# Patient Record
Sex: Male | Born: 1935
Health system: Southern US, Community
[De-identification: ages and names within clinical notes are randomized; demographics above are authoritative.]

## PROBLEM LIST (undated history)

## (undated) DIAGNOSIS — Z87898 Personal history of other specified conditions: Secondary | ICD-10-CM

## (undated) DIAGNOSIS — M5030 Other cervical disc degeneration, unspecified cervical region: Secondary | ICD-10-CM

## (undated) DIAGNOSIS — R001 Bradycardia, unspecified: Secondary | ICD-10-CM

## (undated) DIAGNOSIS — D494 Neoplasm of unspecified behavior of bladder: Secondary | ICD-10-CM

## (undated) DIAGNOSIS — I10 Essential (primary) hypertension: Secondary | ICD-10-CM

## (undated) DIAGNOSIS — K449 Diaphragmatic hernia without obstruction or gangrene: Secondary | ICD-10-CM

## (undated) DIAGNOSIS — E785 Hyperlipidemia, unspecified: Secondary | ICD-10-CM

## (undated) DIAGNOSIS — K4090 Unilateral inguinal hernia, without obstruction or gangrene, not specified as recurrent: Secondary | ICD-10-CM

## (undated) DIAGNOSIS — I480 Paroxysmal atrial fibrillation: Secondary | ICD-10-CM

## (undated) DIAGNOSIS — M1990 Unspecified osteoarthritis, unspecified site: Secondary | ICD-10-CM

## (undated) DIAGNOSIS — I4891 Unspecified atrial fibrillation: Secondary | ICD-10-CM

## (undated) DIAGNOSIS — D649 Anemia, unspecified: Secondary | ICD-10-CM

## (undated) DIAGNOSIS — K635 Polyp of colon: Secondary | ICD-10-CM

## (undated) DIAGNOSIS — K5790 Diverticulosis of intestine, part unspecified, without perforation or abscess without bleeding: Secondary | ICD-10-CM

## (undated) DIAGNOSIS — H3530 Unspecified macular degeneration: Secondary | ICD-10-CM

## (undated) DIAGNOSIS — J449 Chronic obstructive pulmonary disease, unspecified: Secondary | ICD-10-CM

## (undated) DIAGNOSIS — K279 Peptic ulcer, site unspecified, unspecified as acute or chronic, without hemorrhage or perforation: Secondary | ICD-10-CM

## (undated) DIAGNOSIS — K7689 Other specified diseases of liver: Secondary | ICD-10-CM

## (undated) DIAGNOSIS — K219 Gastro-esophageal reflux disease without esophagitis: Secondary | ICD-10-CM

## (undated) DIAGNOSIS — Z7902 Long term (current) use of antithrombotics/antiplatelets: Secondary | ICD-10-CM

## (undated) DIAGNOSIS — E782 Mixed hyperlipidemia: Secondary | ICD-10-CM

## (undated) DIAGNOSIS — Z7901 Long term (current) use of anticoagulants: Secondary | ICD-10-CM

## (undated) DIAGNOSIS — I700 Atherosclerosis of aorta: Secondary | ICD-10-CM

## (undated) DIAGNOSIS — N400 Enlarged prostate without lower urinary tract symptoms: Secondary | ICD-10-CM

## (undated) HISTORY — PX: TONSILLECTOMY: SUR1361

## (undated) HISTORY — PX: OTHER SURGICAL HISTORY: SHX169

## (undated) HISTORY — PX: CHOLECYSTECTOMY: SHX55

## (undated) HISTORY — PX: COLONOSCOPY: SHX174

---

## 2005-09-20 ENCOUNTER — Emergency Department: Payer: Self-pay | Admitting: Emergency Medicine

## 2006-11-17 ENCOUNTER — Ambulatory Visit: Payer: Self-pay | Admitting: Gastroenterology

## 2014-04-07 DIAGNOSIS — E538 Deficiency of other specified B group vitamins: Secondary | ICD-10-CM | POA: Insufficient documentation

## 2014-04-07 DIAGNOSIS — D649 Anemia, unspecified: Secondary | ICD-10-CM | POA: Insufficient documentation

## 2014-04-07 DIAGNOSIS — J449 Chronic obstructive pulmonary disease, unspecified: Secondary | ICD-10-CM | POA: Insufficient documentation

## 2014-04-07 DIAGNOSIS — E785 Hyperlipidemia, unspecified: Secondary | ICD-10-CM | POA: Insufficient documentation

## 2014-04-07 DIAGNOSIS — I10 Essential (primary) hypertension: Secondary | ICD-10-CM | POA: Insufficient documentation

## 2014-05-08 ENCOUNTER — Ambulatory Visit: Payer: Self-pay | Admitting: Internal Medicine

## 2014-05-17 ENCOUNTER — Ambulatory Visit: Payer: Self-pay | Admitting: Surgery

## 2014-05-25 ENCOUNTER — Ambulatory Visit: Payer: Self-pay | Admitting: Surgery

## 2014-05-26 LAB — PATHOLOGY REPORT

## 2014-12-19 ENCOUNTER — Inpatient Hospital Stay: Payer: Self-pay | Admitting: Internal Medicine

## 2014-12-19 LAB — COMPREHENSIVE METABOLIC PANEL
ALBUMIN: 3.5 g/dL (ref 3.4–5.0)
ALK PHOS: 110 U/L
Anion Gap: 7 (ref 7–16)
BUN: 17 mg/dL (ref 7–18)
Bilirubin,Total: 0.3 mg/dL (ref 0.2–1.0)
CALCIUM: 8.9 mg/dL (ref 8.5–10.1)
Chloride: 105 mmol/L (ref 98–107)
Co2: 30 mmol/L (ref 21–32)
Creatinine: 1.05 mg/dL (ref 0.60–1.30)
EGFR (African American): >60
EGFR (Non-African Amer.): >60
Glucose: 97 mg/dL (ref 65–99)
OSMOLALITY: 285 (ref 275–301)
Potassium: 3.7 mmol/L (ref 3.5–5.1)
SGOT(AST): 28 U/L (ref 15–37)
SGPT (ALT): 25 U/L
SODIUM: 142 mmol/L (ref 136–145)
TOTAL PROTEIN: 7.5 g/dL (ref 6.4–8.2)

## 2014-12-19 LAB — CBC
HCT: 48.1 % (ref 40.0–52.0)
HGB: 15.2 g/dL (ref 13.0–18.0)
MCH: 26.8 pg (ref 26.0–34.0)
MCHC: 31.7 g/dL — AB (ref 32.0–36.0)
MCV: 85 fL (ref 80–100)
Platelet: 219 10*3/uL (ref 150–440)
RBC: 5.69 10*6/uL (ref 4.40–5.90)
RDW: 15.5 % — ABNORMAL HIGH (ref 11.5–14.5)
WBC: 8.2 10*3/uL (ref 3.8–10.6)

## 2014-12-19 LAB — CK TOTAL AND CKMB (NOT AT ARMC)
CK, Total: 168 U/L (ref 39–308)
CK-MB: 4.6 ng/mL — ABNORMAL HIGH (ref 0.5–3.6)

## 2014-12-19 LAB — PROTIME-INR
INR: 0.9
Prothrombin Time: 11.9 secs (ref 11.5–14.7)

## 2014-12-19 LAB — TSH: THYROID STIMULATING HORM: 4.85 u[IU]/mL — AB

## 2014-12-19 LAB — T4, FREE: FREE THYROXINE: 1.1 ng/dL (ref 0.76–1.46)

## 2014-12-19 LAB — TROPONIN I
TROPONIN-I: 0.02 ng/mL
Troponin-I: 0.02 ng/mL
Troponin-I: 0.02 ng/mL

## 2014-12-19 LAB — APTT: ACTIVATED PTT: 28.4 s (ref 23.6–35.9)

## 2014-12-19 LAB — HEPARIN LEVEL (UNFRACTIONATED): ANTI-XA(UNFRACTIONATED): 0.39 [IU]/mL (ref 0.30–0.70)

## 2014-12-19 IMAGING — CR DG CHEST 1V PORT
1 series · 1 of 1 positions shown · non-contrast
Comparison: None.

CLINICAL DATA: Tachycardia since this morning. Weakness. Former
smoker.

EXAM:
PORTABLE CHEST - 1 VIEW

[ap]
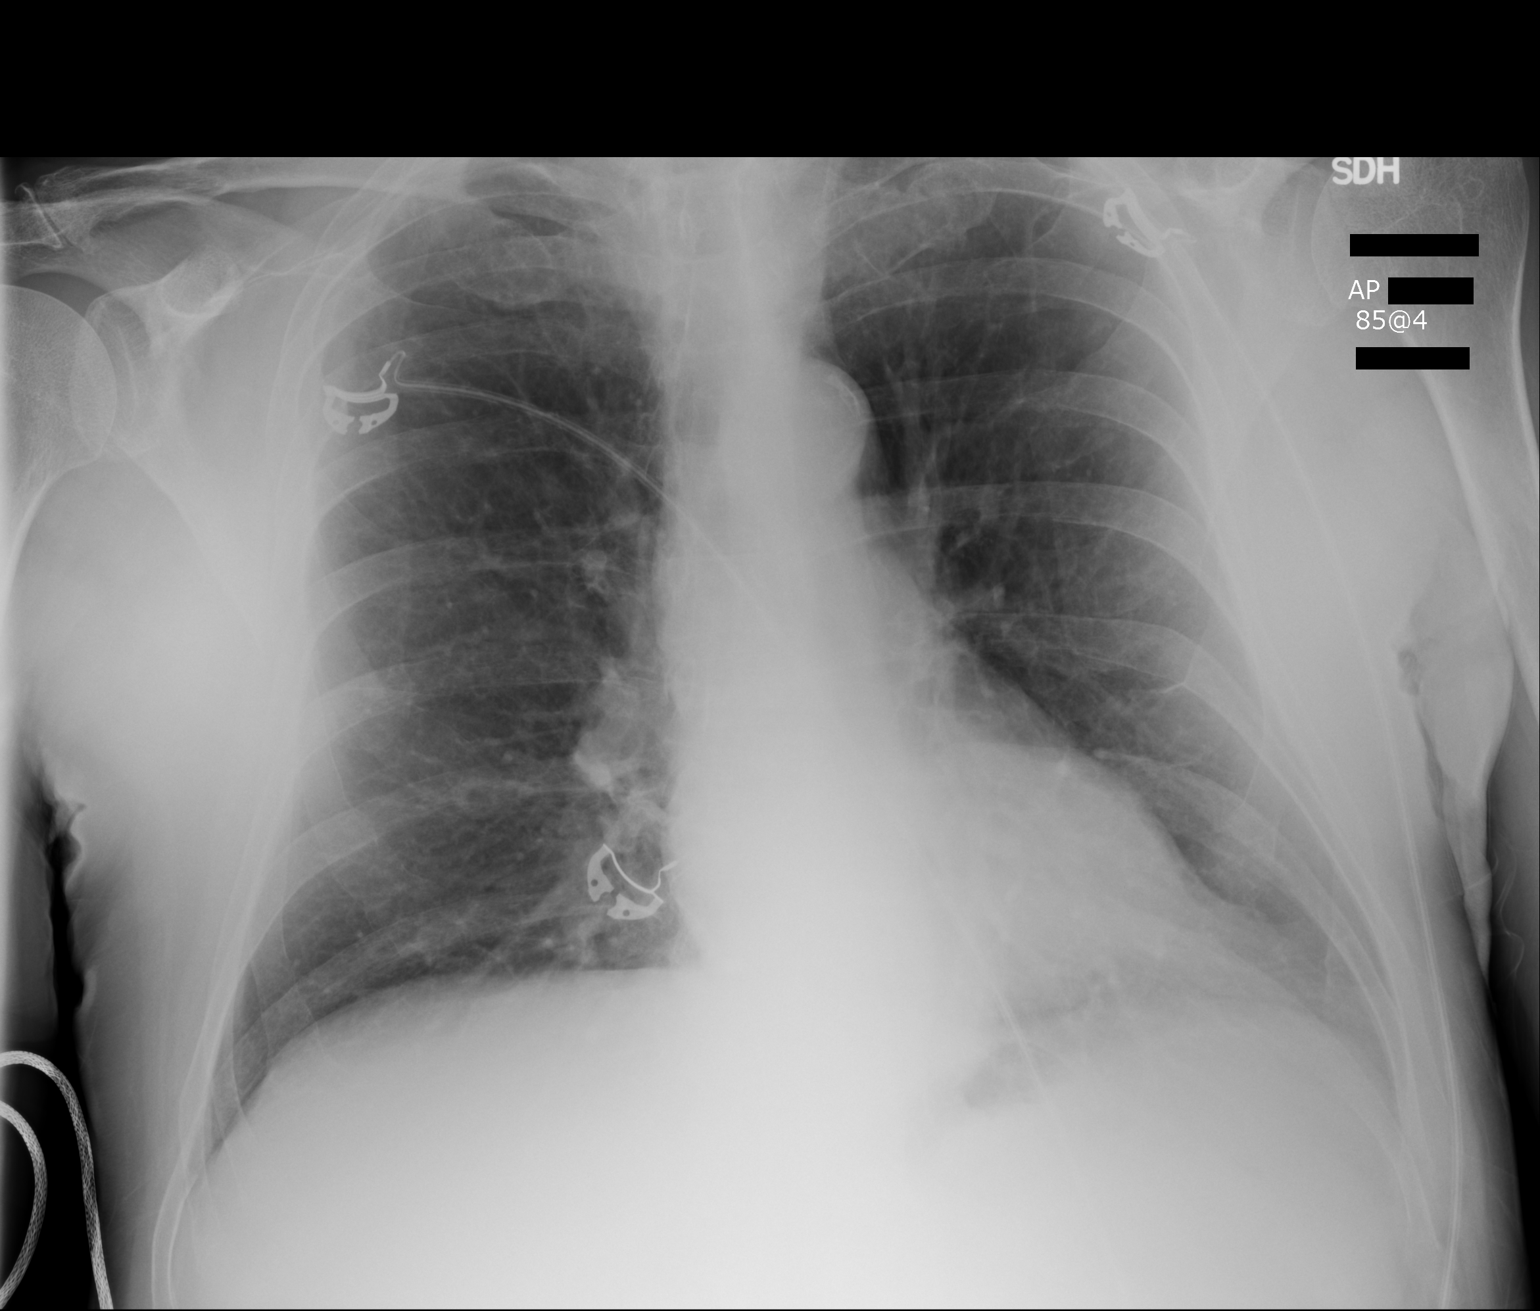

[1 of 1 positions shown; findings below may reference images not displayed]

FINDINGS: The heart size and mediastinal contours are within normal limits.
Both lungs are clear. The visualized skeletal structures are
unremarkable.
IMPRESSION: No active disease.

## 2014-12-20 LAB — CBC WITH DIFFERENTIAL/PLATELET
BASOS ABS: 0.1 10*3/uL (ref 0.0–0.1)
BASOS PCT: 0.6 %
EOS ABS: 0.4 10*3/uL (ref 0.0–0.7)
Eosinophil %: 4.7 %
HCT: 40.7 % (ref 40.0–52.0)
HGB: 13.2 g/dL (ref 13.0–18.0)
Lymphocyte #: 2.5 10*3/uL (ref 1.0–3.6)
Lymphocyte %: 31.9 %
MCH: 26.8 pg (ref 26.0–34.0)
MCHC: 32.4 g/dL (ref 32.0–36.0)
MCV: 83 fL (ref 80–100)
Monocyte #: 0.7 x10 3/mm (ref 0.2–1.0)
Monocyte %: 9.5 %
NEUTROS ABS: 4.1 10*3/uL (ref 1.4–6.5)
Neutrophil %: 53.3 %
Platelet: 173 10*3/uL (ref 150–440)
RBC: 4.92 10*6/uL (ref 4.40–5.90)
RDW: 15.3 % — ABNORMAL HIGH (ref 11.5–14.5)
WBC: 7.8 10*3/uL (ref 3.8–10.6)

## 2014-12-20 LAB — BASIC METABOLIC PANEL
Anion Gap: 6 — ABNORMAL LOW (ref 7–16)
BUN: 16 mg/dL (ref 7–18)
CHLORIDE: 109 mmol/L — AB (ref 98–107)
CO2: 27 mmol/L (ref 21–32)
CREATININE: 0.96 mg/dL (ref 0.60–1.30)
Calcium, Total: 8.2 mg/dL — ABNORMAL LOW (ref 8.5–10.1)
EGFR (African American): >60
EGFR (Non-African Amer.): >60
Glucose: 104 mg/dL — ABNORMAL HIGH (ref 65–99)
Osmolality: 285 (ref 275–301)
POTASSIUM: 3.6 mmol/L (ref 3.5–5.1)
Sodium: 142 mmol/L (ref 136–145)

## 2014-12-20 LAB — HEPARIN LEVEL (UNFRACTIONATED): Anti-Xa(Unfractionated): 0.33 IU/mL (ref 0.30–0.70)

## 2014-12-20 LAB — MAGNESIUM: Magnesium: 1.8 mg/dL

## 2014-12-21 LAB — BASIC METABOLIC PANEL
ANION GAP: 7 (ref 7–16)
BUN: 17 mg/dL (ref 7–18)
CALCIUM: 8 mg/dL — AB (ref 8.5–10.1)
Chloride: 110 mmol/L — ABNORMAL HIGH (ref 98–107)
Co2: 25 mmol/L (ref 21–32)
Creatinine: 1 mg/dL (ref 0.60–1.30)
GLUCOSE: 96 mg/dL (ref 65–99)
OSMOLALITY: 285 (ref 275–301)
POTASSIUM: 3.4 mmol/L — AB (ref 3.5–5.1)
Sodium: 142 mmol/L (ref 136–145)

## 2014-12-28 DIAGNOSIS — I480 Paroxysmal atrial fibrillation: Secondary | ICD-10-CM | POA: Insufficient documentation

## 2015-03-24 NOTE — Op Note (Signed)
PATIENT NAME:  Darren Wright, Darren Wright MR#:  [id number] DATE OF BIRTH:  June 28, 1929  DATE OF PROCEDURE:  05/25/2014  PREOPERATIVE DIAGNOSIS: Chronic cholecystitis/cholelithiasis.   POSTOPERATIVE DIAGNOSIS:  Chronic cholecystitis/cholelithiasis.  PROCEDURES: Laparoscopic cholecystectomy and cholangiogram.   SURGEON: Rochel Brome, M.D.   ANESTHESIA: General.   INDICATIONS: This 81 year old male has had 5 episodes of epigastric pain and ultrasound findings of gallstones. Surgery was recommended for definitive treatment.   DESCRIPTION OF PROCEDURE: The patient was placed on the operating table in the supine position under general endotracheal anesthesia. The abdomen was prepared with ChloraPrep and draped in a sterile manner.   A short incision was made in the inferior aspect of the umbilicus and carried down to the deep fascia, which was grasped with laryngeal hook and elevated. A Veress needle was inserted, aspirated, and irrigated with a saline solution.  Next, the peritoneal cavity was inflated with carbon dioxide. The Veress needle was removed. The 10 mm cannula was inserted. The 10 mm, 0 degrees laparoscope was inserted to view the peritoneal cavity. The liver appeared normal. Brief survey revealed no other abnormality. Another incision was made in the epigastrium, slightly to the right of the midline to introduce an 11 mm cannula. Two 5 mm cannulas were inserted through small incisions in the lateral aspect of the right upper quadrant.   With the patient in the reverse Trendelenburg position and turned several degrees to the left, the gallbladder was retracted towards the right shoulder. A number of adhesions between the gallbladder and the liver where were dissected with hook and cautery. The infundibulum was retracted inferiorly and laterally. The location of the porta hepatis was demonstrated. Dissection was carried out to mobilize the neck of the gallbladder with incision on the visceral peritoneum.  The cystic duct was dissected free from surrounding structures. The cystic artery was dissected free from surrounding structures. A critical view of safety was demonstrated. Endoclips were placed on the cystic artery and divided. This allowed better traction on the cystic duct. An endoclip was placed across the cystic duct. An incision was made in the cystic duct to introduce a Reddick catheter. Half-strength Conray-60 dye was injected as the cholangiogram was done with fluoroscopy. There was some diffuse dilatation of the common bile duct and some reflux into the biliary tree. There was prompt flow of dye into the duodenum. No retained stones were seen. The Reddick catheter was removed. The cystic duct was doubly ligated with endoclips and divided. The gallbladder was dissected free from the liver with hook and cautery. The gallbladder was delivered up through the infraumbilical incision, opened and suctioned. A number of stones were removed with the stone forceps. The gallbladder was removed and submitted in formalin with stones for routine pathology. The right upper quadrant was further inspected, irrigated, and aspirated. Hemostasis was intact. The cannulas were removed allowing carbon dioxide to escape from the peritoneal cavity. Skin incisions were closed with interrupted 5-0 chromic subcuticular sutures, benzoin, and Steri-Strips. Dressings were applied with paper tape. The patient tolerated surgery satisfactorily and was then prepared for transfer to the recovery room.   ____________________________ Lenna Sciara. Rochel Brome, MD jws:ts D: 05/25/2014 08:51:00 ET T: 05/25/2014 13:36:18 ET JOB#: [id number]  cc: Loreli Dollar, MD, <Dictator> Loreli Dollar MD ELECTRONICALLY SIGNED 05/25/2014 18:18

## 2015-04-01 NOTE — H&P (Signed)
PATIENT NAME:  Darren Wright, Darren Wright MR#:  [id number] DATE OF BIRTH:  06-28-1929  DATE OF ADMISSION:  12/19/2014  PRIMARY CARE PHYSICIAN:  Felipa Furnace, MD   REQUESTING PHYSICIAN: Lenise Arena, MD.   CHIEF COMPLAINT:  Palpitation.   HISTORY OF PRESENT ILLNESS: The patient is an 81 year old male with a known history of hypertension, is being admitted for new onset atrial fibrillation-flutter.  The patient woke up this morning with a feeling of hot racing, he could feel the pulse in his head, checked his blood pressure and oxygen, and heart rate.  His heart rate normally is between 50 to 60, but this morning it was around 140. His oxygen level was at 98. His blood pressure was stable. His wife drove him to the Emergency Department considering such a fast heart rate as normal he is in the 50s and 70s.  While in the ED, he was still 137 to 138 per minute, and he is being admitted for further evaluation and management.  He received 10 mg of Cardizem IV first around 9:30 in the Emergency Department, still did not change, got another 20 mg IV Cardizem push around 9:45 which did control his rate and he is now being admitted for further evaluation and management. He denies any chest pain or shortness of breath.   PAST MEDICAL HISTORY:  Hypertension.   PAST SURGICAL HISTORY:  Cholecystectomy about 3 months ago.   SOCIAL HISTORY: No smoking, he quit smoking in 1985.  No alcohol. He used to work as a Dance movement psychotherapist.   FAMILY HISTORY: Father with a history of prostate cancer. Mother with a history of pancreatic cancer.   ALLERGIES: IBUPROFEN AND PENICILLIN.   MEDICATIONS AT HOME:  1.  Aspirin 81 mg p.o. daily. 2.  Benazepril-hydrochlorothiazide 20/12.5 mg 1 tablet p.o. b.i.d.  3.  Dry Eye ophthalmic solution 1 drop to each affected eye 4 times a day as needed.  4.  Metoprolol 50 mg p.o. daily.  5.  PreserVision 1 capsule p.o. b.i.d.  6.  Timolol eye drops 0.5% to each affected eye daily.  7.   Vitamin B12 at 1000 mcg p.o. daily.    REVIEW OF SYSTEMS:  CONSTITUTIONAL: No fever, fatigue, weakness.  EYES: No blurred or double vision.  ENT: No tinnitus or ear pain.  RESPIRATORY: No cough, wheezing, hemoptysis.  CARDIOVASCULAR: No chest pain, orthopnea, edema. Positive for palpitations.  GASTROINTESTINAL: No nausea, vomiting, diarrhea.  GENITOURINARY:  No dysuria, hematuria. ENDOCRINE:  No polyuria, nocturia.   HEMATOLOGY:  No easy bruising or bleeding. SKIN: No rash or lesion.  MUSCULOSKELETAL: No arthritis or muscle cramp.  NEUROLOGIC: No tingling, numbness, weakness.  PSYCHIATRY: No history of anxiety or depression.   PHYSICAL EXAMINATION:  VITAL SIGNS: Temperature 97.5, heart rate was up to 137 per minute, now it is about 95 per minute, respirations 20 per minute, blood pressure 129/71. He is saturating 99% on room air.  GENERAL: The patient is an 81 year old male lying in the bed comfortably without any acute distress.  EYES: Pupils equal, round, reactive to light and accommodation. No scleral icterus. Extraocular muscles intact.  HEENT: Head atraumatic, normocephalic. Oropharynx and nasopharynx clear.  NECK: Supple.  No JVD, No thyroid enalargement or tenderness  LUNGS: Clear to auscultation bilaterally. No wheezing, rales, rhonchi, or crepitation.  CARDIOVASCULAR: Irregularly irregular heart sounds, no murmurs, rubs or gallops.   ABDOMEN: Soft, nontender, nondistended. Bowel sounds present. No organomegaly or mass.  EXTREMITIES: No pedal edema, cyanosis, or clubbing.  NEUROLOGIC: Cranial  nerves II through XII intact, muscle strength 5/5 in all extremities.  Sensation intact.  PSYCHIATRIC: The patient is alert and oriented x 3.   SKIN: No obvious rash, lesion, or ulcer.  MUSCULOSKELETAL: No joint effusion or tenderness. Normal muscle tone.    LABORATORY DATA:  Normal BMP. Normal liver function tests. Normal CBC. Normal first set of cardiac enzymes. TSH was elevated with a  value of 4.85, but free T4, is 1.10.  Coagulation panel within normal limits.   Chest x-ray in the Emergency Department showed no acute cardiopulmonary disease.  EKG initially showed worsening sinus tachycardia, rate of 138 per minute.  Repeat EKG showed atrial flutter with a rate of 104 per minute, is still running in aflutter, but with much controlled rate of the mid-90s.   IMPRESSION AND PLAN:  1.  New onset atrial fibrillation/flutter controlled by 2 pushes of IV Cardizem.  We will go ahead and start him on metoprolol at this time. Consult cardiology Dr. Saralyn Pilar and the case was discussed with him, who will see him this evening.  We will get a 2D echocardiogram, check TSH. Continue on heparin drip, which was already started in the Emergency Department. He may need long-term anticoagulation. We will monitor him on telemetry.  2.  Hypertension. We will continue his home medication, add metoprolol.  3.  Elevated TSH, likely euthyroid sick syndrome as free T4 is within normal limits   CODE STATUS: FULL CODE.   TOTAL TIME TAKING CARE OF THIS PATIENT:  55 minutes.      ____________________________ Lucina Mellow. Manuella Ghazi, MD vss:DT D: 12/19/2014 15:19:54 ET T: 12/19/2014 16:09:39 ET JOB#: [id number]  cc: Saanya Zieske S. Manuella Ghazi, MD, <Dictator> Leonie Douglas. Doy Hutching, MD  Remer Macho MD ELECTRONICALLY SIGNED 12/21/2014 10:14

## 2015-04-01 NOTE — Consult Note (Signed)
PATIENT NAME:  Darren Wright, Darren Wright MR#:  [id number] DATE OF BIRTH:  1929/01/02  DATE OF CONSULTATION:  12/19/2014  PRIMARY CARE PHYSICIAN:  Dr. Doy Hutching.  REFERRING PHYSICIAN:  Dr. Manuella Ghazi.   CONSULTING PHYSICIAN:  Isaias Cowman, MD  CHIEF COMPLAINT:  Palpitations.   REASON FOR CONSULTATION: Consultation requested for evaluation of atrial fibrillation.   HISTORY OF PRESENT ILLNESS: The patient is an 81 year old gentleman with history of hypertension and hyperlipidemia, who presents to [facility] Emergency Room with tachycardia and atrial fibrillation. The patient reports that he was in his usual state of health until early this a.m. when he got up out of bed and noted lightheadedness and palpitations. He presented to [facility] Emergency Room where he was noted to be in atrial fibrillation with a rapid ventricular rate. The patient was started on heparin drip. The patient denies chest pain or shortness of breath. Admission labs were notable for negative troponin of 0.02.   PAST MEDICAL HISTORY:  1.  Hypertension.  2.  Hyperlipidemia.  3.  Gastroesophageal reflux disease.  4.  Benign prostatic hypertrophy.  5.  Degenerative disk disease.  6.  Osteoarthritis.  7.  Chronic obstructive pulmonary disease.   MEDICATIONS: Aspirin 81 mg daily, benazepril 12.5 mg b.i.d., metoprolol succinate 1 tablet daily, cyanocobalamin 1000 mcg daily.   SOCIAL HISTORY: The patient is married and resides with his wife.  He has a remote tobacco abuse history.   FAMILY HISTORY: No immediate family history of coronary artery disease or myocardial infarction.   REVIEW OF SYSTEMS:     CONSTITUTIONAL: No fever or chills.  EYES: No blurry vision.  EARS: No hearing loss.  RESPIRATORY: No shortness of breath.  CARDIOVASCULAR: Palpitations as described above.  GASTROINTESTINAL: No nausea, vomiting, or diarrhea.  GENITOURINARY: No dysuria or hematuria.  ENDOCRINE: No polyuria or polydipsia.  MUSCULOSKELETAL: No arthralgias or  myalgias.  NEUROLOGICAL: No focal muscle weakness or numbness.  PSYCHOLOGICAL: No depression or anxiety.   PHYSICAL EXAMINATION:  VITAL SIGNS: Blood pressure 129/71, pulse 95, respirations 20, temperature 97.5, pulse oximetry 97%.  HEENT: Pupils equal, reactive to light and accommodation.  NECK: Supple without thyromegaly.  LUNGS: Clear.  CARDIOVASCULAR:  Normal JVP. Normal PMI. Regular rate and rhythm. Irregularly irregular rhythm. Normal S1, S2. No appreciable gallop, murmur, or rub.  ABDOMEN: Soft and nontender. Pulses were intact bilaterally.  MUSCULOSKELETAL: Normal muscle tone.  NEUROLOGIC: The patient is alert and oriented x3. Motor and sensory both grossly intact.   IMPRESSION: An 81 year old gentleman, who presents with new onset atrial fibrillation, currently appears hemodynamically stable, without chest pain with negative troponin.   RECOMMENDATIONS:  1.  Agree with overall current therapy.  2.  Resume metoprolol succinate for rate as well as blood pressure control.  3.  Review 2-D echocardiogram.  4.  Further recommendations pending echocardiogram results.     ____________________________ Isaias Cowman, MD ap:at D: 12/19/2014 13:28:33 ET T: 12/19/2014 15:56:41 ET JOB#: [id number]  cc: Isaias Cowman, MD, <Dictator> Isaias Cowman MD ELECTRONICALLY SIGNED 12/20/2014 18:07

## 2015-04-05 DIAGNOSIS — R5382 Chronic fatigue, unspecified: Secondary | ICD-10-CM | POA: Insufficient documentation

## 2015-04-05 DIAGNOSIS — R001 Bradycardia, unspecified: Secondary | ICD-10-CM | POA: Insufficient documentation

## 2015-12-12 DIAGNOSIS — H353212 Exudative age-related macular degeneration, right eye, with inactive choroidal neovascularization: Secondary | ICD-10-CM | POA: Diagnosis not present

## 2016-01-03 DIAGNOSIS — D649 Anemia, unspecified: Secondary | ICD-10-CM | POA: Diagnosis not present

## 2016-01-03 DIAGNOSIS — E7800 Pure hypercholesterolemia, unspecified: Secondary | ICD-10-CM | POA: Diagnosis not present

## 2016-01-03 DIAGNOSIS — E538 Deficiency of other specified B group vitamins: Secondary | ICD-10-CM | POA: Diagnosis not present

## 2016-01-03 DIAGNOSIS — J431 Panlobular emphysema: Secondary | ICD-10-CM | POA: Diagnosis not present

## 2016-01-03 DIAGNOSIS — Z125 Encounter for screening for malignant neoplasm of prostate: Secondary | ICD-10-CM | POA: Diagnosis not present

## 2016-01-03 DIAGNOSIS — Z79899 Other long term (current) drug therapy: Secondary | ICD-10-CM | POA: Diagnosis not present

## 2016-01-03 DIAGNOSIS — I10 Essential (primary) hypertension: Secondary | ICD-10-CM | POA: Diagnosis not present

## 2016-01-09 DIAGNOSIS — H353221 Exudative age-related macular degeneration, left eye, with active choroidal neovascularization: Secondary | ICD-10-CM | POA: Diagnosis not present

## 2016-02-05 DIAGNOSIS — H40003 Preglaucoma, unspecified, bilateral: Secondary | ICD-10-CM | POA: Diagnosis not present

## 2016-02-06 DIAGNOSIS — E538 Deficiency of other specified B group vitamins: Secondary | ICD-10-CM | POA: Diagnosis not present

## 2016-02-06 DIAGNOSIS — I10 Essential (primary) hypertension: Secondary | ICD-10-CM | POA: Diagnosis not present

## 2016-02-06 DIAGNOSIS — J410 Simple chronic bronchitis: Secondary | ICD-10-CM | POA: Diagnosis not present

## 2016-02-06 DIAGNOSIS — Z79899 Other long term (current) drug therapy: Secondary | ICD-10-CM | POA: Diagnosis not present

## 2016-02-06 DIAGNOSIS — E7800 Pure hypercholesterolemia, unspecified: Secondary | ICD-10-CM | POA: Diagnosis not present

## 2016-02-06 DIAGNOSIS — Z125 Encounter for screening for malignant neoplasm of prostate: Secondary | ICD-10-CM | POA: Diagnosis not present

## 2016-02-06 DIAGNOSIS — R001 Bradycardia, unspecified: Secondary | ICD-10-CM | POA: Diagnosis not present

## 2016-02-11 DIAGNOSIS — H40003 Preglaucoma, unspecified, bilateral: Secondary | ICD-10-CM | POA: Diagnosis not present

## 2016-02-18 DIAGNOSIS — H353221 Exudative age-related macular degeneration, left eye, with active choroidal neovascularization: Secondary | ICD-10-CM | POA: Diagnosis not present

## 2016-02-20 DIAGNOSIS — H353212 Exudative age-related macular degeneration, right eye, with inactive choroidal neovascularization: Secondary | ICD-10-CM | POA: Diagnosis not present

## 2016-04-07 DIAGNOSIS — H353221 Exudative age-related macular degeneration, left eye, with active choroidal neovascularization: Secondary | ICD-10-CM | POA: Diagnosis not present

## 2016-04-21 DIAGNOSIS — H353221 Exudative age-related macular degeneration, left eye, with active choroidal neovascularization: Secondary | ICD-10-CM | POA: Diagnosis not present

## 2016-05-02 DIAGNOSIS — N138 Other obstructive and reflux uropathy: Secondary | ICD-10-CM | POA: Diagnosis not present

## 2016-05-02 DIAGNOSIS — N401 Enlarged prostate with lower urinary tract symptoms: Secondary | ICD-10-CM | POA: Diagnosis not present

## 2016-05-02 DIAGNOSIS — J410 Simple chronic bronchitis: Secondary | ICD-10-CM | POA: Diagnosis not present

## 2016-05-02 DIAGNOSIS — Z79899 Other long term (current) drug therapy: Secondary | ICD-10-CM | POA: Diagnosis not present

## 2016-05-02 DIAGNOSIS — E538 Deficiency of other specified B group vitamins: Secondary | ICD-10-CM | POA: Diagnosis not present

## 2016-05-02 DIAGNOSIS — E7800 Pure hypercholesterolemia, unspecified: Secondary | ICD-10-CM | POA: Diagnosis not present

## 2016-05-02 DIAGNOSIS — D649 Anemia, unspecified: Secondary | ICD-10-CM | POA: Diagnosis not present

## 2016-05-02 DIAGNOSIS — I10 Essential (primary) hypertension: Secondary | ICD-10-CM | POA: Diagnosis not present

## 2016-05-02 DIAGNOSIS — Z125 Encounter for screening for malignant neoplasm of prostate: Secondary | ICD-10-CM | POA: Diagnosis not present

## 2016-05-14 DIAGNOSIS — H353221 Exudative age-related macular degeneration, left eye, with active choroidal neovascularization: Secondary | ICD-10-CM | POA: Diagnosis not present

## 2016-06-10 DIAGNOSIS — H353211 Exudative age-related macular degeneration, right eye, with active choroidal neovascularization: Secondary | ICD-10-CM | POA: Diagnosis not present

## 2016-06-13 DIAGNOSIS — H353221 Exudative age-related macular degeneration, left eye, with active choroidal neovascularization: Secondary | ICD-10-CM | POA: Diagnosis not present

## 2016-08-01 DIAGNOSIS — H353221 Exudative age-related macular degeneration, left eye, with active choroidal neovascularization: Secondary | ICD-10-CM | POA: Diagnosis not present

## 2016-08-11 DIAGNOSIS — H353221 Exudative age-related macular degeneration, left eye, with active choroidal neovascularization: Secondary | ICD-10-CM | POA: Diagnosis not present

## 2016-08-11 DIAGNOSIS — H40003 Preglaucoma, unspecified, bilateral: Secondary | ICD-10-CM | POA: Diagnosis not present

## 2016-08-22 DIAGNOSIS — J410 Simple chronic bronchitis: Secondary | ICD-10-CM | POA: Diagnosis not present

## 2016-08-22 DIAGNOSIS — R001 Bradycardia, unspecified: Secondary | ICD-10-CM | POA: Diagnosis not present

## 2016-08-22 DIAGNOSIS — I480 Paroxysmal atrial fibrillation: Secondary | ICD-10-CM | POA: Diagnosis not present

## 2016-08-22 DIAGNOSIS — E7800 Pure hypercholesterolemia, unspecified: Secondary | ICD-10-CM | POA: Diagnosis not present

## 2016-08-22 DIAGNOSIS — I10 Essential (primary) hypertension: Secondary | ICD-10-CM | POA: Diagnosis not present

## 2016-08-27 DIAGNOSIS — I10 Essential (primary) hypertension: Secondary | ICD-10-CM | POA: Diagnosis not present

## 2016-08-27 DIAGNOSIS — Z79899 Other long term (current) drug therapy: Secondary | ICD-10-CM | POA: Diagnosis not present

## 2016-08-27 DIAGNOSIS — Z125 Encounter for screening for malignant neoplasm of prostate: Secondary | ICD-10-CM | POA: Diagnosis not present

## 2016-08-27 DIAGNOSIS — E7800 Pure hypercholesterolemia, unspecified: Secondary | ICD-10-CM | POA: Diagnosis not present

## 2016-09-03 DIAGNOSIS — E7800 Pure hypercholesterolemia, unspecified: Secondary | ICD-10-CM | POA: Diagnosis not present

## 2016-09-03 DIAGNOSIS — Z79899 Other long term (current) drug therapy: Secondary | ICD-10-CM | POA: Diagnosis not present

## 2016-09-03 DIAGNOSIS — R002 Palpitations: Secondary | ICD-10-CM | POA: Diagnosis not present

## 2016-09-03 DIAGNOSIS — D649 Anemia, unspecified: Secondary | ICD-10-CM | POA: Diagnosis not present

## 2016-09-03 DIAGNOSIS — Z0000 Encounter for general adult medical examination without abnormal findings: Secondary | ICD-10-CM | POA: Diagnosis not present

## 2016-09-03 DIAGNOSIS — N401 Enlarged prostate with lower urinary tract symptoms: Secondary | ICD-10-CM | POA: Diagnosis not present

## 2016-09-03 DIAGNOSIS — N138 Other obstructive and reflux uropathy: Secondary | ICD-10-CM | POA: Diagnosis not present

## 2016-09-03 DIAGNOSIS — I10 Essential (primary) hypertension: Secondary | ICD-10-CM | POA: Diagnosis not present

## 2016-09-03 DIAGNOSIS — K219 Gastro-esophageal reflux disease without esophagitis: Secondary | ICD-10-CM | POA: Diagnosis not present

## 2016-09-03 DIAGNOSIS — I480 Paroxysmal atrial fibrillation: Secondary | ICD-10-CM | POA: Diagnosis not present

## 2016-09-03 DIAGNOSIS — J410 Simple chronic bronchitis: Secondary | ICD-10-CM | POA: Diagnosis not present

## 2016-09-26 DIAGNOSIS — H353221 Exudative age-related macular degeneration, left eye, with active choroidal neovascularization: Secondary | ICD-10-CM | POA: Diagnosis not present

## 2016-10-03 DIAGNOSIS — H353211 Exudative age-related macular degeneration, right eye, with active choroidal neovascularization: Secondary | ICD-10-CM | POA: Diagnosis not present

## 2016-11-14 DIAGNOSIS — H353221 Exudative age-related macular degeneration, left eye, with active choroidal neovascularization: Secondary | ICD-10-CM | POA: Diagnosis not present

## 2016-12-01 HISTORY — PX: CHOLECYSTECTOMY: SHX55

## 2016-12-09 DIAGNOSIS — H353211 Exudative age-related macular degeneration, right eye, with active choroidal neovascularization: Secondary | ICD-10-CM | POA: Diagnosis not present

## 2016-12-11 DIAGNOSIS — D692 Other nonthrombocytopenic purpura: Secondary | ICD-10-CM | POA: Diagnosis not present

## 2016-12-11 DIAGNOSIS — Z1283 Encounter for screening for malignant neoplasm of skin: Secondary | ICD-10-CM | POA: Diagnosis not present

## 2016-12-11 DIAGNOSIS — Z85828 Personal history of other malignant neoplasm of skin: Secondary | ICD-10-CM | POA: Diagnosis not present

## 2016-12-11 DIAGNOSIS — C4491 Basal cell carcinoma of skin, unspecified: Secondary | ICD-10-CM

## 2016-12-11 DIAGNOSIS — L918 Other hypertrophic disorders of the skin: Secondary | ICD-10-CM | POA: Diagnosis not present

## 2016-12-11 DIAGNOSIS — C44319 Basal cell carcinoma of skin of other parts of face: Secondary | ICD-10-CM | POA: Diagnosis not present

## 2016-12-11 DIAGNOSIS — L821 Other seborrheic keratosis: Secondary | ICD-10-CM | POA: Diagnosis not present

## 2016-12-11 DIAGNOSIS — L570 Actinic keratosis: Secondary | ICD-10-CM | POA: Diagnosis not present

## 2016-12-11 DIAGNOSIS — L578 Other skin changes due to chronic exposure to nonionizing radiation: Secondary | ICD-10-CM | POA: Diagnosis not present

## 2016-12-11 DIAGNOSIS — D485 Neoplasm of uncertain behavior of skin: Secondary | ICD-10-CM | POA: Diagnosis not present

## 2016-12-11 DIAGNOSIS — D1800 Hemangioma unspecified site: Secondary | ICD-10-CM | POA: Diagnosis not present

## 2016-12-11 DIAGNOSIS — L812 Freckles: Secondary | ICD-10-CM | POA: Diagnosis not present

## 2016-12-11 HISTORY — DX: Basal cell carcinoma of skin, unspecified: C44.91

## 2017-01-09 DIAGNOSIS — H353221 Exudative age-related macular degeneration, left eye, with active choroidal neovascularization: Secondary | ICD-10-CM | POA: Diagnosis not present

## 2017-02-03 DIAGNOSIS — H353211 Exudative age-related macular degeneration, right eye, with active choroidal neovascularization: Secondary | ICD-10-CM | POA: Diagnosis not present

## 2017-02-09 DIAGNOSIS — H40003 Preglaucoma, unspecified, bilateral: Secondary | ICD-10-CM | POA: Diagnosis not present

## 2017-02-16 DIAGNOSIS — H40003 Preglaucoma, unspecified, bilateral: Secondary | ICD-10-CM | POA: Diagnosis not present

## 2017-02-25 DIAGNOSIS — I10 Essential (primary) hypertension: Secondary | ICD-10-CM | POA: Diagnosis not present

## 2017-02-25 DIAGNOSIS — E7800 Pure hypercholesterolemia, unspecified: Secondary | ICD-10-CM | POA: Diagnosis not present

## 2017-02-25 DIAGNOSIS — Z79899 Other long term (current) drug therapy: Secondary | ICD-10-CM | POA: Diagnosis not present

## 2017-03-03 DIAGNOSIS — I480 Paroxysmal atrial fibrillation: Secondary | ICD-10-CM | POA: Diagnosis not present

## 2017-03-03 DIAGNOSIS — E785 Hyperlipidemia, unspecified: Secondary | ICD-10-CM | POA: Diagnosis not present

## 2017-03-03 DIAGNOSIS — R001 Bradycardia, unspecified: Secondary | ICD-10-CM | POA: Diagnosis not present

## 2017-03-03 DIAGNOSIS — I10 Essential (primary) hypertension: Secondary | ICD-10-CM | POA: Diagnosis not present

## 2017-03-03 DIAGNOSIS — J449 Chronic obstructive pulmonary disease, unspecified: Secondary | ICD-10-CM | POA: Diagnosis not present

## 2017-03-10 DIAGNOSIS — I10 Essential (primary) hypertension: Secondary | ICD-10-CM | POA: Diagnosis not present

## 2017-03-10 DIAGNOSIS — Z0000 Encounter for general adult medical examination without abnormal findings: Secondary | ICD-10-CM | POA: Diagnosis not present

## 2017-03-10 DIAGNOSIS — Z125 Encounter for screening for malignant neoplasm of prostate: Secondary | ICD-10-CM | POA: Diagnosis not present

## 2017-03-10 DIAGNOSIS — J449 Chronic obstructive pulmonary disease, unspecified: Secondary | ICD-10-CM | POA: Diagnosis not present

## 2017-03-10 DIAGNOSIS — Z79899 Other long term (current) drug therapy: Secondary | ICD-10-CM | POA: Diagnosis not present

## 2017-03-10 DIAGNOSIS — E785 Hyperlipidemia, unspecified: Secondary | ICD-10-CM | POA: Diagnosis not present

## 2017-03-10 DIAGNOSIS — I480 Paroxysmal atrial fibrillation: Secondary | ICD-10-CM | POA: Diagnosis not present

## 2017-03-13 DIAGNOSIS — H353221 Exudative age-related macular degeneration, left eye, with active choroidal neovascularization: Secondary | ICD-10-CM | POA: Diagnosis not present

## 2017-03-31 DIAGNOSIS — H353211 Exudative age-related macular degeneration, right eye, with active choroidal neovascularization: Secondary | ICD-10-CM | POA: Diagnosis not present

## 2017-03-31 DIAGNOSIS — H353221 Exudative age-related macular degeneration, left eye, with active choroidal neovascularization: Secondary | ICD-10-CM | POA: Diagnosis not present

## 2017-04-21 DIAGNOSIS — C44319 Basal cell carcinoma of skin of other parts of face: Secondary | ICD-10-CM | POA: Diagnosis not present

## 2017-04-21 DIAGNOSIS — Z85828 Personal history of other malignant neoplasm of skin: Secondary | ICD-10-CM | POA: Insufficient documentation

## 2017-06-01 DIAGNOSIS — Z85828 Personal history of other malignant neoplasm of skin: Secondary | ICD-10-CM | POA: Diagnosis not present

## 2017-06-01 DIAGNOSIS — L570 Actinic keratosis: Secondary | ICD-10-CM | POA: Diagnosis not present

## 2017-06-01 DIAGNOSIS — L578 Other skin changes due to chronic exposure to nonionizing radiation: Secondary | ICD-10-CM | POA: Diagnosis not present

## 2017-06-02 DIAGNOSIS — H353221 Exudative age-related macular degeneration, left eye, with active choroidal neovascularization: Secondary | ICD-10-CM | POA: Diagnosis not present

## 2017-06-09 DIAGNOSIS — H353211 Exudative age-related macular degeneration, right eye, with active choroidal neovascularization: Secondary | ICD-10-CM | POA: Diagnosis not present

## 2017-08-25 DIAGNOSIS — H40003 Preglaucoma, unspecified, bilateral: Secondary | ICD-10-CM | POA: Diagnosis not present

## 2017-09-01 DIAGNOSIS — H353211 Exudative age-related macular degeneration, right eye, with active choroidal neovascularization: Secondary | ICD-10-CM | POA: Diagnosis not present

## 2017-09-01 DIAGNOSIS — H353221 Exudative age-related macular degeneration, left eye, with active choroidal neovascularization: Secondary | ICD-10-CM | POA: Diagnosis not present

## 2017-09-02 DIAGNOSIS — E785 Hyperlipidemia, unspecified: Secondary | ICD-10-CM | POA: Diagnosis not present

## 2017-09-02 DIAGNOSIS — I10 Essential (primary) hypertension: Secondary | ICD-10-CM | POA: Diagnosis not present

## 2017-09-02 DIAGNOSIS — Z125 Encounter for screening for malignant neoplasm of prostate: Secondary | ICD-10-CM | POA: Diagnosis not present

## 2017-09-02 DIAGNOSIS — Z79899 Other long term (current) drug therapy: Secondary | ICD-10-CM | POA: Diagnosis not present

## 2017-09-11 DIAGNOSIS — Z79899 Other long term (current) drug therapy: Secondary | ICD-10-CM | POA: Diagnosis not present

## 2017-09-11 DIAGNOSIS — I480 Paroxysmal atrial fibrillation: Secondary | ICD-10-CM | POA: Diagnosis not present

## 2017-09-11 DIAGNOSIS — Z125 Encounter for screening for malignant neoplasm of prostate: Secondary | ICD-10-CM | POA: Diagnosis not present

## 2017-09-11 DIAGNOSIS — E538 Deficiency of other specified B group vitamins: Secondary | ICD-10-CM | POA: Diagnosis not present

## 2017-09-11 DIAGNOSIS — E785 Hyperlipidemia, unspecified: Secondary | ICD-10-CM | POA: Diagnosis not present

## 2017-09-11 DIAGNOSIS — J449 Chronic obstructive pulmonary disease, unspecified: Secondary | ICD-10-CM | POA: Diagnosis not present

## 2017-09-11 DIAGNOSIS — I10 Essential (primary) hypertension: Secondary | ICD-10-CM | POA: Diagnosis not present

## 2017-09-11 DIAGNOSIS — D649 Anemia, unspecified: Secondary | ICD-10-CM | POA: Diagnosis not present

## 2017-09-22 DIAGNOSIS — I480 Paroxysmal atrial fibrillation: Secondary | ICD-10-CM | POA: Diagnosis not present

## 2017-09-22 DIAGNOSIS — I10 Essential (primary) hypertension: Secondary | ICD-10-CM | POA: Diagnosis not present

## 2017-09-22 DIAGNOSIS — E785 Hyperlipidemia, unspecified: Secondary | ICD-10-CM | POA: Diagnosis not present

## 2017-09-22 DIAGNOSIS — R001 Bradycardia, unspecified: Secondary | ICD-10-CM | POA: Diagnosis not present

## 2017-09-22 DIAGNOSIS — J449 Chronic obstructive pulmonary disease, unspecified: Secondary | ICD-10-CM | POA: Diagnosis not present

## 2017-11-16 DIAGNOSIS — Z79899 Other long term (current) drug therapy: Secondary | ICD-10-CM | POA: Diagnosis not present

## 2017-11-16 DIAGNOSIS — Z0000 Encounter for general adult medical examination without abnormal findings: Secondary | ICD-10-CM | POA: Diagnosis not present

## 2017-11-16 DIAGNOSIS — Z125 Encounter for screening for malignant neoplasm of prostate: Secondary | ICD-10-CM | POA: Diagnosis not present

## 2017-11-16 DIAGNOSIS — I10 Essential (primary) hypertension: Secondary | ICD-10-CM | POA: Diagnosis not present

## 2017-11-16 DIAGNOSIS — E785 Hyperlipidemia, unspecified: Secondary | ICD-10-CM | POA: Diagnosis not present

## 2017-11-16 DIAGNOSIS — R972 Elevated prostate specific antigen [PSA]: Secondary | ICD-10-CM | POA: Diagnosis not present

## 2017-11-16 DIAGNOSIS — D649 Anemia, unspecified: Secondary | ICD-10-CM | POA: Diagnosis not present

## 2017-12-03 DIAGNOSIS — Z85828 Personal history of other malignant neoplasm of skin: Secondary | ICD-10-CM | POA: Diagnosis not present

## 2017-12-03 DIAGNOSIS — L821 Other seborrheic keratosis: Secondary | ICD-10-CM | POA: Diagnosis not present

## 2017-12-03 DIAGNOSIS — L570 Actinic keratosis: Secondary | ICD-10-CM | POA: Diagnosis not present

## 2017-12-03 DIAGNOSIS — D225 Melanocytic nevi of trunk: Secondary | ICD-10-CM | POA: Diagnosis not present

## 2017-12-03 DIAGNOSIS — L578 Other skin changes due to chronic exposure to nonionizing radiation: Secondary | ICD-10-CM | POA: Diagnosis not present

## 2017-12-03 DIAGNOSIS — L820 Inflamed seborrheic keratosis: Secondary | ICD-10-CM | POA: Diagnosis not present

## 2017-12-03 DIAGNOSIS — Z1283 Encounter for screening for malignant neoplasm of skin: Secondary | ICD-10-CM | POA: Diagnosis not present

## 2017-12-07 DIAGNOSIS — H353221 Exudative age-related macular degeneration, left eye, with active choroidal neovascularization: Secondary | ICD-10-CM | POA: Diagnosis not present

## 2017-12-07 DIAGNOSIS — H353211 Exudative age-related macular degeneration, right eye, with active choroidal neovascularization: Secondary | ICD-10-CM | POA: Diagnosis not present

## 2017-12-07 DIAGNOSIS — H1851 Endothelial corneal dystrophy: Secondary | ICD-10-CM | POA: Diagnosis not present

## 2018-02-22 DIAGNOSIS — H40003 Preglaucoma, unspecified, bilateral: Secondary | ICD-10-CM | POA: Diagnosis not present

## 2018-03-01 DIAGNOSIS — H401111 Primary open-angle glaucoma, right eye, mild stage: Secondary | ICD-10-CM | POA: Diagnosis not present

## 2018-03-10 DIAGNOSIS — D485 Neoplasm of uncertain behavior of skin: Secondary | ICD-10-CM | POA: Diagnosis not present

## 2018-03-10 DIAGNOSIS — C4442 Squamous cell carcinoma of skin of scalp and neck: Secondary | ICD-10-CM | POA: Diagnosis not present

## 2018-03-10 DIAGNOSIS — Z79899 Other long term (current) drug therapy: Secondary | ICD-10-CM | POA: Diagnosis not present

## 2018-03-10 DIAGNOSIS — Z125 Encounter for screening for malignant neoplasm of prostate: Secondary | ICD-10-CM | POA: Diagnosis not present

## 2018-03-10 DIAGNOSIS — Z85828 Personal history of other malignant neoplasm of skin: Secondary | ICD-10-CM | POA: Diagnosis not present

## 2018-03-10 DIAGNOSIS — I10 Essential (primary) hypertension: Secondary | ICD-10-CM | POA: Diagnosis not present

## 2018-03-10 DIAGNOSIS — C4492 Squamous cell carcinoma of skin, unspecified: Secondary | ICD-10-CM

## 2018-03-10 DIAGNOSIS — L578 Other skin changes due to chronic exposure to nonionizing radiation: Secondary | ICD-10-CM | POA: Diagnosis not present

## 2018-03-10 DIAGNOSIS — L570 Actinic keratosis: Secondary | ICD-10-CM | POA: Diagnosis not present

## 2018-03-10 HISTORY — DX: Squamous cell carcinoma of skin, unspecified: C44.92

## 2018-03-15 DIAGNOSIS — H1851 Endothelial corneal dystrophy: Secondary | ICD-10-CM | POA: Diagnosis not present

## 2018-03-15 DIAGNOSIS — H353221 Exudative age-related macular degeneration, left eye, with active choroidal neovascularization: Secondary | ICD-10-CM | POA: Diagnosis not present

## 2018-03-15 DIAGNOSIS — H353211 Exudative age-related macular degeneration, right eye, with active choroidal neovascularization: Secondary | ICD-10-CM | POA: Diagnosis not present

## 2018-03-17 DIAGNOSIS — D649 Anemia, unspecified: Secondary | ICD-10-CM | POA: Diagnosis not present

## 2018-03-17 DIAGNOSIS — E538 Deficiency of other specified B group vitamins: Secondary | ICD-10-CM | POA: Diagnosis not present

## 2018-03-17 DIAGNOSIS — I10 Essential (primary) hypertension: Secondary | ICD-10-CM | POA: Diagnosis not present

## 2018-03-17 DIAGNOSIS — Z125 Encounter for screening for malignant neoplasm of prostate: Secondary | ICD-10-CM | POA: Diagnosis not present

## 2018-03-17 DIAGNOSIS — Z79899 Other long term (current) drug therapy: Secondary | ICD-10-CM | POA: Diagnosis not present

## 2018-03-17 DIAGNOSIS — J449 Chronic obstructive pulmonary disease, unspecified: Secondary | ICD-10-CM | POA: Diagnosis not present

## 2018-03-17 DIAGNOSIS — E785 Hyperlipidemia, unspecified: Secondary | ICD-10-CM | POA: Diagnosis not present

## 2018-03-23 DIAGNOSIS — J449 Chronic obstructive pulmonary disease, unspecified: Secondary | ICD-10-CM | POA: Diagnosis not present

## 2018-03-23 DIAGNOSIS — E785 Hyperlipidemia, unspecified: Secondary | ICD-10-CM | POA: Diagnosis not present

## 2018-03-23 DIAGNOSIS — I480 Paroxysmal atrial fibrillation: Secondary | ICD-10-CM | POA: Diagnosis not present

## 2018-03-23 DIAGNOSIS — R001 Bradycardia, unspecified: Secondary | ICD-10-CM | POA: Diagnosis not present

## 2018-03-23 DIAGNOSIS — I10 Essential (primary) hypertension: Secondary | ICD-10-CM | POA: Diagnosis not present

## 2018-04-13 DIAGNOSIS — M5441 Lumbago with sciatica, right side: Secondary | ICD-10-CM | POA: Diagnosis not present

## 2018-04-13 DIAGNOSIS — M545 Low back pain: Secondary | ICD-10-CM | POA: Diagnosis not present

## 2018-04-13 DIAGNOSIS — M5442 Lumbago with sciatica, left side: Secondary | ICD-10-CM | POA: Diagnosis not present

## 2018-05-06 DIAGNOSIS — L03019 Cellulitis of unspecified finger: Secondary | ICD-10-CM | POA: Diagnosis not present

## 2018-06-17 DIAGNOSIS — L508 Other urticaria: Secondary | ICD-10-CM | POA: Diagnosis not present

## 2018-06-17 DIAGNOSIS — Z1283 Encounter for screening for malignant neoplasm of skin: Secondary | ICD-10-CM | POA: Diagnosis not present

## 2018-06-17 DIAGNOSIS — L719 Rosacea, unspecified: Secondary | ICD-10-CM | POA: Diagnosis not present

## 2018-06-17 DIAGNOSIS — L570 Actinic keratosis: Secondary | ICD-10-CM | POA: Diagnosis not present

## 2018-06-17 DIAGNOSIS — D2260 Melanocytic nevi of unspecified upper limb, including shoulder: Secondary | ICD-10-CM | POA: Diagnosis not present

## 2018-06-17 DIAGNOSIS — D225 Melanocytic nevi of trunk: Secondary | ICD-10-CM | POA: Diagnosis not present

## 2018-06-17 DIAGNOSIS — L821 Other seborrheic keratosis: Secondary | ICD-10-CM | POA: Diagnosis not present

## 2018-06-17 DIAGNOSIS — L578 Other skin changes due to chronic exposure to nonionizing radiation: Secondary | ICD-10-CM | POA: Diagnosis not present

## 2018-06-17 DIAGNOSIS — Z85828 Personal history of other malignant neoplasm of skin: Secondary | ICD-10-CM | POA: Diagnosis not present

## 2018-06-17 DIAGNOSIS — T07XXXA Unspecified multiple injuries, initial encounter: Secondary | ICD-10-CM | POA: Diagnosis not present

## 2018-06-21 DIAGNOSIS — H353211 Exudative age-related macular degeneration, right eye, with active choroidal neovascularization: Secondary | ICD-10-CM | POA: Diagnosis not present

## 2018-06-21 DIAGNOSIS — H353221 Exudative age-related macular degeneration, left eye, with active choroidal neovascularization: Secondary | ICD-10-CM | POA: Diagnosis not present

## 2018-08-30 DIAGNOSIS — H401111 Primary open-angle glaucoma, right eye, mild stage: Secondary | ICD-10-CM | POA: Diagnosis not present

## 2018-09-27 DIAGNOSIS — H353221 Exudative age-related macular degeneration, left eye, with active choroidal neovascularization: Secondary | ICD-10-CM | POA: Diagnosis not present

## 2018-11-10 DIAGNOSIS — E785 Hyperlipidemia, unspecified: Secondary | ICD-10-CM | POA: Diagnosis not present

## 2018-11-10 DIAGNOSIS — Z79899 Other long term (current) drug therapy: Secondary | ICD-10-CM | POA: Diagnosis not present

## 2018-11-10 DIAGNOSIS — R972 Elevated prostate specific antigen [PSA]: Secondary | ICD-10-CM | POA: Diagnosis not present

## 2018-11-10 DIAGNOSIS — I10 Essential (primary) hypertension: Secondary | ICD-10-CM | POA: Diagnosis not present

## 2018-11-17 DIAGNOSIS — Z79899 Other long term (current) drug therapy: Secondary | ICD-10-CM | POA: Diagnosis not present

## 2018-11-17 DIAGNOSIS — Z0000 Encounter for general adult medical examination without abnormal findings: Secondary | ICD-10-CM | POA: Diagnosis not present

## 2018-11-17 DIAGNOSIS — I480 Paroxysmal atrial fibrillation: Secondary | ICD-10-CM | POA: Diagnosis not present

## 2018-11-17 DIAGNOSIS — N401 Enlarged prostate with lower urinary tract symptoms: Secondary | ICD-10-CM | POA: Diagnosis not present

## 2018-11-17 DIAGNOSIS — R002 Palpitations: Secondary | ICD-10-CM | POA: Diagnosis not present

## 2018-11-17 DIAGNOSIS — J449 Chronic obstructive pulmonary disease, unspecified: Secondary | ICD-10-CM | POA: Diagnosis not present

## 2018-11-17 DIAGNOSIS — E538 Deficiency of other specified B group vitamins: Secondary | ICD-10-CM | POA: Diagnosis not present

## 2018-11-17 DIAGNOSIS — N138 Other obstructive and reflux uropathy: Secondary | ICD-10-CM | POA: Diagnosis not present

## 2018-11-17 DIAGNOSIS — R001 Bradycardia, unspecified: Secondary | ICD-10-CM | POA: Diagnosis not present

## 2018-11-17 DIAGNOSIS — I10 Essential (primary) hypertension: Secondary | ICD-10-CM | POA: Diagnosis not present

## 2018-11-17 DIAGNOSIS — D649 Anemia, unspecified: Secondary | ICD-10-CM | POA: Diagnosis not present

## 2018-11-17 DIAGNOSIS — E785 Hyperlipidemia, unspecified: Secondary | ICD-10-CM | POA: Diagnosis not present

## 2018-12-06 DIAGNOSIS — D649 Anemia, unspecified: Secondary | ICD-10-CM | POA: Diagnosis not present

## 2018-12-13 DIAGNOSIS — H903 Sensorineural hearing loss, bilateral: Secondary | ICD-10-CM | POA: Diagnosis not present

## 2018-12-14 DIAGNOSIS — H903 Sensorineural hearing loss, bilateral: Secondary | ICD-10-CM | POA: Diagnosis not present

## 2018-12-27 DIAGNOSIS — H353211 Exudative age-related macular degeneration, right eye, with active choroidal neovascularization: Secondary | ICD-10-CM | POA: Diagnosis not present

## 2019-02-18 DIAGNOSIS — H401111 Primary open-angle glaucoma, right eye, mild stage: Secondary | ICD-10-CM | POA: Diagnosis not present

## 2019-05-03 DIAGNOSIS — H353211 Exudative age-related macular degeneration, right eye, with active choroidal neovascularization: Secondary | ICD-10-CM | POA: Diagnosis not present

## 2019-05-11 DIAGNOSIS — I10 Essential (primary) hypertension: Secondary | ICD-10-CM | POA: Diagnosis not present

## 2019-05-11 DIAGNOSIS — Z79899 Other long term (current) drug therapy: Secondary | ICD-10-CM | POA: Diagnosis not present

## 2019-05-11 DIAGNOSIS — E785 Hyperlipidemia, unspecified: Secondary | ICD-10-CM | POA: Diagnosis not present

## 2019-05-18 DIAGNOSIS — Z125 Encounter for screening for malignant neoplasm of prostate: Secondary | ICD-10-CM | POA: Diagnosis not present

## 2019-05-18 DIAGNOSIS — E538 Deficiency of other specified B group vitamins: Secondary | ICD-10-CM | POA: Diagnosis not present

## 2019-05-18 DIAGNOSIS — Z0000 Encounter for general adult medical examination without abnormal findings: Secondary | ICD-10-CM | POA: Diagnosis not present

## 2019-05-18 DIAGNOSIS — I480 Paroxysmal atrial fibrillation: Secondary | ICD-10-CM | POA: Diagnosis not present

## 2019-05-18 DIAGNOSIS — J449 Chronic obstructive pulmonary disease, unspecified: Secondary | ICD-10-CM | POA: Diagnosis not present

## 2019-05-18 DIAGNOSIS — E785 Hyperlipidemia, unspecified: Secondary | ICD-10-CM | POA: Diagnosis not present

## 2019-05-18 DIAGNOSIS — I10 Essential (primary) hypertension: Secondary | ICD-10-CM | POA: Diagnosis not present

## 2019-05-18 DIAGNOSIS — Z79899 Other long term (current) drug therapy: Secondary | ICD-10-CM | POA: Diagnosis not present

## 2019-06-14 DIAGNOSIS — I480 Paroxysmal atrial fibrillation: Secondary | ICD-10-CM | POA: Diagnosis not present

## 2019-06-14 DIAGNOSIS — I10 Essential (primary) hypertension: Secondary | ICD-10-CM | POA: Diagnosis not present

## 2019-06-14 DIAGNOSIS — E785 Hyperlipidemia, unspecified: Secondary | ICD-10-CM | POA: Diagnosis not present

## 2019-08-02 DIAGNOSIS — H353211 Exudative age-related macular degeneration, right eye, with active choroidal neovascularization: Secondary | ICD-10-CM | POA: Diagnosis not present

## 2019-08-18 DIAGNOSIS — H401111 Primary open-angle glaucoma, right eye, mild stage: Secondary | ICD-10-CM | POA: Diagnosis not present

## 2019-09-27 DIAGNOSIS — H353211 Exudative age-related macular degeneration, right eye, with active choroidal neovascularization: Secondary | ICD-10-CM | POA: Diagnosis not present

## 2019-10-17 DIAGNOSIS — K4090 Unilateral inguinal hernia, without obstruction or gangrene, not specified as recurrent: Secondary | ICD-10-CM | POA: Diagnosis not present

## 2019-10-17 DIAGNOSIS — R1032 Left lower quadrant pain: Secondary | ICD-10-CM | POA: Diagnosis not present

## 2019-10-19 DIAGNOSIS — K4090 Unilateral inguinal hernia, without obstruction or gangrene, not specified as recurrent: Secondary | ICD-10-CM | POA: Diagnosis not present

## 2019-11-14 DIAGNOSIS — H353211 Exudative age-related macular degeneration, right eye, with active choroidal neovascularization: Secondary | ICD-10-CM | POA: Diagnosis not present

## 2019-11-22 DIAGNOSIS — Z125 Encounter for screening for malignant neoplasm of prostate: Secondary | ICD-10-CM | POA: Diagnosis not present

## 2019-11-22 DIAGNOSIS — E538 Deficiency of other specified B group vitamins: Secondary | ICD-10-CM | POA: Diagnosis not present

## 2019-11-22 DIAGNOSIS — Z79899 Other long term (current) drug therapy: Secondary | ICD-10-CM | POA: Diagnosis not present

## 2019-11-22 DIAGNOSIS — I10 Essential (primary) hypertension: Secondary | ICD-10-CM | POA: Diagnosis not present

## 2019-11-22 DIAGNOSIS — E785 Hyperlipidemia, unspecified: Secondary | ICD-10-CM | POA: Diagnosis not present

## 2019-11-29 DIAGNOSIS — E538 Deficiency of other specified B group vitamins: Secondary | ICD-10-CM | POA: Diagnosis not present

## 2019-11-29 DIAGNOSIS — I10 Essential (primary) hypertension: Secondary | ICD-10-CM | POA: Diagnosis not present

## 2019-11-29 DIAGNOSIS — I480 Paroxysmal atrial fibrillation: Secondary | ICD-10-CM | POA: Diagnosis not present

## 2019-11-29 DIAGNOSIS — R001 Bradycardia, unspecified: Secondary | ICD-10-CM | POA: Diagnosis not present

## 2019-11-29 DIAGNOSIS — E785 Hyperlipidemia, unspecified: Secondary | ICD-10-CM | POA: Diagnosis not present

## 2019-11-29 DIAGNOSIS — Z1211 Encounter for screening for malignant neoplasm of colon: Secondary | ICD-10-CM | POA: Diagnosis not present

## 2019-11-29 DIAGNOSIS — Z0000 Encounter for general adult medical examination without abnormal findings: Secondary | ICD-10-CM | POA: Diagnosis not present

## 2019-11-29 DIAGNOSIS — J449 Chronic obstructive pulmonary disease, unspecified: Secondary | ICD-10-CM | POA: Diagnosis not present

## 2019-11-29 DIAGNOSIS — D649 Anemia, unspecified: Secondary | ICD-10-CM | POA: Diagnosis not present

## 2019-12-02 DIAGNOSIS — C679 Malignant neoplasm of bladder, unspecified: Secondary | ICD-10-CM

## 2019-12-02 HISTORY — DX: Malignant neoplasm of bladder, unspecified: C67.9

## 2019-12-15 DIAGNOSIS — Z1283 Encounter for screening for malignant neoplasm of skin: Secondary | ICD-10-CM | POA: Diagnosis not present

## 2019-12-15 DIAGNOSIS — D229 Melanocytic nevi, unspecified: Secondary | ICD-10-CM | POA: Diagnosis not present

## 2019-12-15 DIAGNOSIS — L570 Actinic keratosis: Secondary | ICD-10-CM | POA: Diagnosis not present

## 2019-12-15 DIAGNOSIS — L821 Other seborrheic keratosis: Secondary | ICD-10-CM | POA: Diagnosis not present

## 2019-12-15 DIAGNOSIS — L578 Other skin changes due to chronic exposure to nonionizing radiation: Secondary | ICD-10-CM | POA: Diagnosis not present

## 2019-12-15 DIAGNOSIS — D692 Other nonthrombocytopenic purpura: Secondary | ICD-10-CM | POA: Diagnosis not present

## 2019-12-15 DIAGNOSIS — Z85828 Personal history of other malignant neoplasm of skin: Secondary | ICD-10-CM | POA: Diagnosis not present

## 2019-12-26 DIAGNOSIS — Z1211 Encounter for screening for malignant neoplasm of colon: Secondary | ICD-10-CM | POA: Diagnosis not present

## 2019-12-26 DIAGNOSIS — H353211 Exudative age-related macular degeneration, right eye, with active choroidal neovascularization: Secondary | ICD-10-CM | POA: Diagnosis not present

## 2020-01-18 DIAGNOSIS — L570 Actinic keratosis: Secondary | ICD-10-CM | POA: Diagnosis not present

## 2020-02-07 DIAGNOSIS — H353211 Exudative age-related macular degeneration, right eye, with active choroidal neovascularization: Secondary | ICD-10-CM | POA: Diagnosis not present

## 2020-02-16 DIAGNOSIS — H401111 Primary open-angle glaucoma, right eye, mild stage: Secondary | ICD-10-CM | POA: Diagnosis not present

## 2020-02-23 DIAGNOSIS — H401111 Primary open-angle glaucoma, right eye, mild stage: Secondary | ICD-10-CM | POA: Diagnosis not present

## 2020-04-03 DIAGNOSIS — H353211 Exudative age-related macular degeneration, right eye, with active choroidal neovascularization: Secondary | ICD-10-CM | POA: Diagnosis not present

## 2020-05-15 DIAGNOSIS — H353211 Exudative age-related macular degeneration, right eye, with active choroidal neovascularization: Secondary | ICD-10-CM | POA: Diagnosis not present

## 2020-06-05 DIAGNOSIS — Z0000 Encounter for general adult medical examination without abnormal findings: Secondary | ICD-10-CM | POA: Diagnosis not present

## 2020-06-05 DIAGNOSIS — J449 Chronic obstructive pulmonary disease, unspecified: Secondary | ICD-10-CM | POA: Diagnosis not present

## 2020-06-05 DIAGNOSIS — Z79899 Other long term (current) drug therapy: Secondary | ICD-10-CM | POA: Diagnosis not present

## 2020-06-05 DIAGNOSIS — I480 Paroxysmal atrial fibrillation: Secondary | ICD-10-CM | POA: Diagnosis not present

## 2020-06-05 DIAGNOSIS — E538 Deficiency of other specified B group vitamins: Secondary | ICD-10-CM | POA: Diagnosis not present

## 2020-06-05 DIAGNOSIS — E785 Hyperlipidemia, unspecified: Secondary | ICD-10-CM | POA: Diagnosis not present

## 2020-06-05 DIAGNOSIS — I10 Essential (primary) hypertension: Secondary | ICD-10-CM | POA: Diagnosis not present

## 2020-06-10 ENCOUNTER — Emergency Department
Admission: EM | Admit: 2020-06-10 | Discharge: 2020-06-11 | Disposition: A | Discharge status: Home / Self Care | Payer: PPO | Attending: Emergency Medicine | Admitting: Emergency Medicine

## 2020-06-10 ENCOUNTER — Other Ambulatory Visit: Payer: Self-pay

## 2020-06-10 DIAGNOSIS — K4090 Unilateral inguinal hernia, without obstruction or gangrene, not specified as recurrent: Secondary | ICD-10-CM | POA: Diagnosis not present

## 2020-06-10 DIAGNOSIS — R109 Unspecified abdominal pain: Secondary | ICD-10-CM | POA: Diagnosis not present

## 2020-06-10 DIAGNOSIS — Z85828 Personal history of other malignant neoplasm of skin: Secondary | ICD-10-CM | POA: Insufficient documentation

## 2020-06-10 DIAGNOSIS — N400 Enlarged prostate without lower urinary tract symptoms: Secondary | ICD-10-CM | POA: Diagnosis not present

## 2020-06-10 DIAGNOSIS — K4091 Unilateral inguinal hernia, without obstruction or gangrene, recurrent: Secondary | ICD-10-CM | POA: Insufficient documentation

## 2020-06-10 DIAGNOSIS — K5732 Diverticulitis of large intestine without perforation or abscess without bleeding: Secondary | ICD-10-CM | POA: Diagnosis not present

## 2020-06-10 DIAGNOSIS — K5792 Diverticulitis of intestine, part unspecified, without perforation or abscess without bleeding: Secondary | ICD-10-CM | POA: Diagnosis not present

## 2020-06-10 DIAGNOSIS — K449 Diaphragmatic hernia without obstruction or gangrene: Secondary | ICD-10-CM | POA: Diagnosis not present

## 2020-06-10 DIAGNOSIS — I700 Atherosclerosis of aorta: Secondary | ICD-10-CM | POA: Diagnosis not present

## 2020-06-10 DIAGNOSIS — Z87891 Personal history of nicotine dependence: Secondary | ICD-10-CM | POA: Insufficient documentation

## 2020-06-10 DIAGNOSIS — R1030 Lower abdominal pain, unspecified: Secondary | ICD-10-CM | POA: Diagnosis present

## 2020-06-10 LAB — CBC
HCT: 38.4 % — ABNORMAL LOW (ref 39.0–52.0)
Hemoglobin: 12.5 g/dL — ABNORMAL LOW (ref 13.0–17.0)
MCH: 27.4 pg (ref 26.0–34.0)
MCHC: 32.6 g/dL (ref 30.0–36.0)
MCV: 84.2 fL (ref 80.0–100.0)
Platelets: 204 10*3/uL (ref 150–400)
RBC: 4.56 MIL/uL (ref 4.22–5.81)
RDW: 14 % (ref 11.5–15.5)
WBC: 11.7 10*3/uL — ABNORMAL HIGH (ref 4.0–10.5)
nRBC: 0 % (ref 0.0–0.2)

## 2020-06-10 LAB — URINALYSIS, COMPLETE (UACMP) WITH MICROSCOPIC
Bacteria, UA: NONE SEEN
Bilirubin Urine: NEGATIVE
Glucose, UA: NEGATIVE mg/dL
Hgb urine dipstick: NEGATIVE
Ketones, ur: NEGATIVE mg/dL
Leukocytes,Ua: NEGATIVE
Nitrite: NEGATIVE
Protein, ur: NEGATIVE mg/dL
Specific Gravity, Urine: 1.017 (ref 1.005–1.030)
pH: 5 (ref 5.0–8.0)

## 2020-06-10 LAB — COMPREHENSIVE METABOLIC PANEL
ALT: 20 U/L (ref 0–44)
AST: 21 U/L (ref 15–41)
Albumin: 3.5 g/dL (ref 3.5–5.0)
Alkaline Phosphatase: 81 U/L (ref 38–126)
Anion gap: 10 (ref 5–15)
BUN: 21 mg/dL (ref 8–23)
CO2: 24 mmol/L (ref 22–32)
Calcium: 8.7 mg/dL — ABNORMAL LOW (ref 8.9–10.3)
Chloride: 99 mmol/L (ref 98–111)
Creatinine, Ser: 1.06 mg/dL (ref 0.61–1.24)
GFR calc Af Amer: >60 mL/min (ref >60)
GFR calc non Af Amer: >60 mL/min (ref >60)
Glucose, Bld: 147 mg/dL — ABNORMAL HIGH (ref 70–99)
Potassium: 3.6 mmol/L (ref 3.5–5.1)
Sodium: 133 mmol/L — ABNORMAL LOW (ref 135–145)
Total Bilirubin: 1.1 mg/dL (ref 0.3–1.2)
Total Protein: 7 g/dL (ref 6.5–8.1)

## 2020-06-10 LAB — LIPASE, BLOOD: Lipase: 20 U/L (ref 11–51)

## 2020-06-10 NOTE — ED Triage Notes (Addendum)
Pt presents to ER c/o of central abd pain and fever. States has a hernia. States felt this pain last weekend but got better. Pain began again last weekend. Has had 2 BM's in last 24 hours. States fever 101 yesterday. Denies urinary symptoms. Took 2 tylenol at 5:30.   A&O, ambulatory.

## 2020-06-10 NOTE — ED Notes (Signed)
Patient to stat desk in no acute distress asking about wait time. Patient given an update on wait time. Patient states that it is cold in here. Patient offered a room blanket but states that he does not want it at this time.

## 2020-06-11 ENCOUNTER — Emergency Department: Payer: PPO

## 2020-06-11 ENCOUNTER — Encounter: Payer: Self-pay | Admitting: Radiology

## 2020-06-11 DIAGNOSIS — K5732 Diverticulitis of large intestine without perforation or abscess without bleeding: Secondary | ICD-10-CM | POA: Diagnosis not present

## 2020-06-11 DIAGNOSIS — K4090 Unilateral inguinal hernia, without obstruction or gangrene, not specified as recurrent: Secondary | ICD-10-CM | POA: Diagnosis not present

## 2020-06-11 DIAGNOSIS — I700 Atherosclerosis of aorta: Secondary | ICD-10-CM | POA: Diagnosis not present

## 2020-06-11 DIAGNOSIS — K449 Diaphragmatic hernia without obstruction or gangrene: Secondary | ICD-10-CM | POA: Diagnosis not present

## 2020-06-11 MED ORDER — SULFAMETHOXAZOLE-TRIMETHOPRIM 800-160 MG PO TABS
1.0000 | ORAL_TABLET | Freq: Two times a day (BID) | ORAL | 0 refills | Status: AC
Start: 2020-06-11 — End: 2020-06-21

## 2020-06-11 MED ORDER — METRONIDAZOLE 500 MG PO TABS
500.0000 mg | ORAL_TABLET | Freq: Once | ORAL | Status: AC
  Administered 2020-06-11: 500 mg via ORAL
  Filled 2020-06-11: qty 1

## 2020-06-11 MED ORDER — METRONIDAZOLE 500 MG PO TABS
500.0000 mg | ORAL_TABLET | Freq: Two times a day (BID) | ORAL | 0 refills | Status: AC
Start: 2020-06-11 — End: 2020-06-21

## 2020-06-11 MED ORDER — SULFAMETHOXAZOLE-TRIMETHOPRIM 800-160 MG PO TABS
1.0000 | ORAL_TABLET | Freq: Once | ORAL | Status: AC
  Administered 2020-06-11: 1 via ORAL
  Filled 2020-06-11: qty 1

## 2020-06-11 MED ORDER — IOHEXOL 300 MG/ML  SOLN
100.0000 mL | Freq: Once | INTRAMUSCULAR | Status: AC | PRN
  Administered 2020-06-11: 100 mL via INTRAVENOUS

## 2020-06-11 NOTE — ED Notes (Addendum)
Pt with wife reports hx of left left inguinal hernia and pain at that site with fever at home of 101  Pt denies pain on palpation left suprapubic area appears without redness and slightly swollen, pt denies pain

## 2020-06-11 NOTE — ED Notes (Signed)
Pt wheeled to car  Peripheral IV discontinued. Catheter intact. No signs of infiltration or redness. Gauze applied to IV site.   Discharge instructions reviewed with patient. Questions fielded by this RN. Patient verbalizes understanding of instructions. Patient discharged home in stable condition per [facility]. No acute distress noted at time of discharge.

## 2020-06-11 NOTE — ED Provider Notes (Signed)
[facility] Emergency Department Provider Note  ____________________________________________   First MD Initiated Contact with Patient 06/11/20 0009     (approximate)  I have reviewed the triage vital signs and the nursing notes.   HISTORY  Chief Complaint Abdominal Pain    HPI Darren Wright is a 86 y.o. male with below list of previous medical conditions including inguinal hernia presents to the emergency department secondary to cute onset of lower abdominal pain that started last weekend but has been persistent over the last day.  Patient also admits to fever at home with a temperature 101.  Patient denies any nausea vomiting or diarrhea.  Patient denies any constipation.  Patient denies any urinary symptoms.        Past Medical History:  Diagnosis Date  . Basal cell carcinoma 12/11/2016   left medial cheek  . Squamous cell carcinoma of skin 03/10/2018   left crown scalp    There are no problems to display for this patient.   Past Surgical History:  Procedure Laterality Date  . CHOLECYSTECTOMY      Prior to Admission medications   Medication Sig Start Date End Date Taking? Authorizing Provider  metroNIDAZOLE (FLAGYL) 500 MG tablet Take 1 tablet (500 mg total) by mouth 2 (two) times daily for 10 days. 06/11/20 06/21/20  Gregor Hams, MD  sulfamethoxazole-trimethoprim (BACTRIM DS) 800-160 MG tablet Take 1 tablet by mouth 2 (two) times daily for 10 days. 06/11/20 06/21/20  Gregor Hams, MD    Allergies Penicillins  History reviewed. No pertinent family history.  Social History Social History   Tobacco Use  . Smoking status: Former Smoker  Substance Use Topics  . Alcohol use: Not Currently  . Drug use: Not on file    Review of Systems Constitutional: No fever/chills Eyes: No visual changes. ENT: No sore throat. Cardiovascular: Denies chest pain. Respiratory: Denies shortness of breath. Gastrointestinal: Positive for  abdominal pain.  No nausea, no vomiting.  No diarrhea.  No constipation. Genitourinary: Negative for dysuria. Musculoskeletal: Negative for neck pain.  Negative for back pain. Integumentary: Negative for rash. Neurological: Negative for headaches, focal weakness or numbness.   ____________________________________________   PHYSICAL EXAM:  VITAL SIGNS: ED Triage Vitals  Enc Vitals Group     BP 06/10/20 1858 (!) 125/51     Pulse Rate 06/10/20 1858 95     Resp 06/10/20 1858 16     Temp 06/10/20 1858 98.6 F (37 C)     Temp Source 06/10/20 1858 Oral     SpO2 06/10/20 1858 98 %     Weight 06/10/20 1900 75.3 kg (166 lb)     Height 06/10/20 1900 1.803 m (5\' 11" )     Head Circumference --      Peak Flow --      Pain Score 06/10/20 1859 0     Pain Loc --      Pain Edu? --      Excl. in [place]? --     Constitutional: Alert and oriented.  Eyes: Conjunctivae are normal.  Head: Atraumatic. Mouth/Throat: Patient is wearing a mask. Neck: No stridor.  No meningeal signs.   Cardiovascular: Normal rate, regular rhythm. Good peripheral circulation. Grossly normal heart sounds. Respiratory: Normal respiratory effort.  No retractions. Gastrointestinal: Bilateral lower quadrant tenderness to palpation.. No distention.   Musculoskeletal: No lower extremity tenderness nor edema. No gross deformities of extremities. Neurologic:  Normal speech and language. No gross focal neurologic deficits are  appreciated.  Skin:  Skin is warm, dry and intact. Psychiatric: Mood and affect are normal. Speech and behavior are normal.  ____________________________________________   LABS (all labs ordered are listed, but only abnormal results are displayed)  Labs Reviewed  COMPREHENSIVE METABOLIC PANEL - Abnormal; Notable for the following components:      Result Value   Sodium 133 (*)    Glucose, Bld 147 (*)    Calcium 8.7 (*)    All other components within normal limits  CBC - Abnormal; Notable for the  following components:   WBC 11.7 (*)    Hemoglobin 12.5 (*)    HCT 38.4 (*)    All other components within normal limits  URINALYSIS, COMPLETE (UACMP) WITH MICROSCOPIC - Abnormal; Notable for the following components:   Color, Urine YELLOW (*)    APPearance HAZY (*)    All other components within normal limits  LIPASE, BLOOD   ____________________________________________  EKG  ED ECG REPORT I, Otway N Thanos [place]au, the attending physician, personally viewed and interpreted this ECG.   Date: 06/10/2020  EKG Time: 7:03 PM  Rate: 86  Rhythm: Normal sinus rhythm  Axis: Normal  Intervals: Normal  ST&T Change: None  ____________________________________________  RADIOLOGY I, Hiltonia N Shayonna Ocampo, personally viewed and evaluated these images (plain radiographs) as part of my medical decision making, as well as reviewing the written report by the radiologist.  ED MD interpretation: Acute diverticulitis of the mid sigmoid colon soft tissue thickening at the right base of the bladder and enlarged prostate.  Official radiology report(s): CT ABDOMEN PELVIS W CONTRAST  Result Date: 06/11/2020 CLINICAL DATA:  86 year old male with abdominal pain and fever. EXAM: CT ABDOMEN AND PELVIS WITH CONTRAST TECHNIQUE: Multidetector CT imaging of the abdomen and pelvis was performed using the standard protocol following bolus administration of intravenous contrast. CONTRAST:  135mL OMNIPAQUE IOHEXOL 300 MG/ML  SOLN COMPARISON:  Abdominal ultrasound 05/08/2014. FINDINGS: Lower chest: Moderate size gastric hiatal hernia. No cardiomegaly, pericardial effusion or pleural effusion. Mild lung base atelectasis. Hepatobiliary: Scattered low-density circumscribed areas in the liver, the largest measuring about 3 cm diameter in the central liver has simple fluid density. All appear benign. Absent gallbladder. Bile ducts within normal limits. Pancreas: Extensive fatty atrophy of the pancreas. Spleen: Negative.  Adrenals/Urinary Tract: Normal adrenal glands. Bilateral renal enhancement and contrast excretion is symmetric and normal. No nephrolithiasis. Occasional tiny renal cysts. There is nonspecific asymmetric soft tissue thickening at the right posterior base of the bladder seen on series 2, image 68. Uniform bladder wall elsewhere. No perivesical stranding. Stomach/Bowel: Negative rectum. Extensive diverticulosis throughout the sigmoid colon, and a portion of the sigmoid mesentery is involved in the left inguinal hernia (coronal image 40). There is mild bowel and mesenteric inflammation in the mid sigmoid (coronal image 50 and series 2, image 58) with mild secondary involvement of adjacent small bowel loops. No extraluminal gas or fluid. Intermittent diverticulosis in the transverse and descending colon with no other active inflammation. Negative right colon and appendix (series 2, image 44). Negative terminal ileum. No dilated small bowel. Negative intraabdominal stomach and duodenum. No free air. No free fluid. Vascular/Lymphatic: Aortoiliac calcified atherosclerosis. Major arterial structures are patent. Portal venous system is patent. No lymphadenopathy. Reproductive: Prostatomegaly, 6.8 cm diameter, with heterogeneous prostate enhancement. Fat containing left inguinal hernia. Other: No pelvic free fluid. Musculoskeletal: No acute osseous abnormality identified. IMPRESSION: 1. Evidence of Acute Diverticulitis of the mid sigmoid colon. Mild secondary involvement of adjacent small bowel but no  abscess or other complicating features. 2. Recommend Urology follow-up for: - indeterminate soft tissue thickening at the right base of the bladder. - enlarged and heterogeneously enhancing prostate. 3. Moderate size gastric hiatal hernia. Fat containing right inguinal hernia, partially containing some of the sigmoid mesentery. Benign appearing hepatic cysts. Aortic Atherosclerosis (ICD10-I70.0). Electronically Signed   By: Genevie Ann M.D.   On: 06/11/2020 01:58    ____________________________________________   PROCEDURES   Procedure(s) performed (including Critical Care):  Procedures   ____________________________________________   INITIAL IMPRESSION / MDM / [place] / ED COURSE  As part of my medical decision making, I reviewed the following data within the electronic MEDICAL RECORD NUMBER   86 year old male presented with above-stated history and physical exam a differential diagnosis including but not limited to diverticulitis, inguinal hernia with incarceration,.  CT scan the abdomen pelvis was performed which revealed evidence of acute mild diverticulitis for which patient was given Bactrim and Flagyl.  Patient will be prescribed the same for home.  Inguinal hernia was also noted containing fat.  Regarding the patient's prostate and bladder findings.  I discussed with the patient and he informed that Dr. Doy Hutching his primary care physician has been following his PSAs.  I encouraged the patient to follow-up with urology and Dr. Doy Hutching for further outpatient evaluation.   ____________________________________________  FINAL CLINICAL IMPRESSION(S) / ED DIAGNOSES  Final diagnoses:  Acute diverticulitis  Right inguinal hernia  Enlarged prostate     MEDICATIONS GIVEN DURING THIS VISIT:  Medications  iohexol (OMNIPAQUE) 300 MG/ML solution 100 mL (100 mLs Intravenous Contrast Given 06/11/20 0127)  sulfamethoxazole-trimethoprim (BACTRIM DS) 800-160 MG per tablet 1 tablet (1 tablet Oral Given 06/11/20 0242)  metroNIDAZOLE (FLAGYL) tablet 500 mg (500 mg Oral Given 06/11/20 0242)     ED Discharge Orders         Ordered    metroNIDAZOLE (FLAGYL) 500 MG tablet  2 times daily     Discontinue  Reprint     06/11/20 0233    sulfamethoxazole-trimethoprim (BACTRIM DS) 800-160 MG tablet  2 times daily     Discontinue  Reprint     06/11/20 0233          *Please note:  Darren Wright was evaluated in  Emergency Department on 06/11/2020 for the symptoms described in the history of present illness. He was evaluated in the context of the global COVID-19 pandemic, which necessitated consideration that the patient might be at risk for infection with the SARS-CoV-2 virus that causes COVID-19. Institutional protocols and algorithms that pertain to the evaluation of patients at risk for COVID-19 are in a state of rapid change based on information released by regulatory bodies including the CDC and federal and state organizations. These policies and algorithms were followed during the patient's care in the ED.  Some ED evaluations and interventions may be delayed as a result of limited staffing during and after the pandemic.*  Note:  This document was prepared using Dragon voice recognition software and may include unintentional dictation errors.   Gregor Hams, MD 06/11/20 [phone]

## 2020-06-11 NOTE — ED Notes (Signed)
Pt to CT

## 2020-06-12 DIAGNOSIS — J449 Chronic obstructive pulmonary disease, unspecified: Secondary | ICD-10-CM | POA: Diagnosis not present

## 2020-06-12 DIAGNOSIS — R001 Bradycardia, unspecified: Secondary | ICD-10-CM | POA: Diagnosis not present

## 2020-06-12 DIAGNOSIS — I10 Essential (primary) hypertension: Secondary | ICD-10-CM | POA: Diagnosis not present

## 2020-06-12 DIAGNOSIS — E785 Hyperlipidemia, unspecified: Secondary | ICD-10-CM | POA: Diagnosis not present

## 2020-06-12 DIAGNOSIS — I480 Paroxysmal atrial fibrillation: Secondary | ICD-10-CM | POA: Diagnosis not present

## 2020-06-12 DIAGNOSIS — K5733 Diverticulitis of large intestine without perforation or abscess with bleeding: Secondary | ICD-10-CM | POA: Diagnosis not present

## 2020-06-12 DIAGNOSIS — N3289 Other specified disorders of bladder: Secondary | ICD-10-CM | POA: Diagnosis not present

## 2020-06-12 DIAGNOSIS — K4020 Bilateral inguinal hernia, without obstruction or gangrene, not specified as recurrent: Secondary | ICD-10-CM | POA: Diagnosis not present

## 2020-06-13 ENCOUNTER — Encounter: Payer: Self-pay | Admitting: Urology

## 2020-06-13 ENCOUNTER — Other Ambulatory Visit: Payer: Self-pay

## 2020-06-13 ENCOUNTER — Ambulatory Visit: Payer: PPO | Admitting: Urology

## 2020-06-13 VITALS — BP 157/77 | HR 78 | Ht 71.0 in | Wt 167.0 lb

## 2020-06-13 DIAGNOSIS — R935 Abnormal findings on diagnostic imaging of other abdominal regions, including retroperitoneum: Secondary | ICD-10-CM

## 2020-06-13 DIAGNOSIS — N401 Enlarged prostate with lower urinary tract symptoms: Secondary | ICD-10-CM

## 2020-06-13 NOTE — Progress Notes (Signed)
06/13/2020 10:41 AM   Darren Wright 11-26-1929 [id number]  Referring provider: Idelle Crouch, MD [facility] [place],  [place] [address] Chief Complaint  Patient presents with  . Other    HPI: Darren Wright is a 86 y.o. male who presents today for evaluation of bladder wall thickening incidentally discovered on CT.  -He was seen in the ED 06/11/20 for lower abdominal pain and diagnosed with diverticulitis -CT performed showed asymmetric thickening of the right bladder wall - Prior hx of BPH on tamsulosin - Mild LUTS including hesitancy and decreased stream but not bothersome -Denies dysuria, gross hematuria,  -Lower abdominal pain has improved on antibiotics   PMH: Past Medical History:  Diagnosis Date  . Basal cell carcinoma 12/11/2016   left medial cheek  . Squamous cell carcinoma of skin 03/10/2018   left crown scalp    Surgical History: Past Surgical History:  Procedure Laterality Date  . CHOLECYSTECTOMY      Home Medications:  Allergies as of 06/13/2020      Reactions   Ibuprofen Hives   Penicillins       Medication List       Accurate as of June 13, 2020 10:41 AM. If you have any questions, ask your nurse or doctor.        benazepril-hydrochlorthiazide 20-12.5 MG tablet Commonly known as: LOTENSIN HCT Take by mouth.   dabigatran 150 MG Caps capsule Commonly known as: PRADAXA Take by mouth.   metoprolol succinate 50 MG 24 hr tablet Commonly known as: TOPROL-XL Take 1 tablet by mouth daily.   metroNIDAZOLE 500 MG tablet Commonly known as: Flagyl Take 1 tablet (500 mg total) by mouth 2 (two) times daily for 10 days.   pantoprazole 40 MG tablet Commonly known as: PROTONIX Take 1 tablet by mouth daily.   sildenafil 20 MG tablet Commonly known as: REVATIO 3-5 tablets daily as needed   sulfamethoxazole-trimethoprim 800-160 MG tablet Commonly known as: BACTRIM DS Take 1 tablet by mouth 2 (two) times daily for  10 days.   tamsulosin 0.4 MG Caps capsule Commonly known as: FLOMAX TAKE 1 CAPSULE EVERY DAY 30 MIN AFTER SAME MEAL EACH DAY   timolol 0.5 % ophthalmic solution Commonly known as: TIMOPTIC Place 1 drop into the right eye daily.       Allergies:  Allergies  Allergen Reactions  . Ibuprofen Hives  . Penicillins     Family History: No family history on file.  Social History:  reports that he has quit smoking. He does not have any smokeless tobacco history on file. He reports previous alcohol use. No history on file for drug use.   Physical Exam: There were no vitals taken for this visit.  Constitutional:  Alert and oriented, No acute distress. HEENT: [place] AT, moist mucus membranes.  Trachea midline, no masses. Cardiovascular: No clubbing, cyanosis, or edema. Respiratory: Normal respiratory effort, no increased work of breathing. GI: Abdomen is soft, nontender, nondistended, no abdominal masses GU: No CVA tenderness. Prostate was 80+ grams, smooth without nodules Lymph: No cervical or inguinal lymphadenopathy. Skin: No rashes, bruises or suspicious lesions. Neurologic: Grossly intact, no focal deficits, moving all 4 extremities. Psychiatric: Normal mood and affect.  Laboratory Data:  Lab Results  Component Value Date   CREATININE 1.06 06/10/2020    Pertinent Imaging: Images were personally reviewed  CLINICAL DATA:  86 year old male with abdominal pain and fever.  EXAM: CT ABDOMEN AND PELVIS WITH CONTRAST  TECHNIQUE:  Multidetector CT imaging of the abdomen and pelvis was performed using the standard protocol following bolus administration of intravenous contrast.  CONTRAST:  140mL OMNIPAQUE IOHEXOL 300 MG/ML  SOLN  COMPARISON:  Abdominal ultrasound 05/08/2014.  FINDINGS: Lower chest: Moderate size gastric hiatal hernia. No cardiomegaly, pericardial effusion or pleural effusion. Mild lung base atelectasis.  Hepatobiliary: Scattered low-density  circumscribed areas in the liver, the largest measuring about 3 cm diameter in the central liver has simple fluid density. All appear benign. Absent gallbladder. Bile ducts within normal limits.  Pancreas: Extensive fatty atrophy of the pancreas.  Spleen: Negative.  Adrenals/Urinary Tract: Normal adrenal glands. Bilateral renal enhancement and contrast excretion is symmetric and normal. No nephrolithiasis. Occasional tiny renal cysts.  There is nonspecific asymmetric soft tissue thickening at the right posterior base of the bladder seen on series 2, image 68. Uniform bladder wall elsewhere. No perivesical stranding.  Stomach/Bowel: Negative rectum. Extensive diverticulosis throughout the sigmoid colon, and a portion of the sigmoid mesentery is involved in the left inguinal hernia (coronal image 40). There is mild bowel and mesenteric inflammation in the mid sigmoid (coronal image 50 and series 2, image 58) with mild secondary involvement of adjacent small bowel loops. No extraluminal gas or fluid.  Intermittent diverticulosis in the transverse and descending colon with no other active inflammation. Negative right colon and appendix (series 2, image 44). Negative terminal ileum. No dilated small bowel. Negative intraabdominal stomach and duodenum. No free air. No free fluid.  Vascular/Lymphatic: Aortoiliac calcified atherosclerosis. Major arterial structures are patent. Portal venous system is patent.  No lymphadenopathy.  Reproductive: Prostatomegaly, 6.8 cm diameter, with heterogeneous prostate enhancement. Fat containing left inguinal hernia.  Other: No pelvic free fluid.  Musculoskeletal: No acute osseous abnormality identified.  IMPRESSION: 1. Evidence of Acute Diverticulitis of the mid sigmoid colon. Mild secondary involvement of adjacent small bowel but no abscess or other complicating features.  2. Recommend Urology follow-up for: - indeterminate  soft tissue thickening at the right base of the bladder. - enlarged and heterogeneously enhancing prostate.  3. Moderate size gastric hiatal hernia. Fat containing right inguinal hernia, partially containing some of the sigmoid mesentery. Benign appearing hepatic cysts. Aortic Atherosclerosis (ICD10-I70.0).   Electronically Signed   By: Genevie Ann M.D.   On: 06/11/2020 01:58  Assessment & Plan:    1. Asymmetric Bladder Wall Thickening on CT -Non-specific finding and most likely not pathologic -Will schedule a cystoscopy for further evaluation  2. BPH with LUTS Stable voiding symptoms on tamsulosin   [facility] [address] [place], [place] [address] [phone]  I, Joneen Boers Peace, am acting as a Education administrator for Dr. Nicki Reaper C. Joby Richart.  I have reviewed the above documentation for accuracy and completeness, and I agree with the above.   Abbie Sons, MD

## 2020-06-13 NOTE — Patient Instructions (Signed)

## 2020-06-14 ENCOUNTER — Ambulatory Visit: Payer: PPO | Admitting: Dermatology

## 2020-06-14 ENCOUNTER — Other Ambulatory Visit: Payer: Self-pay

## 2020-06-14 DIAGNOSIS — L570 Actinic keratosis: Secondary | ICD-10-CM

## 2020-06-14 DIAGNOSIS — L578 Other skin changes due to chronic exposure to nonionizing radiation: Secondary | ICD-10-CM | POA: Diagnosis not present

## 2020-06-14 DIAGNOSIS — L821 Other seborrheic keratosis: Secondary | ICD-10-CM

## 2020-06-14 NOTE — Progress Notes (Signed)
   Follow-Up Visit   Subjective  Darren Wright is a 86 y.o. male who presents for the following: Actinic Keratosis (check for any new or persistent AK's on the face and scalp). S/P PDT on the scalp  The following portions of the chart were reviewed this encounter and updated as appropriate:  Tobacco  Allergies  Meds  Problems  Med Hx  Surg Hx  Fam Hx     Review of Systems:  No other skin or systemic complaints except as noted in HPI or Assessment and Plan.  Objective  Well appearing patient in no apparent distress; mood and affect are within normal limits.  A focused examination was performed including face, arms, hands, and scalp. Relevant physical exam findings are noted in the Assessment and Plan.  Objective  Scalp x 17 (17): Erythematous thin papules/macules with gritty scale.   Assessment & Plan    AK (actinic keratosis) (17) Scalp x 17  Destruction of lesion - Scalp x 17 Complexity: simple   Destruction method: cryotherapy   Informed consent: discussed and consent obtained   Timeout:  patient name, date of birth, surgical site, and procedure verified Lesion destroyed using liquid nitrogen: Yes   Region frozen until ice ball extended beyond lesion: Yes   Outcome: patient tolerated procedure well with no complications   Post-procedure details: wound care instructions given     Actinic Damage - diffuse scaly erythematous macules with underlying dyspigmentation - Recommend daily broad spectrum sunscreen SPF 30+ to sun-exposed areas, reapply every 2 hours as needed.  - Call for new or changing lesions.  Seborrheic Keratoses - Stuck-on, waxy, tan-brown papules and plaques  - Discussed benign etiology and prognosis. - Observe - Call for any changes  Return in about 6 months (around 12/15/2020).  Luther Redo, CMA, am acting as scribe for Sarina Ser, MD .  Documentation: I have reviewed the above documentation for accuracy and completeness, and I agree  with the above.  Sarina Ser, MD

## 2020-06-18 ENCOUNTER — Encounter: Payer: Self-pay | Admitting: Dermatology

## 2020-06-26 DIAGNOSIS — H353211 Exudative age-related macular degeneration, right eye, with active choroidal neovascularization: Secondary | ICD-10-CM | POA: Diagnosis not present

## 2020-07-04 ENCOUNTER — Ambulatory Visit (INDEPENDENT_AMBULATORY_CARE_PROVIDER_SITE_OTHER): Payer: PPO | Admitting: Urology

## 2020-07-04 ENCOUNTER — Encounter: Payer: Self-pay | Admitting: Radiology

## 2020-07-04 ENCOUNTER — Other Ambulatory Visit: Payer: Self-pay | Admitting: Radiology

## 2020-07-04 ENCOUNTER — Other Ambulatory Visit: Payer: Self-pay

## 2020-07-04 ENCOUNTER — Encounter: Payer: Self-pay | Admitting: Urology

## 2020-07-04 VITALS — BP 140/70 | HR 66 | Ht 71.0 in | Wt 174.4 lb

## 2020-07-04 DIAGNOSIS — N401 Enlarged prostate with lower urinary tract symptoms: Secondary | ICD-10-CM | POA: Diagnosis not present

## 2020-07-04 DIAGNOSIS — R935 Abnormal findings on diagnostic imaging of other abdominal regions, including retroperitoneum: Secondary | ICD-10-CM | POA: Diagnosis not present

## 2020-07-04 DIAGNOSIS — D494 Neoplasm of unspecified behavior of bladder: Secondary | ICD-10-CM

## 2020-07-04 MED ORDER — GEMCITABINE CHEMO FOR BLADDER INSTILLATION 2000 MG: 2000.0000 mg | Freq: Once | INTRAVENOUS | Status: DC

## 2020-07-04 NOTE — H&P (View-Only) (Signed)
   07/04/20  CC:  Chief Complaint  Patient presents with  . Cysto    HPI: CT performed ED 06/11/2020 for lower abdominal pain incidentally showed asymmetric thickening of the right bladder wall.  No hematuria or lower urinary tract symptoms  Blood pressure 140/70, pulse 66, height 5\' 11"  (1.803 m), weight 174 lb 6.4 oz (79.1 kg). NED. A&Ox3.   No respiratory distress, lungs clear  Abd soft, NT, ND Normal phallus with bilateral descended testicles  Cystoscopy Procedure Note  Patient identification was confirmed, informed consent was obtained, and patient was prepped using Betadine solution.  Lidocaine jelly was administered per urethral meatus.     Pre-Procedure: Bladder tumor - Inspection reveals a normal caliber urethral meatus.  Procedure: The flexible cystoscope was introduced without difficulty - No urethral strictures/lesions are present. - Marked lateral lobe enlargement with hypervascularity, friable vessels prostate  - Moderate elevation bladder neck - Bilateral ureteral orifices identified - Bladder mucosa a papillary tumor right bladder base measuring ~3 cm - No bladder stones -Trabeculated bladder with scattered diverticula/cellules  Retroflexion shows no intravesical median lobe   Post-Procedure: - Patient tolerated the procedure well   Assessment/ Plan:  1.  Bladder tumor  Findings were discussed in detail with Mr. [facility] and he was informed that the tumor is endoscopically consistent with urothelial carcinoma the bladder.  We discussed the vast majority of these tumors are noninvasive and the endoscopic appearance is not suspicious for muscle invasive disease.  I recommended scheduling TURBT. The indications and nature of the planned procedure were discussed as well as the potential benefits and expected outcome.  Alternatives have been discussed.  Potential complications were discussed including but not limited to bleeding, infection and bladder  perforation. The postoperative care and followup was reviewed.  The patient was informed that he may need additional treatment along with periodic surveillance cystoscopy.  All of his questions were answered and he desires to proceed.  Will plan post resection intravesical gemcitabine   Abbie Sons, MD

## 2020-07-04 NOTE — Progress Notes (Signed)
° °  07/04/20  CC:  Chief Complaint  Patient presents with   Cysto    HPI: CT performed ED 06/11/2020 for lower abdominal pain incidentally showed asymmetric thickening of the right bladder wall.  No hematuria or lower urinary tract symptoms  Blood pressure 140/70, pulse 66, height 5\' 11"  (1.803 m), weight 174 lb 6.4 oz (79.1 kg). NED. A&Ox3.   No respiratory distress, lungs clear  Abd soft, NT, ND Normal phallus with bilateral descended testicles  Cystoscopy Procedure Note  Patient identification was confirmed, informed consent was obtained, and patient was prepped using Betadine solution.  Lidocaine jelly was administered per urethral meatus.     Pre-Procedure: Bladder tumor - Inspection reveals a normal caliber urethral meatus.  Procedure: The flexible cystoscope was introduced without difficulty - No urethral strictures/lesions are present. - Marked lateral lobe enlargement with hypervascularity, friable vessels prostate  - Moderate elevation bladder neck - Bilateral ureteral orifices identified - Bladder mucosa a papillary tumor right bladder base measuring ~3 cm - No bladder stones -Trabeculated bladder with scattered diverticula/cellules  Retroflexion shows no intravesical median lobe   Post-Procedure: - Patient tolerated the procedure well   Assessment/ Plan:  1.  Bladder tumor  Findings were discussed in detail with Mr. [facility] and he was informed that the tumor is endoscopically consistent with urothelial carcinoma the bladder.  We discussed the vast majority of these tumors are noninvasive and the endoscopic appearance is not suspicious for muscle invasive disease.  I recommended scheduling TURBT. The indications and nature of the planned procedure were discussed as well as the potential benefits and expected outcome.  Alternatives have been discussed.  Potential complications were discussed including but not limited to bleeding, infection and bladder  perforation. The postoperative care and followup was reviewed.  The patient was informed that he may need additional treatment along with periodic surveillance cystoscopy.  All of his questions were answered and he desires to proceed.  Will plan post resection intravesical gemcitabine   Abbie Sons, MD

## 2020-07-06 LAB — MICROSCOPIC EXAMINATION
Bacteria, UA: NONE SEEN
RBC, Urine: >30 /hpf — AB (ref 0–2)

## 2020-07-06 LAB — URINALYSIS, COMPLETE
Bilirubin, UA: NEGATIVE
Glucose, UA: NEGATIVE
Ketones, UA: NEGATIVE
Leukocytes,UA: NEGATIVE
Nitrite, UA: NEGATIVE
Protein,UA: NEGATIVE
Specific Gravity, UA: 1.02 (ref 1.005–1.030)
Urobilinogen, Ur: 0.2 mg/dL (ref 0.2–1.0)
pH, UA: 6 (ref 5.0–7.5)

## 2020-07-08 LAB — CULTURE, URINE COMPREHENSIVE

## 2020-07-11 ENCOUNTER — Other Ambulatory Visit: Payer: Self-pay

## 2020-07-11 ENCOUNTER — Encounter
Admission: RE | Admit: 2020-07-11 | Discharge: 2020-07-11 | Disposition: A | Discharge status: Home / Self Care | Payer: PPO | Source: Ambulatory Visit | Attending: Urology | Admitting: Urology

## 2020-07-11 HISTORY — DX: Essential (primary) hypertension: I10

## 2020-07-11 HISTORY — DX: Gastro-esophageal reflux disease without esophagitis: K21.9

## 2020-07-11 HISTORY — DX: Unspecified atrial fibrillation: I48.91

## 2020-07-11 NOTE — Patient Instructions (Addendum)
Your procedure is scheduled on: 07/17/20 Report to [facility] [place]. To find out your arrival time please call [phone] between 1PM - 3PM on 07/16/20.  Remember: Instructions that are not followed completely may result in serious medical risk, up to and including death, or upon the discretion of your surgeon and anesthesiologist your surgery may need to be rescheduled.     _X__ 1. Do not eat food after midnight the night before your procedure.                 No gum chewing or hard candies. You may drink clear liquids up to 2 hours                 before you are scheduled to arrive for your surgery- DO not drink clear                 liquids within 2 hours of the start of your surgery.                 Clear Liquids include:  water, apple juice without pulp, clear carbohydrate                 drink such as Clearfast or Gatorade, Black Coffee or Tea (Do not add                 anything to coffee or tea). Diabetics water only  __X__2.  On the morning of surgery brush your teeth with toothpaste and water, you                 may rinse your mouth with mouthwash if you wish.  Do not swallow any              toothpaste of mouthwash.     _X__ 3.  No Alcohol for 24 hours before or after surgery.   _X__ 4.  Do Not Smoke or use e-cigarettes For 24 Hours Prior to Your Surgery.                 Do not use any chewable tobacco products for at least 6 hours prior to                 surgery.  ____  5.  Bring all medications with you on the day of surgery if instructed.   __X__  6.  Notify your doctor if there is any change in your medical condition      (cold, fever, infections).     Do not wear jewelry, make-up, hairpins, clips or nail polish. Do not wear lotions, powders, or perfumes.  Do not shave 48 hours prior to surgery. Men may shave face and neck. Do not bring valuables to the hospital.    [facility] is not responsible for any belongings or  valuables.  Contacts, dentures/partials or body piercings may not be worn into surgery. Bring a case for your contacts, glasses or hearing aids, a denture cup will be supplied. Leave your suitcase in the car. After surgery it may be brought to your room. For patients admitted to the hospital, discharge time is determined by your treatment team.   Patients discharged the day of surgery will not be allowed to drive home.   Please read over the following fact sheets that you were given:   MRSA Information  __X__ Take these medicines the morning of surgery with A SIP OF WATER:  1. pantoprazole  2.   3.   4.  5.  6.  ____ Fleet Enema (as directed)   ____ Use CHG Soap/SAGE wipes as directed  ____ Use inhalers on the day of surgery  ____ Stop metformin/Janumet/Farxiga 2 days prior to surgery    ____ Take 1/2 of usual insulin dose the night before surgery. No insulin the morning          of surgery.   __X__ Stop Blood Thinners Coumadin/Plavix/Xarelto/Pleta/Pradaxa/Eliquis/Effient/Aspirin  AS PREVIOUSLY INSTRUCTED  __X__ Stop Anti-inflammatories 7 days before surgery such as Advil, Ibuprofen, Motrin,  BC or Goodies Powder, Naprosyn, Naproxen, Aleve, Aspirin    __X__ Stop all herbal supplements, fish oil or vitamin E until after surgery.    ____ Bring C-Pap to the hospital.       Indwelling Urinary Catheter Care, Adult An indwelling urinary catheter is a thin, flexible, germ-free (sterile) tube that is placed into the bladder to help drain urine out of the body. The catheter is inserted into the part of the body that drains urine from the bladder (urethra). Urine drains from the catheter into a drainage bag outside of the body. Taking good care of your catheter will keep it working properly and help to prevent problems from developing. What are the risks?  Bacteria may get into your bladder and cause a urinary tract infection.  Urine flow can become blocked. This can happen  if the catheter is not working correctly, or if you have sediment or a blood clot in your bladder or the catheter.  Tissue near the catheter may become irritated and bleed. How to wear your catheter and your drainage bag Supplies needed  Adhesive tape or a leg strap.  Alcohol wipe or soap and water (if you use tape).  A clean towel (if you use tape).  Overnight drainage bag.  Smaller drainage bag (leg bag). Wearing your catheter and bag Use adhesive tape or a leg strap to attach your catheter to your leg.  Make sure the catheter is not pulled tight.  If a leg strap gets wet, replace it with a dry one.  If you use adhesive tape: 1. Use an alcohol wipe or soap and water to wash off any stickiness on your skin where you had tape before. 2. Use a clean towel to pat-dry the area. 3. Apply the new tape. You should have received a large overnight drainage bag and a smaller leg bag that fits underneath clothing.  You may wear the overnight bag at any time, but you should not wear the leg bag at night.  Always wear the leg bag below your knee.  Make sure the overnight drainage bag is always lower than the level of your bladder, but do not let it touch the floor. Before you go to sleep, hang the bag inside a wastebasket that is covered by a clean plastic bag. How to care for your skin around the catheter     Supplies needed  A clean washcloth.  Water and mild soap.  A clean towel. Caring for your skin and catheter  Every day, use a clean washcloth and soapy water to clean the skin around your catheter. 1. Wash your hands with soap and water. 2. Wet a washcloth in warm water and mild soap. 3. Clean the skin around your urethra.  If you are male:  Use one hand to gently spread the folds of skin around your vagina (labia).  With the washcloth in your other  hand, wipe the inner side of your labia on each side. Do this in a front-to-back direction.  If you are male:  Use  one hand to pull back any skin that covers the end of your penis (foreskin).  With the washcloth in your other hand, wipe your penis in small circles. Start wiping at the tip of your penis, then move outward from the catheter.  Move the foreskin back in place, if this applies. 4. With your free hand, hold the catheter close to where it enters your body. Keep holding the catheter during cleaning so it does not get pulled out. 5. Use your other hand to clean the catheter with the washcloth.  Only wipe downward on the catheter.  Do not wipe upward toward your body, because that may push bacteria into your urethra and cause infection. 6. Use a clean towel to pat-dry the catheter and the skin around it. Make sure to wipe off all soap. 7. Wash your hands with soap and water.  Shower every day. Do not take baths.  Do not use cream, ointment, or lotion on the area where the catheter enters your body, unless your health care provider tells you to do that.  Do not use powders, sprays, or lotions on your genital area.  Check your skin around the catheter every day for signs of infection. Check for: ? Redness, swelling, or pain. ? Fluid or blood. ? Warmth. ? Pus or a bad smell. How to empty the drainage bag Supplies needed  Rubbing alcohol.  Gauze pad or cotton ball.  Adhesive tape or a leg strap. Emptying the bag Empty your drainage bag (your overnight drainage bag or your leg bag) when it is ?- full, or at least 2-3 times a day. Clean the drainage bag according to the manufacturer's instructions or as told by your health care provider. 1. Wash your hands with soap and water. 2. Detach the drainage bag from your leg. 3. Hold the drainage bag over the toilet or a clean container. Make sure the drainage bag is lower than your hips and bladder. This stops urine from going back into the tubing and into your bladder. 4. Open the pour spout at the bottom of the bag. 5. Empty the urine into the  toilet or container. Do not let the pour spout touch any surface. This precaution is important to prevent bacteria from getting in the bag and causing infection. 6. Apply rubbing alcohol to a gauze pad or cotton ball. 7. Use the gauze pad or cotton ball to clean the pour spout. 8. Close the pour spout. 9. Attach the bag to your leg with adhesive tape or a leg strap. 10. Wash your hands with soap and water. How to change the drainage bag Supplies needed:  Alcohol wipes.  A clean drainage bag.  Adhesive tape or a leg strap. Changing the bag Replace your drainage bag with a clean bag if it leaks, starts to smell bad, or looks dirty. 1. Wash your hands with soap and water. 2. Detach the dirty drainage bag from your leg. 3. Pinch the catheter with your fingers so that urine does not spill out. 4. Disconnect the catheter tube from the drainage tube at the connection valve. Do not let the tubes touch any surface. 5. Clean the end of the catheter tube with an alcohol wipe. Use a different alcohol wipe to clean the end of the drainage tube. 6. Connect the catheter tube to the drainage tube of  the clean bag. 7. Attach the clean bag to your leg with adhesive tape or a leg strap. Avoid attaching the new bag too tightly. 8. Wash your hands with soap and water. General instructions   Never pull on your catheter or try to remove it. Pulling can damage your internal tissues.  Always wash your hands before and after you handle your catheter or drainage bag. Use a mild, fragrance-free soap. If soap and water are not available, use hand sanitizer.  Always make sure there are no twists or bends (kinks) in the catheter tube.  Always make sure there are no leaks in the catheter or drainage bag.  Drink enough fluid to keep your urine pale yellow.  Do not take baths, swim, or use a hot tub.  If you are male, wipe from front to back after having a bowel movement. Contact a health care provider  if:  Your urine is cloudy.  Your urine smells unusually bad.  Your catheter gets clogged.  Your catheter starts to leak.  Your bladder feels full. Get help right away if:  You have redness, swelling, or pain where the catheter enters your body.  You have fluid, blood, pus, or a bad smell coming from the area where the catheter enters your body.  The area where the catheter enters your body feels warm to the touch.  You have a fever.  You have pain in your abdomen, legs, lower back, or bladder.  You see blood in the catheter.  Your urine is pink or red.  You have nausea, vomiting, or chills.  Your urine is not draining into the bag.  Your catheter gets pulled out. Summary  An indwelling urinary catheter is a thin, flexible, germ-free (sterile) tube that is placed into the bladder to help drain urine out of the body.  The catheter is inserted into the part of the body that drains urine from the bladder (urethra).  Take good care of your catheter to keep it working properly and help prevent problems from developing.  Always wash your hands before and after you handle your catheter or drainage bag.  Never pull on your catheter or try to remove it. This information is not intended to replace advice given to you by your health care provider. Make sure you discuss any questions you have with your health care provider. Document Revised: 03/11/2019 Document Reviewed: 07/03/2017 Elsevier Patient Education  [place].

## 2020-07-13 ENCOUNTER — Other Ambulatory Visit
Admission: RE | Admit: 2020-07-13 | Discharge: 2020-07-13 | Disposition: A | Discharge status: Home / Self Care | Payer: PPO | Source: Ambulatory Visit | Attending: Urology | Admitting: Urology

## 2020-07-13 ENCOUNTER — Other Ambulatory Visit: Payer: Self-pay

## 2020-07-13 DIAGNOSIS — Z01812 Encounter for preprocedural laboratory examination: Secondary | ICD-10-CM | POA: Insufficient documentation

## 2020-07-13 DIAGNOSIS — Z20822 Contact with and (suspected) exposure to covid-19: Secondary | ICD-10-CM | POA: Insufficient documentation

## 2020-07-14 LAB — SARS CORONAVIRUS 2 (TAT 6-24 HRS): SARS Coronavirus 2: NEGATIVE

## 2020-07-17 ENCOUNTER — Ambulatory Visit
Admission: RE | Admit: 2020-07-17 | Discharge: 2020-07-17 | Disposition: A | Discharge status: Home / Self Care | Payer: PPO | Attending: Urology | Admitting: Urology

## 2020-07-17 ENCOUNTER — Encounter
Admission: RE | Disposition: A | Discharge status: Home / Self Care | Payer: Self-pay | Source: Home / Self Care | Attending: Urology

## 2020-07-17 ENCOUNTER — Ambulatory Visit: Payer: PPO | Admitting: Certified Registered"

## 2020-07-17 ENCOUNTER — Telehealth: Payer: Self-pay | Admitting: Urology

## 2020-07-17 ENCOUNTER — Ambulatory Visit: Payer: PPO

## 2020-07-17 ENCOUNTER — Other Ambulatory Visit: Payer: Self-pay

## 2020-07-17 ENCOUNTER — Encounter: Payer: Self-pay | Admitting: Urology

## 2020-07-17 DIAGNOSIS — I4891 Unspecified atrial fibrillation: Secondary | ICD-10-CM | POA: Diagnosis not present

## 2020-07-17 DIAGNOSIS — Z87891 Personal history of nicotine dependence: Secondary | ICD-10-CM | POA: Insufficient documentation

## 2020-07-17 DIAGNOSIS — D494 Neoplasm of unspecified behavior of bladder: Secondary | ICD-10-CM

## 2020-07-17 DIAGNOSIS — K219 Gastro-esophageal reflux disease without esophagitis: Secondary | ICD-10-CM | POA: Diagnosis not present

## 2020-07-17 DIAGNOSIS — C678 Malignant neoplasm of overlapping sites of bladder: Secondary | ICD-10-CM | POA: Diagnosis not present

## 2020-07-17 DIAGNOSIS — I10 Essential (primary) hypertension: Secondary | ICD-10-CM | POA: Insufficient documentation

## 2020-07-17 DIAGNOSIS — C674 Malignant neoplasm of posterior wall of bladder: Secondary | ICD-10-CM | POA: Diagnosis not present

## 2020-07-17 HISTORY — PX: CYSTOSCOPY W/ RETROGRADES: SHX1426

## 2020-07-17 HISTORY — PX: TRANSURETHRAL RESECTION OF BLADDER TUMOR WITH MITOMYCIN-C: SHX6459

## 2020-07-17 SURGERY — TRANSURETHRAL RESECTION OF BLADDER TUMOR WITH MITOMYCIN-C
Anesthesia: General

## 2020-07-17 MED ORDER — LIDOCAINE HCL (PF) 2 % IJ SOLN
INTRAMUSCULAR | Status: AC
  Filled 2020-07-17: qty 5

## 2020-07-17 MED ORDER — ONDANSETRON HCL 4 MG/2ML IJ SOLN
INTRAMUSCULAR | Status: DC | PRN
  Administered 2020-07-17: 4 mg via INTRAVENOUS

## 2020-07-17 MED ORDER — CHLORHEXIDINE GLUCONATE 0.12 % MT SOLN: 15.0000 mL | Freq: Once | OROMUCOSAL | Status: AC

## 2020-07-17 MED ORDER — LIDOCAINE HCL (CARDIAC) PF 100 MG/5ML IV SOSY
PREFILLED_SYRINGE | INTRAVENOUS | Status: DC | PRN
  Administered 2020-07-17: 80 mg via INTRAVENOUS

## 2020-07-17 MED ORDER — OXYCODONE HCL 5 MG PO TABS: 5.0000 mg | ORAL_TABLET | Freq: Once | ORAL | Status: DC | PRN

## 2020-07-17 MED ORDER — LACTATED RINGERS IV SOLN
INTRAVENOUS | Status: DC
  Administered 2020-07-17: 10 mL/h via INTRAVENOUS

## 2020-07-17 MED ORDER — ORAL CARE MOUTH RINSE: 15.0000 mL | Freq: Once | OROMUCOSAL | Status: AC

## 2020-07-17 MED ORDER — FENTANYL CITRATE (PF) 100 MCG/2ML IJ SOLN
INTRAMUSCULAR | Status: DC | PRN
  Administered 2020-07-17: 25 ug via INTRAVENOUS
  Administered 2020-07-17: 50 ug via INTRAVENOUS
  Administered 2020-07-17: 25 ug via INTRAVENOUS

## 2020-07-17 MED ORDER — FENTANYL CITRATE (PF) 100 MCG/2ML IJ SOLN: 25.0000 ug | INTRAMUSCULAR | Status: DC | PRN

## 2020-07-17 MED ORDER — GEMCITABINE CHEMO FOR BLADDER INSTILLATION 2000 MG
INTRAVENOUS | Status: DC | PRN
  Administered 2020-07-17: 2000 mg via INTRAVESICAL

## 2020-07-17 MED ORDER — PROPOFOL 10 MG/ML IV BOLUS
INTRAVENOUS | Status: AC
  Filled 2020-07-17: qty 20

## 2020-07-17 MED ORDER — PHENYLEPHRINE HCL (PRESSORS) 10 MG/ML IV SOLN
INTRAVENOUS | Status: DC | PRN
  Administered 2020-07-17: 100 ug via INTRAVENOUS

## 2020-07-17 MED ORDER — CHLORHEXIDINE GLUCONATE 0.12 % MT SOLN
OROMUCOSAL | Status: AC
  Administered 2020-07-17: 15 mL via OROMUCOSAL
  Filled 2020-07-17: qty 15

## 2020-07-17 MED ORDER — ONDANSETRON HCL 4 MG/2ML IJ SOLN
INTRAMUSCULAR | Status: AC
  Filled 2020-07-17: qty 2

## 2020-07-17 MED ORDER — DEXAMETHASONE SODIUM PHOSPHATE 10 MG/ML IJ SOLN
INTRAMUSCULAR | Status: AC
  Filled 2020-07-17: qty 1

## 2020-07-17 MED ORDER — PROPOFOL 10 MG/ML IV BOLUS
INTRAVENOUS | Status: DC | PRN
  Administered 2020-07-17 (×2): 110 mg via INTRAVENOUS

## 2020-07-17 MED ORDER — CEFAZOLIN SODIUM-DEXTROSE 2-4 GM/100ML-% IV SOLN
2.0000 g | INTRAVENOUS | Status: AC
  Administered 2020-07-17: 2 g via INTRAVENOUS

## 2020-07-17 MED ORDER — FENTANYL CITRATE (PF) 100 MCG/2ML IJ SOLN
INTRAMUSCULAR | Status: AC
  Filled 2020-07-17: qty 2

## 2020-07-17 MED ORDER — CEFAZOLIN SODIUM-DEXTROSE 2-4 GM/100ML-% IV SOLN
INTRAVENOUS | Status: AC
  Filled 2020-07-17: qty 100

## 2020-07-17 MED ORDER — IOPAMIDOL (ISOVUE-200) INJECTION 41%
INTRAVENOUS | Status: DC | PRN
  Administered 2020-07-17 (×2): 15 mL

## 2020-07-17 MED ORDER — EPHEDRINE SULFATE 50 MG/ML IJ SOLN
INTRAMUSCULAR | Status: DC | PRN
  Administered 2020-07-17: 10 mg via INTRAVENOUS

## 2020-07-17 MED ORDER — DEXAMETHASONE SODIUM PHOSPHATE 10 MG/ML IJ SOLN
INTRAMUSCULAR | Status: DC | PRN
  Administered 2020-07-17: 5 mg via INTRAVENOUS

## 2020-07-17 MED ORDER — OXYCODONE HCL 5 MG/5ML PO SOLN: 5.0000 mg | Freq: Once | ORAL | Status: DC | PRN

## 2020-07-17 MED ORDER — TROSPIUM CHLORIDE 20 MG PO TABS
ORAL_TABLET | ORAL | 0 refills | Status: DC
Start: 2020-07-17 — End: 2020-08-30

## 2020-07-17 SURGICAL SUPPLY — 39 items
BAG DRAIN CYSTO-URO LG1000N (MISCELLANEOUS) ×3 IMPLANT
BAG DRN RND TRDRP ANRFLXCHMBR (UROLOGICAL SUPPLIES) ×2
BAG URINE DRAIN 2000ML AR STRL (UROLOGICAL SUPPLIES) ×3 IMPLANT
BRUSH SCRUB EZ  4% CHG (MISCELLANEOUS) ×1
BRUSH SCRUB EZ 4% CHG (MISCELLANEOUS) ×2 IMPLANT
CATH FOL 2WAY LX 18X30 (CATHETERS) ×3 IMPLANT
CATH FOLEY 2WAY 18X30 (CATHETERS) IMPLANT
CATH FOLEY 2WAY SIL 18X30 (CATHETERS)
CATH URETL 5X70 OPEN END (CATHETERS) ×3 IMPLANT
DRAPE UTILITY 15X26 TOWEL STRL (DRAPES) ×3 IMPLANT
DRSG TELFA 4X3 1S NADH ST (GAUZE/BANDAGES/DRESSINGS) ×3 IMPLANT
ELECT BIVAP BIPO 22/24 DONUT (ELECTROSURGICAL) ×3
ELECT LOOP 22F BIPOLAR SML (ELECTROSURGICAL)
ELECT REM PT RETURN 9FT ADLT (ELECTROSURGICAL)
ELECTRD BIVAP BIPO 22/24 DONUT (ELECTROSURGICAL) ×2 IMPLANT
ELECTRODE LOOP 22F BIPOLAR SML (ELECTROSURGICAL) IMPLANT
ELECTRODE REM PT RTRN 9FT ADLT (ELECTROSURGICAL) IMPLANT
GLIDEWIRE STR 0.035 150CM 3CM (WIRE) ×3 IMPLANT
GLOVE BIOGEL PI IND STRL 7.5 (GLOVE) ×2 IMPLANT
GLOVE BIOGEL PI INDICATOR 7.5 (GLOVE) ×1
GOWN STRL REUS W/ TWL LRG LVL3 (GOWN DISPOSABLE) ×2 IMPLANT
GOWN STRL REUS W/TWL LRG LVL3 (GOWN DISPOSABLE) ×3
GOWN STRL REUS W/TWL XL LVL3 (GOWN DISPOSABLE) ×3 IMPLANT
GOWN STRL REUS W/TWL XL LVL4 (GOWN DISPOSABLE) ×3 IMPLANT
GUIDEWIRE STR DUAL SENSOR (WIRE) ×3 IMPLANT
IV NS IRRIG 3000ML ARTHROMATIC (IV SOLUTION) ×15 IMPLANT
KIT TURNOVER CYSTO (KITS) ×3 IMPLANT
LOOP CUT BIPOLAR 24F LRG (ELECTROSURGICAL) ×3 IMPLANT
PACK CYSTO AR (MISCELLANEOUS) ×3 IMPLANT
SENSORWIRE 0.038 NOT ANGLED (WIRE) ×3
SET CYSTO W/LG BORE CLAMP LF (SET/KITS/TRAYS/PACK) ×3 IMPLANT
SET IRRIG Y TYPE TUR BLADDER L (SET/KITS/TRAYS/PACK) ×3 IMPLANT
SURGILUBE 2OZ TUBE FLIPTOP (MISCELLANEOUS) ×3 IMPLANT
SYR 10ML LL (SYRINGE) ×3 IMPLANT
SYR TOOMEY IRRIG 70ML (MISCELLANEOUS) ×3
SYRINGE IRR TOOMEY STRL 70CC (SYRINGE) ×3 IMPLANT
SYRINGE TOOMEY IRRIG 70ML (MISCELLANEOUS) ×2 IMPLANT
WATER STERILE IRR 1000ML POUR (IV SOLUTION) ×3 IMPLANT
WIRE SENSOR 0.038 NOT ANGLED (WIRE) ×2 IMPLANT

## 2020-07-17 NOTE — Telephone Encounter (Signed)
APP MADE PT IS AWARE ° °Darren Wright °

## 2020-07-17 NOTE — Telephone Encounter (Signed)
-----   Message from Abbie Sons, MD sent at 07/17/2020 10:39 AM EDT ----- Regarding: Follow-up Please schedule to see me Monday 8/23 for pathology review and catheter removal

## 2020-07-17 NOTE — Discharge Instructions (Signed)
Transurethral Resection of Bladder Tumor (TURBT) or Bladder Biopsy   Definition:  Transurethral Resection of the Bladder Tumor is a surgical procedure used to diagnose and remove tumors within the bladder. T  General instructions:     Your recent bladder surgery requires very little post hospital care but some definite precautions.  Despite the fact that no skin incisions were used, the area around the bladder incisions are raw and covered with scabs to promote healing and prevent bleeding. Certain precautions are needed to insure that the scabs are not disturbed over the next 2-4 weeks while the healing proceeds.  Because the raw surface inside your bladder and the irritating effects of urine you may expect frequency of urination and/or urgency (a stronger desire to urinate) and perhaps even getting up at night more often. This will usually resolve or improve slowly over the healing period. You may see some blood in your urine over the first 6 weeks. Do not be alarmed, even if the urine was clear for a while. Get off your feet and drink lots of fluids until clearing occurs. If you start to pass clots or don't improve call us.  Diet:  You may return to your normal diet immediately. Because of the raw surface of your bladder, alcohol, spicy foods, foods high in acid and drinks with caffeine may cause irritation or frequency and should be used in moderation. To keep your urine flowing freely and avoid constipation, drink plenty of fluids during the day (8-10 glasses). Tip: Avoid cranberry juice because it is very acidic.  Activity:  Your physical activity doesn't need to be restricted. However, if you are very active, you may see some blood in the urine. We suggest that you reduce your activity under the circumstances until the bleeding has stopped.  Bowels:  It is important to keep your bowels regular during the postoperative period. Straining with bowel movements can cause bleeding. A bowel  movement every other day is reasonable. Use a mild laxative if needed, such as milk of magnesia 2-3 tablespoons, or 2 Dulcolax tablets. Call if you continue to have problems. If you had been taking narcotics for pain, before, during or after your surgery, you may be constipated. Take a laxative if necessary.    Medication:  You should resume your pre-surgery medications unless told not to. In addition you may be given an antibiotic to prevent or treat infection. Antibiotics are not always necessary. All medication should be taken as prescribed until the bottles are finished unless you are having an unusual reaction to one of the drugs.  A prescription for trospium to take as needed for catheter irritation was sent to your pharmacy   Foley Catheter Care and Patient Education  Perform catheter care every day.  You can do this while in the shower, but NOT while taking a tub bath.  You will need the following supplies: -mild soap, such as Dove -water -a clean washcloth (not one already used for bathing) or a 4x4 piece of gauze -1 Cath-Secure -night drainage bag -2 alcohol swabs  1. Was you hands thoroughly with soap and water 2. Using mild soap and water, clan your genital area a. Men should retract the foreskin, if needed, and clean the area, including the penis b. Women should separate the labia, and clean the area from front to back  3. Clean your urinary opening, which is where the catheter enters your body. 4. Clean the catheter from where it enters your body and then  down, away from your body.  Hold the catheter at the point it enters your body so that you don't put tension on it. 5. Rinse the area well and dry it gently.  Changing the drainage bag You will change your drainage bag twice a day -in the morning after you shower, change he night bag to the leg bag -at night before you go to bed, change the leg bag to the night bag  1. Wash your hands thoroughly with soap and  water 2. Empty the urine from the drainage bag into the toilet before you change it 3. Pinch off the catheter with your fingers and disconnect the used bag 4. Wipe the end of the catheter using an alcohol pad 5. Wipe the connector on the bag using the second alcohol pad 6. Connect the clean bag to the catheter and release your finger pinch 7. Check all connections.  Straighten any kinks or twits in the tubing  Caring for the Leg bag -always wear the leg bag below your knee.  This will help it drain. -keep the leg bag secure with the velcro straps.  If the straps leave a mark on your leg they are to tight and should be loosened.  Leaving the straps too tight can decrease you circulation and lead to blood clots. -empty the leg bag through the spout at the bottom every 2-4 hours as needed.  Do not let the bag become completely full. -do not lie down for longer than 2 hours while you are wearing the leg bag.  Caring for the Night Bag -always keep the night bag below the level of your bladder . -to hang your night bag while you sleep, place a clean plastic bag inside of a wastebasket.  Hang the night bag on the inside of the wastebasket.  Cleaning the Drainage bag 1. Wash you hands thoroughly with soap and water. 2. Rinse the equipment with cool water.  Do not use hot water it can damage the plastic equipment. 3. Was the equipment with a mild liquid detergent (ivory) and rinse with cool water. 4. To decrease odor, fill the bag halfway with a mixture of 1 part vinegar and 3 parts water. Shake the bag and let it sit for 15 minutes. 5. Rinse the bag with cool water and hang it up to dry.  Preventing infection -keep the drainage bag below the level of your bladder and off the floor at all times. -keep the catheter secured to your thigh to prevent it from moving. -do not lie on or block the flow of urine in the tubing. -shower daily to keep the catheter clean. -clean your hands before and after  touching the catheter or bag. -the spout of the drainage bag should never touch the side of the toilet or any emptying container.  Special Points -You may see some blood or urine around where the catheter enters your body, especially when walking or having a bowel movement.  This is normal, as long as there is urine draining into the drainage bag.  If you experience significant leakage around  catheter tube where it enters your urethra possibly associated with lower abdominal cramping you could be having a bladder spasm.  Please notify your doctor and we can prescribe you a medication to improve your symptoms. -drink 1-2 glasses of liquids every 2 hours while you're awake.  Call your doctor immediately if -your catheter comes out, do not try to replace it yourself -you have temperature of  101F (38.8C) or higher -you have decrease in the amount of urine you are making -you have foul-smelling urine -you have bright red blood or large blood clots in your urine -you have abdominal pain and no urine in your catheter bag      [facility] [address] [place], [place] [id number] [phone]    AMBULATORY SURGERY  DISCHARGE INSTRUCTIONS   1) The drugs that you were given will stay in your system until tomorrow so for the next 24 hours you should not:  A) Drive an automobile B) Make any legal decisions C) Drink any alcoholic beverage   2) You may resume regular meals tomorrow.  Today it is better to start with liquids and gradually work up to solid foods.  You may eat anything you prefer, but it is better to start with liquids, then soup and crackers, and gradually work up to solid foods.   3) Please notify your doctor immediately if you have any unusual bleeding, trouble breathing, redness and pain at the surgery site, drainage, fever, or pain not relieved by medication.    4) Additional Instructions:        Please contact your  physician with any problems or Same Day Surgery at [phone], Monday through Friday 6 am to 4 pm, or [place] at [facility] number at [phone].

## 2020-07-17 NOTE — Progress Notes (Signed)
Dr. Bernardo Heater approved for the patient to have irrigation of his catheter due to urine looking like ketchup and clots observed. I will irrigate and monitor this patient.

## 2020-07-17 NOTE — Anesthesia Preprocedure Evaluation (Signed)
Anesthesia Evaluation  Patient identified by MRN, date of birth, ID band Patient awake    Reviewed: Allergy & Precautions, H&P , NPO status , Patient's Chart, lab work & pertinent test results  History of Anesthesia Complications Negative for: history of anesthetic complications  Airway Mallampati: III  TM Distance: >3 FB Neck ROM: limited    Dental  (+) Chipped   Pulmonary neg shortness of breath, former smoker,    Pulmonary exam normal        Cardiovascular Exercise Tolerance: Good hypertension, + dysrhythmias Atrial Fibrillation      Neuro/Psych negative neurological ROS  negative psych ROS   GI/Hepatic Neg liver ROS, GERD  Medicated and Controlled,  Endo/Other  negative endocrine ROS  Renal/GU      Musculoskeletal   Abdominal   Peds  Hematology negative hematology ROS (+)   Anesthesia Other Findings Past Medical History: No date: A-fib ([place]) 12/11/2016: Basal cell carcinoma     Comment:  left medial cheek No date: GERD (gastroesophageal reflux disease) No date: Hypertension 03/10/2018: Squamous cell carcinoma of skin     Comment:  left crown scalp  Past Surgical History: No date: CHOLECYSTECTOMY No date: TONSILLECTOMY  BMI    Body Mass Index: 23.15 kg/m      Reproductive/Obstetrics negative OB ROS                             Anesthesia Physical Anesthesia Plan  ASA: III  Anesthesia Plan: General LMA   Post-op Pain Management:    Induction: Intravenous  PONV Risk Score and Plan: Dexamethasone, Ondansetron, Midazolam and Treatment may vary due to age or medical condition  Airway Management Planned: LMA  Additional Equipment:   Intra-op Plan:   Post-operative Plan: Extubation in OR  Informed Consent: I have reviewed the patients History and Physical, chart, labs and discussed the procedure including the risks, benefits and alternatives for the proposed  anesthesia with the patient or authorized representative who has indicated his/her understanding and acceptance.     Dental Advisory Given  Plan Discussed with: Anesthesiologist, CRNA and Surgeon  Anesthesia Plan Comments: (Patient consented for risks of anesthesia including but not limited to:  - adverse reactions to medications - damage to eyes, teeth, lips or other oral mucosa - nerve damage due to positioning  - sore throat or hoarseness - Damage to heart, brain, nerves, lungs, other parts of body or loss of life  Patient voiced understanding.)        Anesthesia Quick Evaluation

## 2020-07-17 NOTE — Transfer of Care (Signed)
Immediate Anesthesia Transfer of Care Note  Patient: Darren Wright  Procedure(s) Performed: TRANSURETHRAL RESECTION OF BLADDER TUMOR WITH gemcitabine (N/A ) CYSTOSCOPY WITH RETROGRADE PYELOGRAM (Bilateral )  Patient Location: PACU  Anesthesia Type:General  Level of Consciousness: awake and alert   Airway & Oxygen Therapy: Patient Spontanous Breathing and Patient connected to face mask oxygen  Post-op Assessment: Report given to RN and Post -op Vital signs reviewed and stable  Post vital signs: Reviewed and stable  Last Vitals:  Vitals Value Taken Time  BP 107/58 07/17/20 1026  Temp 36.1 C 07/17/20 1026  Pulse 81 07/17/20 1029  Resp 15 07/17/20 1029  SpO2 99 % 07/17/20 1029  Vitals shown include unvalidated device data.  Last Pain:  Vitals:   07/17/20 1026  TempSrc:   PainSc: Asleep         Complications: No complications documented.

## 2020-07-17 NOTE — Anesthesia Postprocedure Evaluation (Signed)
Anesthesia Post Note  Patient: Darren Wright  Procedure(s) Performed: TRANSURETHRAL RESECTION OF BLADDER TUMOR WITH gemcitabine (N/A ) CYSTOSCOPY WITH RETROGRADE PYELOGRAM (Bilateral )  Patient location during evaluation: PACU Anesthesia Type: General Level of consciousness: awake and alert Pain management: pain level controlled Vital Signs Assessment: post-procedure vital signs reviewed and stable Respiratory status: spontaneous breathing, nonlabored ventilation, respiratory function stable and patient connected to nasal cannula oxygen Cardiovascular status: blood pressure returned to baseline and stable Postop Assessment: no apparent nausea or vomiting Anesthetic complications: no   No complications documented.   Last Vitals:  Vitals:   07/17/20 1205 07/17/20 1216  BP: (!) 150/78 (!) 149/82  Pulse: 85 89  Resp: 14 14  Temp: (!) 36.1 C 36.6 C  SpO2: 95% 97%    Last Pain:  Vitals:   07/17/20 1216  TempSrc:   PainSc: 2                  Precious Haws [name]

## 2020-07-17 NOTE — Interval H&P Note (Signed)
History and Physical Interval Note: CV: RRR Lungs: Clear   07/17/2020 8:39 AM  Darren Wright  has presented today for surgery, with the diagnosis of bladder tumor.  The various methods of treatment have been discussed with the patient and family. After consideration of risks, benefits and other options for treatment, the patient has consented to  Procedure(s): TRANSURETHRAL RESECTION OF BLADDER TUMOR WITH gemcitabine (N/A) CYSTOSCOPY WITH RETROGRADE PYELOGRAM (Bilateral) as a surgical intervention.  The patient's history has been reviewed, patient examined, no change in status, stable for surgery.  I have reviewed the patient's chart and labs.  Questions were answered to the patient's satisfaction.     [place]

## 2020-07-17 NOTE — Op Note (Signed)
Preoperative diagnosis: Bladder tumor (~ 3 cm)  Postoperative diagnosis:  Bladder tumor (> 5 cm)  Procedure:  Cystoscopy Transurethral resection of bladder tumor (>5 cm) Bilateral retrograde pyelography with interpretation Instillation intravesical gemcitabine  Surgeon: Nicki Reaper C. Keniyah Gelinas, M.D.  Anesthesia: General  Complications: None  Intraoperative findings:  Bladder tumor: Intraoperative cystoscopy showed the bladder tumor to be much larger than in office estimation.  There was a large, contiguous papillary tumor involving the right posterior wall,  right lateral wall and bladder base extending to the bladder neck.  The posterior-anterior dimension was 6 cm and lateral-medial dimension 5 cm.  The tumor was just lateral to the right ureteral orifice however did encroach medially above and below the orifice. Cystoscopy: Marked lateral lobe enlargement with large median lobe; bladder trabeculation Retrograde pyelography: Bilateral retrograde pyelograms remarkable for normal caliber ureter without filling defect, dilation or stricture.  Collecting systems normal-appearing bilaterally without filling defect or hydronephrosis.  Air bubbles were noted in the right proximal ureter.  EBL: Minimal  Specimens: Bladder tumor Base of tumor   Indication: Darren Wright is a patient incidentally noted to have right bladder wall thickening on a CT performed for abdominal pain.  Cystoscopy remarkable for a papillary bladder tumor.  After reviewing the management options for treatment, he elected to proceed with the above surgical procedure(s). We have discussed the potential benefits and risks of the procedure, side effects of the proposed treatment, the likelihood of the patient achieving the goals of the procedure, and any potential problems that might occur during the procedure or recuperation. Informed consent has been obtained.  Description of procedure:  The patient was taken to the  operating room and general anesthesia was induced.  The patient was placed in the dorsal lithotomy position, prepped and draped in the usual sterile fashion, and preoperative antibiotics were administered. A preoperative time-out was performed.   Cystourethroscopy was performed with findings as described above  The bladder was then systematically examined in its entirety. with findings described above  Attention then turned to the left ureteral orifice and a 5 French ureteral catheter would not advance into the orifice.  A 0.038 Sensor wire was placed through the cystoscope into the ureteral orifice followed by the ureteral catheter.  J hooking of the left distal ureter was noted.  Omnipaque contrast was injected through the ureteral catheter and a retrograde pyelogram was performed with findings as dictated above.  Attention then turned to the right ureteral orifice and a ureteral catheter was used to intubate the ureteral orifice.  Omnipaque contrast was injected through the ureteral catheter and a retrograde pyelogram was performed with findings as dictated above.  A 26 French continuous-flow resectoscope sheath with visual obturator was lubricated and passed per urethra.  The obturator was replaced with an [place] with loop.  The tumor was reinspected and location of the right ureteral orifice marked.    Using bipolar cautery resection, the entire tumor was resected and removed for permanent pathologic analysis.  A second specimen of the tumor base was also sent.  On resection of the lateral tumor aspect the obturator reflex was prominent and relaxation was obtained.  Hemostasis was then achieved with the loop cautery and the bladder was emptied and reinspected with no further bleeding noted at the end of the procedure.  A cystogram was also performed and no extravasation of contrast was noted.  The bladder was then emptied and resectoscope was removed.  An 35 French Foley catheter  was  placed with return of pink-tinged effluent upon irrigation.  The patient appeared to tolerate the procedure well and without complications.  After anesthetic reversal he was transported to the PACU in stable condition.   2000 mg of intravesical gemcitabine was instilled to the bladder.  This was allowed to dwell in the PACU for 1 hour.  This was well-tolerated.  After 1 hour, the catheter was drained and removed.  Plan: Due to the large tumor size will keep his Foley catheter indwelling x5 days  Ronny Korff C. Bernardo Heater,  MD

## 2020-07-17 NOTE — Anesthesia Procedure Notes (Signed)
Procedure Name: LMA Insertion Performed by: Fredderick Phenix, CRNA Pre-anesthesia Checklist: Patient identified, Emergency Drugs available, Suction available and Patient being monitored Patient Re-evaluated:Patient Re-evaluated prior to induction Oxygen Delivery Method: Circle system utilized Preoxygenation: Pre-oxygenation with 100% oxygen Induction Type: IV induction Ventilation: Mask ventilation without difficulty LMA: LMA inserted LMA Size: 4.0 Tube type: Oral Number of attempts: 1 Airway Equipment and Method: Oral airway Placement Confirmation: ETT inserted through vocal cords under direct vision,  positive ETCO2 and breath sounds checked- equal and bilateral Tube secured with: Tape Dental Injury: Teeth and Oropharynx as per pre-operative assessment

## 2020-07-18 ENCOUNTER — Encounter: Payer: Self-pay | Admitting: Urology

## 2020-07-18 LAB — SURGICAL PATHOLOGY

## 2020-07-23 ENCOUNTER — Other Ambulatory Visit: Payer: Self-pay

## 2020-07-23 ENCOUNTER — Ambulatory Visit: Payer: PPO | Admitting: Urology

## 2020-07-23 VITALS — BP 136/83 | HR 88 | Ht 71.0 in | Wt 166.0 lb

## 2020-07-23 DIAGNOSIS — C679 Malignant neoplasm of bladder, unspecified: Secondary | ICD-10-CM

## 2020-07-23 NOTE — Progress Notes (Signed)
Catheter Removal  Patient is present today for a catheter removal.  21ml of water was drained from the balloon. A 18FR foley cath was removed from the bladder no complications were noted . Patient tolerated well.  Performed by: Bradly Bienenstock, CMA

## 2020-07-23 NOTE — Progress Notes (Signed)
07/23/2020 9:19 AM   Darren Wright [birth year] [id number]  Referring provider: Idelle Crouch, MD [facility] [address] [facility] [place],  [place] [address]  Chief Complaint  Patient presents with  . Results    HPI: 86 y.o. male presents for postop follow-up and catheter removal/pathology review   TURBT 07/17/2020 for >5 cm papillary tumor  Started Pradaxa back the day after surgery and has had intermittent hematuria  Pathology low-grade, noninvasive urothelial carcinoma  PMH: Past Medical History:  Diagnosis Date  . A-fib ([place])   . Basal cell carcinoma 12/11/2016   left medial cheek  . GERD (gastroesophageal reflux disease)   . Hypertension   . Squamous cell carcinoma of skin 03/10/2018   left crown scalp    Surgical History: Past Surgical History:  Procedure Laterality Date  . CHOLECYSTECTOMY    . CYSTOSCOPY W/ RETROGRADES Bilateral 07/17/2020   Procedure: CYSTOSCOPY WITH RETROGRADE PYELOGRAM;  Surgeon: Abbie Sons, MD;  Location: [facility] ORS;  Service: Urology;  Laterality: Bilateral;  . TONSILLECTOMY    . TRANSURETHRAL RESECTION OF BLADDER TUMOR WITH MITOMYCIN-C N/A 07/17/2020   Procedure: TRANSURETHRAL RESECTION OF BLADDER TUMOR WITH gemcitabine;  Surgeon: Abbie Sons, MD;  Location: [facility] ORS;  Service: Urology;  Laterality: N/A;    Home Medications:  Allergies as of 07/23/2020      Reactions   Ibuprofen Hives   Penicillins    Swelling 1 week after having injection. Unsure if it was really the cause.      Medication List       Accurate as of July 23, 2020  9:19 AM. If you have any questions, ask your nurse or doctor.        benazepril-hydrochlorthiazide 20-12.5 MG tablet Commonly known as: LOTENSIN HCT Take 1 tablet by mouth daily.   cyanocobalamin 1000 MCG tablet Take 1,000 mcg by mouth daily.   dabigatran 150 MG Caps capsule Commonly known as: PRADAXA Take 150 mg by mouth 2 (two) times daily.   pantoprazole 40 MG  tablet Commonly known as: PROTONIX Take 40 mg by mouth daily.   PRESERVISION AREDS 2 PO Take 1 capsule by mouth in the morning and at bedtime.   sildenafil 20 MG tablet Commonly known as: REVATIO 3-5 tablets daily as needed   tamsulosin 0.4 MG Caps capsule Commonly known as: FLOMAX Take 0.4 mg by mouth at bedtime.   timolol 0.5 % ophthalmic solution Commonly known as: TIMOPTIC Place 1 drop into the right eye daily.   trospium 20 MG tablet Commonly known as: SANCTURA 1 tab twice daily as needed for catheter irritation       Allergies:  Allergies  Allergen Reactions  . Ibuprofen Hives  . Penicillins     Swelling 1 week after having injection. Unsure if it was really the cause.    Family History: No family history on file.  Social History:  reports that he quit smoking about 36 years ago. His smoking use included cigarettes. He has never used smokeless tobacco. He reports previous alcohol use. He reports that he does not use drugs.   Physical Exam: BP 136/83 (BP Location: Left Arm, Patient Position: Sitting, Cuff Size: Normal)   Pulse 88   Ht 5\' 11"  (1.803 m)   Wt 166 lb (75.3 kg)   BMI 23.15 kg/m   Constitutional:  Alert and oriented, No acute distress. HEENT: [place] AT, moist mucus membranes.  Trachea midline, no masses. Cardiovascular: No clubbing, cyanosis, or edema. Respiratory: Normal  respiratory effort, no increased work of breathing. GU: Translucent, blood-tinged urine and catheter bag Skin: No rashes, bruises or suspicious lesions. Neurologic: Grossly intact, no focal deficits, moving all 4 extremities. Psychiatric: Normal mood and affect.    Assessment & Plan:    1.  Urothelial carcinoma bladder (Ta, low-grade)  Pathology report discussed in detail, based on size considered intermediate risk  He received post resection gemcitabine  Discussed a 6-week course of intravesical gemcitabine and he would like to pursue  Oncology referral to start a  6-week course gemcitabine 2000 mg in approximately 1 month  Surveillance cystoscopy 3 months after completing [place], MD  [place] [address], [place], [place] [address] [phone]

## 2020-07-27 ENCOUNTER — Encounter: Payer: Self-pay | Admitting: Urology

## 2020-07-30 ENCOUNTER — Inpatient Hospital Stay: Payer: PPO | Attending: Oncology | Admitting: Oncology

## 2020-07-30 ENCOUNTER — Inpatient Hospital Stay: Payer: PPO

## 2020-07-30 ENCOUNTER — Encounter: Payer: Self-pay | Admitting: Oncology

## 2020-07-30 ENCOUNTER — Other Ambulatory Visit: Payer: Self-pay

## 2020-07-30 VITALS — BP 169/80 | HR 66 | Temp 97.0°F | Resp 18 | Ht 70.08 in | Wt 161.8 lb

## 2020-07-30 DIAGNOSIS — Z85828 Personal history of other malignant neoplasm of skin: Secondary | ICD-10-CM | POA: Insufficient documentation

## 2020-07-30 DIAGNOSIS — Z886 Allergy status to analgesic agent status: Secondary | ICD-10-CM | POA: Diagnosis not present

## 2020-07-30 DIAGNOSIS — C679 Malignant neoplasm of bladder, unspecified: Secondary | ICD-10-CM | POA: Insufficient documentation

## 2020-07-30 DIAGNOSIS — H353211 Exudative age-related macular degeneration, right eye, with active choroidal neovascularization: Secondary | ICD-10-CM | POA: Diagnosis not present

## 2020-07-30 DIAGNOSIS — R319 Hematuria, unspecified: Secondary | ICD-10-CM | POA: Diagnosis not present

## 2020-07-30 DIAGNOSIS — I10 Essential (primary) hypertension: Secondary | ICD-10-CM | POA: Insufficient documentation

## 2020-07-30 DIAGNOSIS — Z7901 Long term (current) use of anticoagulants: Secondary | ICD-10-CM

## 2020-07-30 DIAGNOSIS — Z8052 Family history of malignant neoplasm of bladder: Secondary | ICD-10-CM | POA: Diagnosis not present

## 2020-07-30 DIAGNOSIS — I4891 Unspecified atrial fibrillation: Secondary | ICD-10-CM | POA: Diagnosis not present

## 2020-07-30 DIAGNOSIS — Z79899 Other long term (current) drug therapy: Secondary | ICD-10-CM | POA: Diagnosis not present

## 2020-07-30 DIAGNOSIS — Z880 Allergy status to penicillin: Secondary | ICD-10-CM | POA: Insufficient documentation

## 2020-07-30 DIAGNOSIS — R1030 Lower abdominal pain, unspecified: Secondary | ICD-10-CM

## 2020-07-30 DIAGNOSIS — Z8042 Family history of malignant neoplasm of prostate: Secondary | ICD-10-CM

## 2020-07-30 DIAGNOSIS — K219 Gastro-esophageal reflux disease without esophagitis: Secondary | ICD-10-CM | POA: Insufficient documentation

## 2020-07-30 DIAGNOSIS — Z800 Family history of malignant neoplasm of digestive organs: Secondary | ICD-10-CM

## 2020-07-30 DIAGNOSIS — Z9049 Acquired absence of other specified parts of digestive tract: Secondary | ICD-10-CM

## 2020-07-30 DIAGNOSIS — Z7189 Other specified counseling: Secondary | ICD-10-CM | POA: Insufficient documentation

## 2020-07-30 DIAGNOSIS — Z87891 Personal history of nicotine dependence: Secondary | ICD-10-CM | POA: Insufficient documentation

## 2020-07-30 DIAGNOSIS — Z803 Family history of malignant neoplasm of breast: Secondary | ICD-10-CM | POA: Insufficient documentation

## 2020-07-30 NOTE — Progress Notes (Signed)
Hematology/Oncology Consult note [facility] [phone] [phone]   Patient Care Team: Idelle Crouch, MD as PCP - General (Internal Medicine)  REFERRING PROVIDER: Abbie Sons, MD  CHIEF COMPLAINTS/REASON FOR VISIT:  Evaluation of intravesical chemotherapy  HISTORY OF PRESENTING ILLNESS:   Darren Wright is a  86 y.o.  male with PMH including squamous cell carcinoma of the skin, hypertension, GERD, basal cell carcinoma, A. fib on anticoagulation, newly diagnosed nonmuscle invasive bladder cancer was seen in consultation at the request of  Abbie Sons, MD  for evaluation of intravesical chemotherapy. Patient had CT scan done in January for evaluation of low abdominal pain and was diagnosed with diverticulitis.  Symptoms improved after antibiotics course. There was incidental finding of indeterminate soft tissue thickening at the right base of bladder.  As well as enlarged and heterogeneously enhancing prostate.  Patient denies any hematuria, difficulty urination. Patient was referred to see Dr. Bernardo Heater and patient had cystoscopy on 07/17/2020.  Intraoperative findings showed bladder tumor, involving the right posterior wall, right lateral wall and the bladder base extending to the bladder neck.  The posterior anterior dimension was 6 cm in the lateral medial dimension 5 cm.  Patient underwent TURBT for resection of the tumor. Pathology showed noninvasive papillary urothelial carcinoma, low-grade, muscularis propria present and uninvolved.   Given the large size of bladder tumor, Dr. Bernardo Heater recommend intravesical chemo Patient was referred to establish care with oncology for intravesical chemotherapy.  Patient reports feeling well today.  He has some hematuria after the surgery.  Pradaxa was resumed after surgery.  Today he reports that his urine color was normal this morning.  Review of Systems  Constitutional: Negative for appetite  change, chills, fatigue, fever and unexpected weight change.  HENT:   Negative for hearing loss and voice change.   Eyes: Negative for eye problems and icterus.  Respiratory: Negative for chest tightness, cough and shortness of breath.   Cardiovascular: Negative for chest pain and leg swelling.  Gastrointestinal: Negative for abdominal distention and abdominal pain.  Endocrine: Negative for hot flashes.  Genitourinary: Negative for difficulty urinating, dysuria and frequency.   Musculoskeletal: Negative for arthralgias.  Skin: Negative for itching and rash.  Neurological: Negative for light-headedness and numbness.  Hematological: Negative for adenopathy. Does not bruise/bleed easily.  Psychiatric/Behavioral: Negative for confusion.    MEDICAL HISTORY:  Past Medical History:  Diagnosis Date  . A-fib ([place])   . Basal cell carcinoma 12/11/2016   left medial cheek  . GERD (gastroesophageal reflux disease)   . Hypertension   . Squamous cell carcinoma of skin 03/10/2018   left crown scalp    SURGICAL HISTORY: Past Surgical History:  Procedure Laterality Date  . CHOLECYSTECTOMY    . CYSTOSCOPY W/ RETROGRADES Bilateral 07/17/2020   Procedure: CYSTOSCOPY WITH RETROGRADE PYELOGRAM;  Surgeon: Abbie Sons, MD;  Location: [facility] ORS;  Service: Urology;  Laterality: Bilateral;  . TONSILLECTOMY    . TRANSURETHRAL RESECTION OF BLADDER TUMOR WITH MITOMYCIN-C N/A 07/17/2020   Procedure: TRANSURETHRAL RESECTION OF BLADDER TUMOR WITH gemcitabine;  Surgeon: Abbie Sons, MD;  Location: [facility] ORS;  Service: Urology;  Laterality: N/A;    SOCIAL HISTORY: Social History   Socioeconomic History  . Marital status: Married    Spouse name: Not on file  . Number of children: Not on file  . Years of education: Not on file  . Highest education level: Not on file  Occupational History  . Not on  file  Tobacco Use  . Smoking status: Former Smoker    Types: Cigarettes    Quit date: 1985    Years  since quitting: 36.6  . Smokeless tobacco: Never Used  Substance and Sexual Activity  . Alcohol use: Not Currently  . Drug use: Never  . Sexual activity: Not on file  Other Topics Concern  . Not on file  Social History Narrative  . Not on file   Social Determinants of Health   Financial Resource Strain:   . Difficulty of Paying Living Expenses: Not on file  Food Insecurity:   . Worried About Charity fundraiser in the Last Year: Not on file  . Ran Out of Food in the Last Year: Not on file  Transportation Needs:   . Lack of Transportation (Medical): Not on file  . Lack of Transportation (Non-Medical): Not on file  Physical Activity:   . Days of Exercise per Week: Not on file  . Minutes of Exercise per Session: Not on file  Stress:   . Feeling of Stress : Not on file  Social Connections:   . Frequency of Communication with Friends and Family: Not on file  . Frequency of Social Gatherings with Friends and Family: Not on file  . Attends Religious Services: Not on file  . Active Member of Clubs or Organizations: Not on file  . Attends Archivist Meetings: Not on file  . Marital Status: Not on file  Intimate Partner Violence:   . Fear of Current or Ex-Partner: Not on file  . Emotionally Abused: Not on file  . Physically Abused: Not on file  . Sexually Abused: Not on file    FAMILY HISTORY: Family History  Problem Relation Age of Onset  . Pancreatic cancer Mother   . Prostate cancer Father   . Breast cancer Sister   . Bladder Cancer Sister     ALLERGIES:  is allergic to ibuprofen and penicillins.  MEDICATIONS:  Current Outpatient Medications  Medication Sig Dispense Refill  . benazepril-hydrochlorthiazide (LOTENSIN HCT) 20-12.5 MG tablet Take 1 tablet by mouth daily.     . cyanocobalamin 1000 MCG tablet Take 1,000 mcg by mouth daily.     . dabigatran (PRADAXA) 150 MG CAPS capsule Take 150 mg by mouth 2 (two) times daily.     . Multiple Vitamins-Minerals  (PRESERVISION AREDS 2 PO) Take 1 capsule by mouth in the morning and at bedtime.    . pantoprazole (PROTONIX) 40 MG tablet Take 40 mg by mouth daily.     . tamsulosin (FLOMAX) 0.4 MG CAPS capsule Take 0.4 mg by mouth at bedtime.     . timolol (TIMOPTIC) 0.5 % ophthalmic solution Place 1 drop into the right eye daily.    . trospium (SANCTURA) 20 MG tablet 1 tab twice daily as needed for catheter irritation 14 tablet 0  . sildenafil (REVATIO) 20 MG tablet 3-5 tablets daily as needed (Patient not taking: Reported on 07/30/2020)     No current facility-administered medications for this visit.     PHYSICAL EXAMINATION: ECOG PERFORMANCE STATUS: 1 - Symptomatic but completely ambulatory Vitals:   07/30/20 1041  BP: (!) 169/80  Pulse: 66  Resp: 18  Temp: (!) 97 F (36.1 C)   Filed Weights   07/30/20 1041  Weight: 161 lb 12.8 oz (73.4 kg)    Physical Exam Constitutional:      General: He is not in acute distress. HENT:     Head: Normocephalic  and atraumatic.  Eyes:     General: No scleral icterus. Cardiovascular:     Rate and Rhythm: Normal rate and regular rhythm.     Heart sounds: Normal heart sounds.  Pulmonary:     Effort: Pulmonary effort is normal. No respiratory distress.     Breath sounds: No wheezing.  Abdominal:     General: Bowel sounds are normal. There is no distension.     Palpations: Abdomen is soft.  Musculoskeletal:        General: No deformity. Normal range of motion.     Cervical back: Normal range of motion and neck supple.  Skin:    General: Skin is warm and dry.     Findings: No erythema or rash.  Neurological:     Mental Status: He is alert and oriented to person, place, and time. Mental status is at baseline.     Cranial Nerves: No cranial nerve deficit.     Coordination: Coordination normal.  Psychiatric:        Mood and Affect: Mood normal.     LABORATORY DATA:  I have reviewed the data as listed Lab Results  Component Value Date   WBC 11.7  (H) 06/10/2020   HGB 12.5 (L) 06/10/2020   HCT 38.4 (L) 06/10/2020   MCV 84.2 06/10/2020   PLT 204 06/10/2020   Recent Labs    06/10/20 1902  NA 133*  K 3.6  CL 99  CO2 24  GLUCOSE 147*  BUN 21  CREATININE 1.06  CALCIUM 8.7*  GFRNONAA >60  GFRAA >60  PROT 7.0  ALBUMIN 3.5  AST 21  ALT 20  ALKPHOS 81  BILITOT 1.1   Iron/TIBC/Ferritin/ %Sat No results found for: IRON, TIBC, FERRITIN, IRONPCTSAT    RADIOGRAPHIC STUDIES: I have personally reviewed the radiological images as listed and agreed with the findings in the report. DG OR UROLOGY CYSTO IMAGE ([facility] ONLY)  Result Date: 07/17/2020 There is no interpretation for this exam.  This order is for images obtained during a surgical procedure.  Please See "Surgeries" Tab for more information regarding the procedure.      ASSESSMENT & PLAN:  1. Urothelial carcinoma of bladder ([place])   2. Goals of care, counseling/discussion    Pathology was reviewed and discussed with patient. I agree with Dr. Bernardo Heater that given the patient's tumor size is more than 5 cm, patient belongs to intermediate risk group Intravesical therapy is suggested following TURBT.  Recommend 6 weekly treatment of gemcitabine started about 1 month after surgery. The diagnosis and care plan were discussed with patient in detail.  NCCN guidelines were reviewed and shared with patient.   Goals of treatment was discussed with the patient.  Curative intent. I explained to the patient the risks and benefits of intravesical chemotherapy including all but not limited to decrease immune system, blood level, platelet count, bladder irritation, perforation risk of life threatening infection.  Patient voices understanding and willing to proceed intravesical chemotherapy.  Plan starting treatment around 08/20/2020.  # Chemotherapy education; Supportive care measures are necessary for patient well-being and will be provided as necessary. We spent sufficient time to  discuss many aspect of care, questions were answered to patient's satisfaction.   Orders Placed This Encounter  Procedures  . CBC with Differential/Platelet    Standing Status:   Future    Standing Expiration Date:   07/30/2021  . Comprehensive metabolic panel    Standing Status:   Future    Standing Expiration  Date:   07/30/2021  . Urinalysis, Complete w Microscopic    Standing Status:   Future    Standing Expiration Date:   07/30/2021    All questions were answered. The patient knows to call the clinic with any problems questions or concerns.  cc Stoioff, Ronda Fairly, MD    Return of visit: 3 weeks Thank you for this kind referral and the opportunity to participate in the care of this patient. A copy of today's note is routed to referring provider    Earlie Server, MD, PhD Hematology Oncology [facility] Pager- [phone] 07/30/2020

## 2020-07-30 NOTE — Progress Notes (Signed)
START OFF PATHWAY REGIMEN - Bladder   [id number]:Gemcitabine Intravesical 2,000 mg D1 q7 Days x 6 Cycles:   A cycle is every 7 days:     Gemcitabine   **Always confirm dose/schedule in your pharmacy ordering system**  Patient Characteristics: Pre-Cystectomy or Nonsurgical Candidate (Clinical Staging), Low-Grade cTa, cN0 Therapeutic Status: Pre-Cystectomy or Nonsurgical Candidate (Clinical Staging) AJCC M Category: cM0 AJCC 8 Stage Grouping: 0a AJCC T Category: cTa AJCC N Category: cN0 Intent of Therapy: Curative Intent, Discussed with Patient

## 2020-07-30 NOTE — Progress Notes (Signed)
New patient evaluaiton.

## 2020-07-31 ENCOUNTER — Ambulatory Visit: Payer: PPO | Attending: Internal Medicine

## 2020-07-31 DIAGNOSIS — Z23 Encounter for immunization: Secondary | ICD-10-CM

## 2020-07-31 NOTE — Progress Notes (Signed)
° °  Covid-19 Vaccination Clinic  Name:  Darren Wright    MRN: [id number] DOB: 10-Sep-1929  07/31/2020  [name] refused to wait in building. Pt sat in vehicle before leaving site spoke with daughter a [facility]. Pharmacist Jeannene Patella) pt did not report any issues or concerns.  He was provided with Vaccine Information Sheet and instruction to access the V-Safe system.   [name] was instructed to call 911 with any severe reactions post vaccine:  Difficulty breathing   Swelling of face and throat   A fast heartbeat   A bad rash all over body   Dizziness and weakness

## 2020-08-01 NOTE — Patient Instructions (Signed)
Gemcitabine injection What is this medicine? GEMCITABINE (jem SYE ta been) is a chemotherapy drug. This medicine is used to treat many types of cancer like breast cancer, lung cancer, pancreatic cancer, and ovarian cancer. This medicine may be used for other purposes; ask your health care provider or pharmacist if you have questions. COMMON BRAND NAME(S): Gemzar, Infugem What should I tell my health care provider before I take this medicine? They need to know if you have any of these conditions:  blood disorders  infection  kidney disease  liver disease  lung or breathing disease, like asthma  recent or ongoing radiation therapy  an unusual or allergic reaction to gemcitabine, other chemotherapy, other medicines, foods, dyes, or preservatives  pregnant or trying to get pregnant  breast-feeding How should I use this medicine? This drug is given as an infusion into a vein. It is administered in a hospital or clinic by a specially trained health care professional. Talk to your pediatrician regarding the use of this medicine in children. Special care may be needed. Overdosage: If you think you have taken too much of this medicine contact a poison control center or emergency room at once. NOTE: This medicine is only for you. Do not share this medicine with others. What if I miss a dose? It is important not to miss your dose. Call your doctor or health care professional if you are unable to keep an appointment. What may interact with this medicine?  medicines to increase blood counts like filgrastim, pegfilgrastim, sargramostim  some other chemotherapy drugs like cisplatin  vaccines Talk to your doctor or health care professional before taking any of these medicines:  acetaminophen  aspirin  ibuprofen  ketoprofen  naproxen This list may not describe all possible interactions. Give your health care provider a list of all the medicines, herbs, non-prescription drugs, or  dietary supplements you use. Also tell them if you smoke, drink alcohol, or use illegal drugs. Some items may interact with your medicine. What should I watch for while using this medicine? Visit your doctor for checks on your progress. This drug may make you feel generally unwell. This is not uncommon, as chemotherapy can affect healthy cells as well as cancer cells. Report any side effects. Continue your course of treatment even though you feel ill unless your doctor tells you to stop. In some cases, you may be given additional medicines to help with side effects. Follow all directions for their use. Call your doctor or health care professional for advice if you get a fever, chills or sore throat, or other symptoms of a cold or flu. Do not treat yourself. This drug decreases your body's ability to fight infections. Try to avoid being around people who are sick. This medicine may increase your risk to bruise or bleed. Call your doctor or health care professional if you notice any unusual bleeding. Be careful brushing and flossing your teeth or using a toothpick because you may get an infection or bleed more easily. If you have any dental work done, tell your dentist you are receiving this medicine. Avoid taking products that contain aspirin, acetaminophen, ibuprofen, naproxen, or ketoprofen unless instructed by your doctor. These medicines may hide a fever. Do not become pregnant while taking this medicine or for 6 months after stopping it. Women should inform their doctor if they wish to become pregnant or think they might be pregnant. Men should not father a child while taking this medicine and for 3 months after stopping it.   There is a potential for serious side effects to an unborn child. Talk to your health care professional or pharmacist for more information. Do not breast-feed an infant while taking this medicine or for at least 1 week after stopping it. Men should inform their doctors if they wish  to father a child. This medicine may lower sperm counts. Talk with your doctor or health care professional if you are concerned about your fertility. What side effects may I notice from receiving this medicine? Side effects that you should report to your doctor or health care professional as soon as possible:  allergic reactions like skin rash, itching or hives, swelling of the face, lips, or tongue  breathing problems  pain, redness, or irritation at site where injected  signs and symptoms of a dangerous change in heartbeat or heart rhythm like chest pain; dizziness; fast or irregular heartbeat; palpitations; feeling faint or lightheaded, falls; breathing problems  signs of decreased platelets or bleeding - bruising, pinpoint red spots on the skin, black, tarry stools, blood in the urine  signs of decreased red blood cells - unusually weak or tired, feeling faint or lightheaded, falls  signs of infection - fever or chills, cough, sore throat, pain or difficulty passing urine  signs and symptoms of kidney injury like trouble passing urine or change in the amount of urine  signs and symptoms of liver injury like dark yellow or brown urine; general ill feeling or flu-like symptoms; light-colored stools; loss of appetite; nausea; right upper belly pain; unusually weak or tired; yellowing of the eyes or skin  swelling of ankles, feet, hands Side effects that usually do not require medical attention (report to your doctor or health care professional if they continue or are bothersome):  constipation  diarrhea  hair loss  loss of appetite  nausea  rash  vomiting This list may not describe all possible side effects. Call your doctor for medical advice about side effects. You may report side effects to FDA at 1-800-FDA-1088. Where should I keep my medicine? This drug is given in a hospital or clinic and will not be stored at home. NOTE: This sheet is a summary. It may not cover all  possible information. If you have questions about this medicine, talk to your doctor, pharmacist, or health care provider.  2020 Elsevier/Gold Standard (2018-02-10 18:06:11)  

## 2020-08-02 ENCOUNTER — Inpatient Hospital Stay: Payer: PPO | Attending: Oncology

## 2020-08-02 ENCOUNTER — Other Ambulatory Visit: Payer: Self-pay

## 2020-08-02 DIAGNOSIS — Z85828 Personal history of other malignant neoplasm of skin: Secondary | ICD-10-CM | POA: Insufficient documentation

## 2020-08-02 DIAGNOSIS — R319 Hematuria, unspecified: Secondary | ICD-10-CM | POA: Insufficient documentation

## 2020-08-02 DIAGNOSIS — Z800 Family history of malignant neoplasm of digestive organs: Secondary | ICD-10-CM | POA: Insufficient documentation

## 2020-08-02 DIAGNOSIS — I4891 Unspecified atrial fibrillation: Secondary | ICD-10-CM | POA: Insufficient documentation

## 2020-08-02 DIAGNOSIS — Z8042 Family history of malignant neoplasm of prostate: Secondary | ICD-10-CM | POA: Insufficient documentation

## 2020-08-02 DIAGNOSIS — Z5111 Encounter for antineoplastic chemotherapy: Secondary | ICD-10-CM | POA: Insufficient documentation

## 2020-08-02 DIAGNOSIS — C679 Malignant neoplasm of bladder, unspecified: Secondary | ICD-10-CM | POA: Insufficient documentation

## 2020-08-02 DIAGNOSIS — R1030 Lower abdominal pain, unspecified: Secondary | ICD-10-CM | POA: Insufficient documentation

## 2020-08-02 DIAGNOSIS — I10 Essential (primary) hypertension: Secondary | ICD-10-CM | POA: Insufficient documentation

## 2020-08-02 DIAGNOSIS — K219 Gastro-esophageal reflux disease without esophagitis: Secondary | ICD-10-CM | POA: Insufficient documentation

## 2020-08-02 DIAGNOSIS — Z87891 Personal history of nicotine dependence: Secondary | ICD-10-CM | POA: Insufficient documentation

## 2020-08-02 DIAGNOSIS — Z803 Family history of malignant neoplasm of breast: Secondary | ICD-10-CM | POA: Insufficient documentation

## 2020-08-02 DIAGNOSIS — Z7901 Long term (current) use of anticoagulants: Secondary | ICD-10-CM | POA: Insufficient documentation

## 2020-08-02 DIAGNOSIS — Z79899 Other long term (current) drug therapy: Secondary | ICD-10-CM | POA: Insufficient documentation

## 2020-08-02 DIAGNOSIS — Z8052 Family history of malignant neoplasm of bladder: Secondary | ICD-10-CM | POA: Insufficient documentation

## 2020-08-13 NOTE — Progress Notes (Signed)
Pharmacist Chemotherapy Monitoring - Initial Assessment    Anticipated start date: 08/20/20  Regimen:  . Are orders appropriate based on the patient's diagnosis, regimen, and cycle? Yes . Does the plan date match the patient's scheduled date? Yes . Is the sequencing of drugs appropriate? Yes . Are the premedications appropriate for the patient's regimen? Yes . Prior Authorization for treatment is: Approved o If applicable, is the correct biosimilar selected based on the patient's insurance? not applicable  Organ Function and Labs: Marland Kitchen Are dose adjustments needed based on the patient's renal function, hepatic function, or hematologic function? No . Are appropriate labs ordered prior to the start of patient's treatment? Yes . Other organ system assessment, if indicated: N/A . The following baseline labs, if indicated, have been ordered: N/A  Dose Assessment: . Are the drug doses appropriate? Yes . Are the following correct: o Drug concentrations Yes o IV fluid compatible with drug Yes o Administration routes Yes o Timing of therapy Yes . If applicable, does the patient have documented access for treatment and/or plans for port-a-cath placement? not applicable . If applicable, have lifetime cumulative doses been properly documented and assessed? yes Lifetime Dose Tracking  No doses have been documented on this patient for the following tracked chemicals: Doxorubicin, Epirubicin, Idarubicin, Daunorubicin, Mitoxantrone, Bleomycin, Oxaliplatin, Carboplatin, Liposomal Doxorubicin  o   Toxicity Monitoring/Prevention: . The patient has the following take home antiemetics prescribed: N/A . The patient has the following take home medications prescribed: N/A . Medication allergies and previous infusion related reactions, if applicable, have been reviewed and addressed. Yes . The patient's current medication list has been assessed for drug-drug interactions with their chemotherapy regimen. no  significant drug-drug interactions were identified on review.  Order Review: . Are the treatment plan orders signed? No . Is the patient scheduled to see a provider prior to their treatment? Yes  I verify that I have reviewed each item in the above checklist and answered each question accordingly.  Adelina Mings 08/13/2020 8:29 AM

## 2020-08-15 ENCOUNTER — Inpatient Hospital Stay: Payer: PPO

## 2020-08-20 ENCOUNTER — Encounter: Payer: Self-pay | Admitting: Oncology

## 2020-08-20 ENCOUNTER — Other Ambulatory Visit: Payer: Self-pay

## 2020-08-20 ENCOUNTER — Inpatient Hospital Stay: Payer: PPO

## 2020-08-20 ENCOUNTER — Inpatient Hospital Stay (HOSPITAL_BASED_OUTPATIENT_CLINIC_OR_DEPARTMENT_OTHER): Payer: PPO | Admitting: Oncology

## 2020-08-20 VITALS — BP 148/83 | HR 62 | Temp 95.6°F | Resp 18 | Wt 164.3 lb

## 2020-08-20 DIAGNOSIS — C679 Malignant neoplasm of bladder, unspecified: Secondary | ICD-10-CM | POA: Diagnosis not present

## 2020-08-20 DIAGNOSIS — I4891 Unspecified atrial fibrillation: Secondary | ICD-10-CM | POA: Diagnosis not present

## 2020-08-20 DIAGNOSIS — Z87891 Personal history of nicotine dependence: Secondary | ICD-10-CM | POA: Diagnosis not present

## 2020-08-20 DIAGNOSIS — K219 Gastro-esophageal reflux disease without esophagitis: Secondary | ICD-10-CM | POA: Diagnosis not present

## 2020-08-20 DIAGNOSIS — Z8042 Family history of malignant neoplasm of prostate: Secondary | ICD-10-CM | POA: Diagnosis not present

## 2020-08-20 DIAGNOSIS — Z79899 Other long term (current) drug therapy: Secondary | ICD-10-CM | POA: Diagnosis not present

## 2020-08-20 DIAGNOSIS — Z5111 Encounter for antineoplastic chemotherapy: Secondary | ICD-10-CM

## 2020-08-20 DIAGNOSIS — Z800 Family history of malignant neoplasm of digestive organs: Secondary | ICD-10-CM | POA: Diagnosis not present

## 2020-08-20 DIAGNOSIS — Z8052 Family history of malignant neoplasm of bladder: Secondary | ICD-10-CM | POA: Diagnosis not present

## 2020-08-20 DIAGNOSIS — Z7901 Long term (current) use of anticoagulants: Secondary | ICD-10-CM | POA: Diagnosis not present

## 2020-08-20 DIAGNOSIS — Z803 Family history of malignant neoplasm of breast: Secondary | ICD-10-CM | POA: Diagnosis not present

## 2020-08-20 DIAGNOSIS — Z85828 Personal history of other malignant neoplasm of skin: Secondary | ICD-10-CM | POA: Diagnosis not present

## 2020-08-20 DIAGNOSIS — I10 Essential (primary) hypertension: Secondary | ICD-10-CM | POA: Diagnosis not present

## 2020-08-20 DIAGNOSIS — R1030 Lower abdominal pain, unspecified: Secondary | ICD-10-CM | POA: Diagnosis not present

## 2020-08-20 DIAGNOSIS — R319 Hematuria, unspecified: Secondary | ICD-10-CM | POA: Diagnosis not present

## 2020-08-20 LAB — URINALYSIS, COMPLETE (UACMP) WITH MICROSCOPIC
Bacteria, UA: NONE SEEN
Bilirubin Urine: NEGATIVE
Glucose, UA: NEGATIVE mg/dL
Ketones, ur: NEGATIVE mg/dL
Nitrite: NEGATIVE
Protein, ur: NEGATIVE mg/dL
Specific Gravity, Urine: 1.012 (ref 1.005–1.030)
Squamous Epithelial / HPF: NONE SEEN (ref 0–5)
pH: 6 (ref 5.0–8.0)

## 2020-08-20 LAB — CBC WITH DIFFERENTIAL/PLATELET
Abs Immature Granulocytes: 0.02 10*3/uL (ref 0.00–0.07)
Basophils Absolute: 0 10*3/uL (ref 0.0–0.1)
Basophils Relative: 1 %
Eosinophils Absolute: 0.2 10*3/uL (ref 0.0–0.5)
Eosinophils Relative: 3 %
HCT: 40 % (ref 39.0–52.0)
Hemoglobin: 12.8 g/dL — ABNORMAL LOW (ref 13.0–17.0)
Immature Granulocytes: 0 %
Lymphocytes Relative: 33 %
Lymphs Abs: 2 10*3/uL (ref 0.7–4.0)
MCH: 27.1 pg (ref 26.0–34.0)
MCHC: 32 g/dL (ref 30.0–36.0)
MCV: 84.6 fL (ref 80.0–100.0)
Monocytes Absolute: 0.7 10*3/uL (ref 0.1–1.0)
Monocytes Relative: 11 %
Neutro Abs: 3.3 10*3/uL (ref 1.7–7.7)
Neutrophils Relative %: 52 %
Platelets: 206 10*3/uL (ref 150–400)
RBC: 4.73 MIL/uL (ref 4.22–5.81)
RDW: 14.7 % (ref 11.5–15.5)
WBC: 6.2 10*3/uL (ref 4.0–10.5)
nRBC: 0 % (ref 0.0–0.2)

## 2020-08-20 LAB — COMPREHENSIVE METABOLIC PANEL
ALT: 21 U/L (ref 0–44)
AST: 22 U/L (ref 15–41)
Albumin: 3.7 g/dL (ref 3.5–5.0)
Alkaline Phosphatase: 72 U/L (ref 38–126)
Anion gap: 7 (ref 5–15)
BUN: 16 mg/dL (ref 8–23)
CO2: 28 mmol/L (ref 22–32)
Calcium: 8.9 mg/dL (ref 8.9–10.3)
Chloride: 102 mmol/L (ref 98–111)
Creatinine, Ser: 0.9 mg/dL (ref 0.61–1.24)
GFR calc Af Amer: >60 mL/min (ref >60)
GFR calc non Af Amer: >60 mL/min (ref >60)
Glucose, Bld: 109 mg/dL — ABNORMAL HIGH (ref 70–99)
Potassium: 4.2 mmol/L (ref 3.5–5.1)
Sodium: 137 mmol/L (ref 135–145)
Total Bilirubin: 0.7 mg/dL (ref 0.3–1.2)
Total Protein: 7 g/dL (ref 6.5–8.1)

## 2020-08-20 MED ORDER — PROCHLORPERAZINE MALEATE 10 MG PO TABS
10.0000 mg | ORAL_TABLET | Freq: Once | ORAL | Status: AC
  Administered 2020-08-20: 10 mg via ORAL
  Filled 2020-08-20: qty 1

## 2020-08-20 MED ORDER — GEMCITABINE CHEMO FOR BLADDER INSTILLATION 2000 MG
2000.0000 mg | Freq: Once | INTRAVENOUS | Status: DC
  Filled 2020-08-20: qty 52.6

## 2020-08-20 NOTE — Progress Notes (Signed)
Patient here for oncology follow-up appointment, expresses no complaints or concerns at this time.    

## 2020-08-20 NOTE — Progress Notes (Signed)
Hematology/Oncology Consult note [facility] [phone] [phone]   Patient Care Team: Idelle Crouch, MD as PCP - General (Internal Medicine)  REFERRING PROVIDER: Idelle Crouch, MD  CHIEF COMPLAINTS/REASON FOR VISIT:  Evaluation of intravesical chemotherapy  HISTORY OF PRESENTING ILLNESS:   Darren Wright is a  86 y.o.  male with PMH including squamous cell carcinoma of the skin, hypertension, GERD, basal cell carcinoma, A. fib on anticoagulation, newly diagnosed nonmuscle invasive bladder cancer was seen in consultation at the request of  Idelle Crouch, MD  for evaluation of intravesical chemotherapy. Patient had CT scan done in January for evaluation of low abdominal pain and was diagnosed with diverticulitis.  Symptoms improved after antibiotics course. There was incidental finding of indeterminate soft tissue thickening at the right base of bladder.  As well as enlarged and heterogeneously enhancing prostate.  Patient denies any hematuria, difficulty urination. Patient was referred to see Dr. Bernardo Heater and patient had cystoscopy on 07/17/2020.  Intraoperative findings showed bladder tumor, involving the right posterior wall, right lateral wall and the bladder base extending to the bladder neck.  The posterior anterior dimension was 6 cm in the lateral medial dimension 5 cm.  Patient underwent TURBT for resection of the tumor. Pathology showed noninvasive papillary urothelial carcinoma, low-grade, muscularis propria present and uninvolved.   Given the large size of bladder tumor, Dr. Bernardo Heater recommend intravesical chemo Patient was referred to establish care with oncology for intravesical chemotherapy.  Patient reports feeling well today.  He has some hematuria after the surgery.  Pradaxa was resumed after surgery.  Today he reports that his urine color was normal this morning.   INTERVAL HISTORY Darren Wright is a 86 y.o. male  who has above history reviewed by me today presents for follow up visit for Intravesical gemcitabine treatments for bladder cancer  Problems and complaints are listed below:           Patient denies any urinary tract symptoms.  No dysuria, increased frequency's or hesitancy.  No fever, chills nausea, vomiting.  He has been to chemotherapy class during interval.                          Review of Systems  Constitutional: Negative for appetite change, chills, fatigue, fever and unexpected weight change.  HENT:   Negative for hearing loss and voice change.   Eyes: Negative for eye problems and icterus.  Respiratory: Negative for chest tightness, cough and shortness of breath.   Cardiovascular: Negative for chest pain and leg swelling.  Gastrointestinal: Negative for abdominal distention and abdominal pain.  Endocrine: Negative for hot flashes.  Genitourinary: Negative for difficulty urinating, dysuria and frequency.   Musculoskeletal: Negative for arthralgias.  Skin: Negative for itching and rash.  Neurological: Negative for light-headedness and numbness.  Hematological: Negative for adenopathy. Does not bruise/bleed easily.  Psychiatric/Behavioral: Negative for confusion.    MEDICAL HISTORY:  Past Medical History:  Diagnosis Date  . A-fib ([place])   . Basal cell carcinoma 12/11/2016   left medial cheek  . GERD (gastroesophageal reflux disease)   . Hypertension   . Squamous cell carcinoma of skin 03/10/2018   left crown scalp    SURGICAL HISTORY: Past Surgical History:  Procedure Laterality Date  . CHOLECYSTECTOMY    . CYSTOSCOPY W/ RETROGRADES Bilateral 07/17/2020   Procedure: CYSTOSCOPY WITH RETROGRADE PYELOGRAM;  Surgeon: Abbie Sons, MD;  Location: [facility] ORS;  Service:  Urology;  Laterality: Bilateral;  . TONSILLECTOMY    . TRANSURETHRAL RESECTION OF BLADDER TUMOR WITH MITOMYCIN-C N/A 07/17/2020   Procedure: TRANSURETHRAL RESECTION OF BLADDER TUMOR WITH gemcitabine;  Surgeon:  Abbie Sons, MD;  Location: [facility] ORS;  Service: Urology;  Laterality: N/A;    SOCIAL HISTORY: Social History   Socioeconomic History  . Marital status: Married    Spouse name: Not on file  . Number of children: Not on file  . Years of education: Not on file  . Highest education level: Not on file  Occupational History  . Not on file  Tobacco Use  . Smoking status: Former Smoker    Types: Cigarettes    Quit date: 1985    Years since quitting: 36.7  . Smokeless tobacco: Never Used  Substance and Sexual Activity  . Alcohol use: Not Currently  . Drug use: Never  . Sexual activity: Not on file  Other Topics Concern  . Not on file  Social History Narrative  . Not on file   Social Determinants of Health   Financial Resource Strain:   . Difficulty of Paying Living Expenses: Not on file  Food Insecurity:   . Worried About Charity fundraiser in the Last Year: Not on file  . Ran Out of Food in the Last Year: Not on file  Transportation Needs:   . Lack of Transportation (Medical): Not on file  . Lack of Transportation (Non-Medical): Not on file  Physical Activity:   . Days of Exercise per Week: Not on file  . Minutes of Exercise per Session: Not on file  Stress:   . Feeling of Stress : Not on file  Social Connections:   . Frequency of Communication with Friends and Family: Not on file  . Frequency of Social Gatherings with Friends and Family: Not on file  . Attends Religious Services: Not on file  . Active Member of Clubs or Organizations: Not on file  . Attends Archivist Meetings: Not on file  . Marital Status: Not on file  Intimate Partner Violence:   . Fear of Current or Ex-Partner: Not on file  . Emotionally Abused: Not on file  . Physically Abused: Not on file  . Sexually Abused: Not on file    FAMILY HISTORY: Family History  Problem Relation Age of Onset  . Pancreatic cancer Mother   . Prostate cancer Father   . Breast cancer Sister   .  Bladder Cancer Sister     ALLERGIES:  is allergic to ibuprofen and penicillins.  MEDICATIONS:  Current Outpatient Medications  Medication Sig Dispense Refill  . benazepril-hydrochlorthiazide (LOTENSIN HCT) 20-12.5 MG tablet Take 1 tablet by mouth daily.     . cyanocobalamin 1000 MCG tablet Take 1,000 mcg by mouth daily.     . dabigatran (PRADAXA) 150 MG CAPS capsule Take 150 mg by mouth 2 (two) times daily.     . Multiple Vitamins-Minerals (PRESERVISION AREDS 2 PO) Take 1 capsule by mouth in the morning and at bedtime.    . pantoprazole (PROTONIX) 40 MG tablet Take 40 mg by mouth daily.     . sildenafil (REVATIO) 20 MG tablet 3-5 tablets daily as needed (Patient not taking: Reported on 07/30/2020)    . tamsulosin (FLOMAX) 0.4 MG CAPS capsule Take 0.4 mg by mouth at bedtime.     . timolol (TIMOPTIC) 0.5 % ophthalmic solution Place 1 drop into the right eye daily.    . trospium (SANCTURA) 20  MG tablet 1 tab twice daily as needed for catheter irritation 14 tablet 0   No current facility-administered medications for this visit.     PHYSICAL EXAMINATION: ECOG PERFORMANCE STATUS: 1 - Symptomatic but completely ambulatory There were no vitals filed for this visit. There were no vitals filed for this visit.  Physical Exam Constitutional:      General: He is not in acute distress. HENT:     Head: Normocephalic and atraumatic.  Eyes:     General: No scleral icterus. Cardiovascular:     Rate and Rhythm: Normal rate and regular rhythm.     Heart sounds: Normal heart sounds.  Pulmonary:     Effort: Pulmonary effort is normal. No respiratory distress.     Breath sounds: No wheezing.  Abdominal:     General: Bowel sounds are normal. There is no distension.     Palpations: Abdomen is soft.  Musculoskeletal:        General: No deformity. Normal range of motion.     Cervical back: Normal range of motion and neck supple.  Skin:    General: Skin is warm and dry.     Findings: No erythema  or rash.  Neurological:     Mental Status: He is alert and oriented to person, place, and time. Mental status is at baseline.     Cranial Nerves: No cranial nerve deficit.     Coordination: Coordination normal.  Psychiatric:        Mood and Affect: Mood normal.     LABORATORY DATA:  I have reviewed the data as listed Lab Results  Component Value Date   WBC 11.7 (H) 06/10/2020   HGB 12.5 (L) 06/10/2020   HCT 38.4 (L) 06/10/2020   MCV 84.2 06/10/2020   PLT 204 06/10/2020   Recent Labs    06/10/20 1902  NA 133*  K 3.6  CL 99  CO2 24  GLUCOSE 147*  BUN 21  CREATININE 1.06  CALCIUM 8.7*  GFRNONAA >60  GFRAA >60  PROT 7.0  ALBUMIN 3.5  AST 21  ALT 20  ALKPHOS 81  BILITOT 1.1   Iron/TIBC/Ferritin/ %Sat No results found for: IRON, TIBC, FERRITIN, IRONPCTSAT    RADIOGRAPHIC STUDIES: I have personally reviewed the radiological images as listed and agreed with the findings in the report. No results found.    ASSESSMENT & PLAN:  1. Urothelial carcinoma of bladder ([place])   2. Encounter for antineoplastic chemotherapy    Pathology was reviewed and discussed with patient. I agree with Dr. Bernardo Heater that given the patient's tumor size is more than 5 cm, patient belongs to intermediate risk group Intravesical therapy is suggested following TURBT.  Recommend 6 weekly treatment of gemcitabine  Labs reviewed and discussed with patient. We also talked about the potential side effects of intravesical chemotherapy today.  Patient agrees with the plan .  Proceed with cycle 1 intravesical gemcitabine.  #I discussed with nurse practitioner Anderson Malta who informs me that she had another nurse have experienced difficulties inserting Foley catheter after multiple attempts.  Anderson Malta will reach out to urology and ask urologist to put a catheter for [place].  We will not do intravesical gemcitabine today. Patient also developed mild traumatic hematuria secondary to Foley catheter attempts.   Continue to monitor. Supportive care measures are necessary for patient well-being and will be provided as necessary. We spent sufficient time to discuss many aspect of care, questions were answered to patient's satisfaction.   Orders Placed This Encounter  Procedures  . Comprehensive metabolic panel    Standing Status:   Future    Standing Expiration Date:   08/20/2021  . CBC with Differential/Platelet    Standing Status:   Future    Standing Expiration Date:   08/20/2021  . Urinalysis, Complete w Microscopic    Standing Status:   Future    Standing Expiration Date:   08/20/2021    All questions were answered. The patient knows to call the clinic with any problems questions or concerns.  cc Idelle Crouch, MD    Return of visit: 1 week   Earlie Server, MD, PhD Hematology Oncology [facility] Pager- [phone] 08/20/2020

## 2020-08-20 NOTE — Progress Notes (Signed)
Small trace of Hgb and Leukocytes in urine, Per Duwayne Heck CMA per Dr. Tasia Catchings okay to proceed with treatment as scheduled.   0930: myself and Cesalea RN present in room for foley insertion.  Resistance was met while inserting foley, attempt to insert foley immediately stopped and once catheter was pulled out, blood was noticed in tubing and blood noted to be draining from penis. Pt stated that he did experience discomfort during procedure.  Faythe Casa NP aware.  1035: Faythe Casa and myself present for foley insertion. Faythe Casa NP inserted foley, small amount of clear fluid noted in tubing but no significant drainage at this time. Pt denies any pain at this time.    Per Anderson Malta NP monitor foley for output prior to injecting Gemzar.  1050: Pt reports that he had a BM and that it is abnormal for him to not have control over his stools and that it has not been loose up until this moment. small amount of blood noted in tubing, no other drainage noted at this time. Sheets and patient's clothing changed and patient cleaned with soap and water.   1100: Faythe Casa NP at bedside, pt using bedpan at this time. After cleaning, Anderson Malta NP assessed foley. Per Anderson Malta NP, remove foley at this time.  Balloon deflated, and once catheter removed, blood tinged urine gushed out from penis. Pt denies any pain at this time.  1130: Per Dr. Tasia Catchings okay to discharge pt home, once there is a plan made with urology, pt will be notified. Pt aware.  Pt educated to seek care if any blood clots or heavy bleeding noted, pt verbalizes understanding.

## 2020-08-23 ENCOUNTER — Inpatient Hospital Stay: Payer: PPO

## 2020-08-23 ENCOUNTER — Other Ambulatory Visit: Payer: Self-pay

## 2020-08-23 ENCOUNTER — Ambulatory Visit (INDEPENDENT_AMBULATORY_CARE_PROVIDER_SITE_OTHER): Payer: PPO

## 2020-08-23 VITALS — BP 163/96 | HR 94 | Temp 96.3°F | Resp 20 | Wt 164.0 lb

## 2020-08-23 DIAGNOSIS — C679 Malignant neoplasm of bladder, unspecified: Secondary | ICD-10-CM

## 2020-08-23 DIAGNOSIS — Z5111 Encounter for antineoplastic chemotherapy: Secondary | ICD-10-CM | POA: Diagnosis not present

## 2020-08-23 MED ORDER — PROCHLORPERAZINE MALEATE 10 MG PO TABS
10.0000 mg | ORAL_TABLET | Freq: Once | ORAL | Status: AC
  Administered 2020-08-23: 10 mg via ORAL
  Filled 2020-08-23: qty 1

## 2020-08-23 MED ORDER — GEMCITABINE CHEMO FOR BLADDER INSTILLATION 2000 MG
2000.0000 mg | Freq: Once | INTRAVENOUS | Status: AC
  Administered 2020-08-23: 2000 mg via INTRAVESICAL
  Filled 2020-08-23: qty 52.6

## 2020-08-23 NOTE — Progress Notes (Signed)
Simple Catheter Placement  Due to bladder cancer treatment therapy is present today for a foley cath placement.  Patient was cleaned and prepped in a sterile fashion with betadine. A 16 coude FR foley catheter was inserted, urine return was noted  126ml, urine was yellow in color.  The balloon was filled with 10cc of sterile water. Cath was plugged and attached to leg. Patient left office to follow up for treatment at cancer center, cath will be removed by their office after treament  Performed by: Fonnie Jarvis, CMA

## 2020-08-23 NOTE — Addendum Note (Signed)
Addended by: Earlie Server on: 08/23/2020 10:59 AM   Modules accepted: Orders

## 2020-08-23 NOTE — Progress Notes (Signed)
Patient able to tolerate holding gemzar for 45 minutes

## 2020-08-24 ENCOUNTER — Telehealth: Payer: Self-pay

## 2020-08-24 NOTE — Telephone Encounter (Signed)
Telephone call to patient for follow up after receiving first infusion.   Patient states infusion went great.  States eating good and drinking plenty of fluids.   Denies any nausea or vomiting.  Encouraged patient to call for any concerns or questions. 

## 2020-08-27 ENCOUNTER — Inpatient Hospital Stay: Payer: PPO | Admitting: Oncology

## 2020-08-27 ENCOUNTER — Inpatient Hospital Stay: Payer: PPO

## 2020-08-27 ENCOUNTER — Ambulatory Visit: Payer: PPO

## 2020-08-29 ENCOUNTER — Other Ambulatory Visit: Payer: Self-pay

## 2020-08-29 DIAGNOSIS — C679 Malignant neoplasm of bladder, unspecified: Secondary | ICD-10-CM

## 2020-08-29 NOTE — Progress Notes (Signed)
Simple Catheter Placement  Due to urinary retention patient is present today for a foley cath placement.  Patient was cleaned and prepped in a sterile fashion with betadine. A 18 FR foley catheter was inserted, urine return was noted  70 ml, urine was yellow in color.  The balloon was filled with 10cc of sterile water. Urine was obtained for UA and catheter was plugged. Catheter will be removed after bladder installation at cancer center. Patient tolerated well, no complications were noted   Performed by: Zara Council, PA-C.  Additional notes/ Follow up: Patient was provided with Gemtesa samples.  UA nitrite negative, 11-30 WBC's, 11-30 RBC's and moderate bacteria.

## 2020-08-30 ENCOUNTER — Inpatient Hospital Stay (HOSPITAL_BASED_OUTPATIENT_CLINIC_OR_DEPARTMENT_OTHER): Payer: PPO | Admitting: Oncology

## 2020-08-30 ENCOUNTER — Ambulatory Visit (INDEPENDENT_AMBULATORY_CARE_PROVIDER_SITE_OTHER): Payer: PPO | Admitting: Urology

## 2020-08-30 ENCOUNTER — Encounter: Payer: Self-pay | Admitting: Urology

## 2020-08-30 ENCOUNTER — Inpatient Hospital Stay: Payer: PPO

## 2020-08-30 ENCOUNTER — Other Ambulatory Visit: Payer: Self-pay

## 2020-08-30 ENCOUNTER — Encounter: Payer: Self-pay | Admitting: Oncology

## 2020-08-30 VITALS — BP 176/78 | HR 73 | Resp 18

## 2020-08-30 VITALS — BP 167/80 | HR 63 | Temp 97.6°F | Resp 18 | Wt 165.9 lb

## 2020-08-30 VITALS — BP 193/96 | HR 82 | Ht 70.08 in | Wt 164.9 lb

## 2020-08-30 DIAGNOSIS — C679 Malignant neoplasm of bladder, unspecified: Secondary | ICD-10-CM

## 2020-08-30 DIAGNOSIS — N3281 Overactive bladder: Secondary | ICD-10-CM | POA: Insufficient documentation

## 2020-08-30 DIAGNOSIS — Z7189 Other specified counseling: Secondary | ICD-10-CM

## 2020-08-30 DIAGNOSIS — Z5111 Encounter for antineoplastic chemotherapy: Secondary | ICD-10-CM | POA: Insufficient documentation

## 2020-08-30 LAB — CBC WITH DIFFERENTIAL/PLATELET
Abs Immature Granulocytes: 0.02 10*3/uL (ref 0.00–0.07)
Basophils Absolute: 0 10*3/uL (ref 0.0–0.1)
Basophils Relative: 1 %
Eosinophils Absolute: 0.1 10*3/uL (ref 0.0–0.5)
Eosinophils Relative: 3 %
HCT: 37.3 % — ABNORMAL LOW (ref 39.0–52.0)
Hemoglobin: 12.3 g/dL — ABNORMAL LOW (ref 13.0–17.0)
Immature Granulocytes: 0 %
Lymphocytes Relative: 29 %
Lymphs Abs: 1.4 10*3/uL (ref 0.7–4.0)
MCH: 27.3 pg (ref 26.0–34.0)
MCHC: 33 g/dL (ref 30.0–36.0)
MCV: 82.7 fL (ref 80.0–100.0)
Monocytes Absolute: 0.4 10*3/uL (ref 0.1–1.0)
Monocytes Relative: 9 %
Neutro Abs: 2.8 10*3/uL (ref 1.7–7.7)
Neutrophils Relative %: 58 %
Platelets: 193 10*3/uL (ref 150–400)
RBC: 4.51 MIL/uL (ref 4.22–5.81)
RDW: 14.8 % (ref 11.5–15.5)
WBC: 4.8 10*3/uL (ref 4.0–10.5)
nRBC: 0 % (ref 0.0–0.2)

## 2020-08-30 LAB — URINALYSIS, COMPLETE
Bilirubin, UA: NEGATIVE
Glucose, UA: NEGATIVE
Ketones, UA: NEGATIVE
Nitrite, UA: NEGATIVE
Protein,UA: NEGATIVE
Specific Gravity, UA: 1.025 (ref 1.005–1.030)
Urobilinogen, Ur: 0.2 mg/dL (ref 0.2–1.0)
pH, UA: 6 (ref 5.0–7.5)

## 2020-08-30 LAB — COMPREHENSIVE METABOLIC PANEL
ALT: 20 U/L (ref 0–44)
AST: 21 U/L (ref 15–41)
Albumin: 3.6 g/dL (ref 3.5–5.0)
Alkaline Phosphatase: 85 U/L (ref 38–126)
Anion gap: 7 (ref 5–15)
BUN: 19 mg/dL (ref 8–23)
CO2: 29 mmol/L (ref 22–32)
Calcium: 9.2 mg/dL (ref 8.9–10.3)
Chloride: 104 mmol/L (ref 98–111)
Creatinine, Ser: 0.94 mg/dL (ref 0.61–1.24)
GFR calc Af Amer: >60 mL/min (ref >60)
GFR calc non Af Amer: >60 mL/min (ref >60)
Glucose, Bld: 109 mg/dL — ABNORMAL HIGH (ref 70–99)
Potassium: 4.5 mmol/L (ref 3.5–5.1)
Sodium: 140 mmol/L (ref 135–145)
Total Bilirubin: 0.8 mg/dL (ref 0.3–1.2)
Total Protein: 7.1 g/dL (ref 6.5–8.1)

## 2020-08-30 LAB — MICROSCOPIC EXAMINATION

## 2020-08-30 MED ORDER — OXYBUTYNIN CHLORIDE 5 MG PO TABS
5.0000 mg | ORAL_TABLET | Freq: Once | ORAL | Status: AC | PRN
  Administered 2020-08-30: 5 mg via ORAL
  Filled 2020-08-30: qty 1

## 2020-08-30 MED ORDER — GEMTESA 75 MG PO TABS: 75.0000 mg | ORAL_TABLET | Freq: Every day | ORAL | 0 refills | Status: DC

## 2020-08-30 MED ORDER — GEMCITABINE CHEMO FOR BLADDER INSTILLATION 2000 MG
2000.0000 mg | Freq: Once | INTRAVENOUS | Status: AC
  Administered 2020-08-30: 2000 mg via INTRAVESICAL
  Filled 2020-08-30: qty 52.6

## 2020-08-30 MED ORDER — PROCHLORPERAZINE MALEATE 10 MG PO TABS
10.0000 mg | ORAL_TABLET | Freq: Once | ORAL | Status: AC
  Administered 2020-08-30: 10 mg via ORAL
  Filled 2020-08-30: qty 1

## 2020-08-30 NOTE — Progress Notes (Addendum)
Hematology/Oncology  Follow up note [facility] Telephone:([phone] Fax:(336) [phone]   Patient Care Team: Idelle Crouch, MD as PCP - General (Internal Medicine)  REFERRING PROVIDER: Idelle Crouch, MD  CHIEF COMPLAINTS/REASON FOR VISIT:  Follow up for intravesical chemotherapy  HISTORY OF PRESENTING ILLNESS:   Darren Wright is a  86 y.o.  male with PMH including squamous cell carcinoma of the skin, hypertension, GERD, basal cell carcinoma, A. fib on anticoagulation, newly diagnosed nonmuscle invasive bladder cancer was seen in consultation at the request of  Idelle Crouch, MD  for evaluation of intravesical chemotherapy. Patient had CT scan done in January for evaluation of low abdominal pain and was diagnosed with diverticulitis.  Symptoms improved after antibiotics course. There was incidental finding of indeterminate soft tissue thickening at the right base of bladder.  As well as enlarged and heterogeneously enhancing prostate.  Patient denies any hematuria, difficulty urination. Patient was referred to see Dr. Bernardo Heater and patient had cystoscopy on 07/17/2020.  Intraoperative findings showed bladder tumor, involving the right posterior wall, right lateral wall and the bladder base extending to the bladder neck.  The posterior anterior dimension was 6 cm in the lateral medial dimension 5 cm.  Patient underwent TURBT for resection of the tumor. Pathology showed noninvasive papillary urothelial carcinoma, low-grade, muscularis propria present and uninvolved.   Given the large size of bladder tumor, Dr. Bernardo Heater recommend intravesical chemo Patient was referred to establish care with oncology for intravesical chemotherapy.  Patient reports feeling well today.  He has some hematuria after the surgery.  Pradaxa was resumed after surgery.  Today he reports that his urine color was normal this morning.   INTERVAL HISTORY Darren Wright is a 86 y.o. male  who has above history reviewed by me today presents for follow up visit for Intravesical gemcitabine treatments for bladder cancer  Problems and complaints are listed below:      Patient had Foley catheter placed at urologist office this morning.  UA was done at Dr. Dene Gentry office.  I have preliminary result with negative nitrate, WBC 11-30, RBC 11-30, epithelial cells 0-10, moderate bacteria. Patient reports that he tolerates last intravesical gemcitabine treatment.  He had an issue holding for more than an hour.  Dr. Bernardo Heater recommends patient to try Ditropan  Patient reports mild symptoms of dysuria before catheter is placed this morning.  No fever, chills, abdominal pain                          Review of Systems  Constitutional: Negative for appetite change, chills, fatigue, fever and unexpected weight change.  HENT:   Negative for hearing loss and voice change.   Eyes: Negative for eye problems and icterus.  Respiratory: Negative for chest tightness, cough and shortness of breath.   Cardiovascular: Negative for chest pain and leg swelling.  Gastrointestinal: Negative for abdominal distention and abdominal pain.  Endocrine: Negative for hot flashes.  Genitourinary: Negative for difficulty urinating, dysuria and frequency.   Musculoskeletal: Negative for arthralgias.  Skin: Negative for itching and rash.  Neurological: Negative for light-headedness and numbness.  Hematological: Negative for adenopathy. Does not bruise/bleed easily.  Psychiatric/Behavioral: Negative for confusion.    MEDICAL HISTORY:  Past Medical History:  Diagnosis Date  . A-fib ([place])   . Basal cell carcinoma 12/11/2016   left medial cheek  . GERD (gastroesophageal reflux disease)   . Hypertension   . Squamous cell  carcinoma of skin 03/10/2018   left crown scalp    SURGICAL HISTORY: Past Surgical History:  Procedure Laterality Date  . CHOLECYSTECTOMY    . CYSTOSCOPY W/ RETROGRADES Bilateral 07/17/2020    Procedure: CYSTOSCOPY WITH RETROGRADE PYELOGRAM;  Surgeon: Abbie Sons, MD;  Location: [facility] ORS;  Service: Urology;  Laterality: Bilateral;  . TONSILLECTOMY    . TRANSURETHRAL RESECTION OF BLADDER TUMOR WITH MITOMYCIN-C N/A 07/17/2020   Procedure: TRANSURETHRAL RESECTION OF BLADDER TUMOR WITH gemcitabine;  Surgeon: Abbie Sons, MD;  Location: [facility] ORS;  Service: Urology;  Laterality: N/A;    SOCIAL HISTORY: Social History   Socioeconomic History  . Marital status: Married    Spouse name: Not on file  . Number of children: Not on file  . Years of education: Not on file  . Highest education level: Not on file  Occupational History  . Not on file  Tobacco Use  . Smoking status: Former Smoker    Types: Cigarettes    Quit date: 1985    Years since quitting: 36.7  . Smokeless tobacco: Never Used  Substance and Sexual Activity  . Alcohol use: Not Currently  . Drug use: Never  . Sexual activity: Not on file  Other Topics Concern  . Not on file  Social History Narrative  . Not on file   Social Determinants of Health   Financial Resource Strain:   . Difficulty of Paying Living Expenses: Not on file  Food Insecurity:   . Worried About Charity fundraiser in the Last Year: Not on file  . Ran Out of Food in the Last Year: Not on file  Transportation Needs:   . Lack of Transportation (Medical): Not on file  . Lack of Transportation (Non-Medical): Not on file  Physical Activity:   . Days of Exercise per Week: Not on file  . Minutes of Exercise per Session: Not on file  Stress:   . Feeling of Stress : Not on file  Social Connections:   . Frequency of Communication with Friends and Family: Not on file  . Frequency of Social Gatherings with Friends and Family: Not on file  . Attends Religious Services: Not on file  . Active Member of Clubs or Organizations: Not on file  . Attends Archivist Meetings: Not on file  . Marital Status: Not on file  Intimate Partner  Violence:   . Fear of Current or Ex-Partner: Not on file  . Emotionally Abused: Not on file  . Physically Abused: Not on file  . Sexually Abused: Not on file    FAMILY HISTORY: Family History  Problem Relation Age of Onset  . Pancreatic cancer Mother   . Prostate cancer Father   . Breast cancer Sister   . Bladder Cancer Sister     ALLERGIES:  is allergic to ibuprofen and penicillins.  MEDICATIONS:  Current Outpatient Medications  Medication Sig Dispense Refill  . benazepril-hydrochlorthiazide (LOTENSIN HCT) 20-12.5 MG tablet Take 1 tablet by mouth daily.     . cyanocobalamin 1000 MCG tablet Take 1,000 mcg by mouth daily.     . dabigatran (PRADAXA) 150 MG CAPS capsule Take 150 mg by mouth 2 (two) times daily.     . Multiple Vitamins-Minerals (PRESERVISION AREDS 2 PO) Take 1 capsule by mouth in the morning and at bedtime.     . pantoprazole (PROTONIX) 40 MG tablet Take 40 mg by mouth daily.     . tamsulosin (FLOMAX) 0.4 MG CAPS capsule  Take 0.4 mg by mouth at bedtime.     . timolol (TIMOPTIC) 0.5 % ophthalmic solution Place 1 drop into the right eye daily.    . Vibegron (GEMTESA) 75 MG TABS Take 75 mg by mouth daily. 28 tablet 0  . sildenafil (REVATIO) 20 MG tablet 3-5 tablets daily as needed (Patient not taking: Reported on 08/30/2020)     No current facility-administered medications for this visit.     PHYSICAL EXAMINATION: ECOG PERFORMANCE STATUS: 1 - Symptomatic but completely ambulatory Vitals:   08/30/20 0924  BP: (!) 167/80  Pulse: 63  Resp: 18  Temp: 97.6 F (36.4 C)   Filed Weights   08/30/20 0924  Weight: 165 lb 14.4 oz (75.3 kg)    Physical Exam Constitutional:      General: He is not in acute distress. HENT:     Head: Normocephalic and atraumatic.  Eyes:     General: No scleral icterus. Cardiovascular:     Rate and Rhythm: Normal rate and regular rhythm.     Heart sounds: Normal heart sounds.  Pulmonary:     Effort: Pulmonary effort is normal. No  respiratory distress.     Breath sounds: No wheezing.  Abdominal:     General: Bowel sounds are normal. There is no distension.     Palpations: Abdomen is soft.  Musculoskeletal:        General: No deformity. Normal range of motion.     Cervical back: Normal range of motion and neck supple.  Skin:    General: Skin is warm and dry.     Findings: No erythema or rash.  Neurological:     Mental Status: He is alert and oriented to person, place, and time. Mental status is at baseline.     Cranial Nerves: No cranial nerve deficit.     Coordination: Coordination normal.  Psychiatric:        Mood and Affect: Mood normal.     LABORATORY DATA:  I have reviewed the data as listed Lab Results  Component Value Date   WBC 4.8 08/30/2020   HGB 12.3 (L) 08/30/2020   HCT 37.3 (L) 08/30/2020   MCV 82.7 08/30/2020   PLT 193 08/30/2020   Recent Labs    06/10/20 1902 08/20/20 0813 08/30/20 0901  NA 133* 137 140  K 3.6 4.2 4.5  CL 99 102 104  CO2 24 28 29   GLUCOSE 147* 109* 109*  BUN 21 16 19   CREATININE 1.06 0.90 0.94  CALCIUM 8.7* 8.9 9.2  GFRNONAA >60 >60 >60  GFRAA >60 >60 >60  PROT 7.0 7.0 7.1  ALBUMIN 3.5 3.7 3.6  AST 21 22 21   ALT 20 21 20   ALKPHOS 81 72 85  BILITOT 1.1 0.7 0.8   Iron/TIBC/Ferritin/ %Sat No results found for: IRON, TIBC, FERRITIN, IRONPCTSAT    RADIOGRAPHIC STUDIES: I have personally reviewed the radiological images as listed and agreed with the findings in the report. No results found.    ASSESSMENT & PLAN:  1. Urothelial carcinoma of bladder ([place])   2. Overactive bladder   3. Encounter for antineoplastic chemotherapy   4. Goals of care, counseling/discussion    Bladder urothelial carcinoma tumor size is more than 5 cm, intermediate risk group Intravesical therapy is suggested following TURBT.  Recommend 6 weekly treatment of gemcitabine  Labs are reviewed and discussed with patient. Counts acceptable. Patient has tolerated cycle one  intravesical gemcitabine last week. Proceed with cycle two treatment today. Nurse reports later  that continue follow-up with urology was only able to hold treatment for about 35 minutes.   #Urinary symptoms-overreactive bladder Discussed with patient that the symptoms may be secondary to intravesical chemotherapy as well as infection.  I will obtain a culture of the urine. Continue follow-up with urology.  Continue Gemtesa for overreactive bladder.  Discussed with [facility] from Urology. Patient will get UA every week at Urologist's office.   . Supportive care measures are necessary for patient well-being and will be provided as necessary. We spent sufficient time to discuss many aspect of care, questions were answered to patient's satisfaction.   Orders Placed This Encounter  Procedures  . Urine Culture    Standing Status:   Future    Number of Occurrences:   1    Standing Expiration Date:   08/30/2021    All questions were answered. The patient knows to call the clinic with any problems questions or concerns.  cc Idelle Crouch, MD    Return of visit: 1 week   Earlie Server, MD, PhD Hematology Oncology [facility] Pager- [phone] 08/30/2020   Addendum 09/01/2020 Urine culture is positive for E coli faecalis. Called patient and advise him to take Amoxcillin 500mg  TID for 5 days. Confirmed with him and wife that he is not allergic to Amoxcillin. Rx sent to his pharmacy  Earlie Server

## 2020-08-30 NOTE — Progress Notes (Signed)
Final BP 176/78. Dr. Tasia Catchings and team made aware. Patient discharged home.

## 2020-08-30 NOTE — Progress Notes (Signed)
UA collected at Urology office. Per Dr. Tasia Catchings, results have been reviewed and okay to proceed with treatment.   Patient only able to hold bladder for approx 35 minutes today despite taking Ditropan 30 minutes prior to receiving treatment. Patient started to c/o extreme pressure with BM and urination. Urine then started to leak around catheter. Catheter unclamped at 1147 to allow drainage. Per patient pressure started suddenly and was unable to hold BM. Dr. Tasia Catchings and team made aware.

## 2020-08-30 NOTE — Progress Notes (Signed)
Patient had cath placed at Dr. Alphonsa Overall office this morning and was given samples of Vibegron for bladder spasms.

## 2020-08-31 ENCOUNTER — Telehealth: Payer: Self-pay

## 2020-08-31 NOTE — Telephone Encounter (Signed)
While here for tx yesterday patient asked if he should take the new medication Gemtesa for bladder spasms given by urologist yesterday.  Dr. Tasia Catchings recommends him to take [facility] daily and will receive Ditropan on tx days.  Patient notified and voiced understanding.

## 2020-09-01 ENCOUNTER — Telehealth: Payer: Self-pay | Admitting: Internal Medicine

## 2020-09-01 LAB — URINE CULTURE: Culture: >=100000 — AB

## 2020-09-01 MED ORDER — AMOXICILLIN 500 MG PO CAPS: 500.0000 mg | ORAL_CAPSULE | Freq: Three times a day (TID) | ORAL | 0 refills | Status: DC

## 2020-09-01 MED ORDER — NITROFURANTOIN MONOHYD MACRO 100 MG PO CAPS: 100.0000 mg | ORAL_CAPSULE | Freq: Two times a day (BID) | ORAL | 0 refills | Status: DC

## 2020-09-01 NOTE — Addendum Note (Signed)
Addended by: Earlie Server on: 09/01/2020 09:47 AM   Modules accepted: Orders

## 2020-09-01 NOTE — Telephone Encounter (Signed)
10/02 spoke to wife re: allergy to pennicillin.[facility] and swelling as a child]  Will call instead macrobid.   GB

## 2020-09-03 NOTE — Telephone Encounter (Signed)
Thanks

## 2020-09-04 DIAGNOSIS — H353211 Exudative age-related macular degeneration, right eye, with active choroidal neovascularization: Secondary | ICD-10-CM | POA: Diagnosis not present

## 2020-09-06 ENCOUNTER — Encounter: Payer: Self-pay | Admitting: Oncology

## 2020-09-06 ENCOUNTER — Other Ambulatory Visit: Payer: Self-pay | Admitting: Oncology

## 2020-09-06 ENCOUNTER — Encounter: Payer: Self-pay | Admitting: Physician Assistant

## 2020-09-06 ENCOUNTER — Telehealth: Payer: Self-pay

## 2020-09-06 ENCOUNTER — Ambulatory Visit (INDEPENDENT_AMBULATORY_CARE_PROVIDER_SITE_OTHER): Payer: PPO | Admitting: Physician Assistant

## 2020-09-06 ENCOUNTER — Inpatient Hospital Stay (HOSPITAL_BASED_OUTPATIENT_CLINIC_OR_DEPARTMENT_OTHER): Payer: PPO | Admitting: Oncology

## 2020-09-06 ENCOUNTER — Inpatient Hospital Stay: Payer: PPO

## 2020-09-06 ENCOUNTER — Inpatient Hospital Stay: Payer: PPO | Attending: Oncology

## 2020-09-06 ENCOUNTER — Other Ambulatory Visit: Payer: Self-pay

## 2020-09-06 VITALS — BP 154/83 | HR 64 | Temp 97.2°F | Resp 18 | Wt 163.4 lb

## 2020-09-06 DIAGNOSIS — Z800 Family history of malignant neoplasm of digestive organs: Secondary | ICD-10-CM | POA: Diagnosis not present

## 2020-09-06 DIAGNOSIS — Z5111 Encounter for antineoplastic chemotherapy: Secondary | ICD-10-CM

## 2020-09-06 DIAGNOSIS — Z803 Family history of malignant neoplasm of breast: Secondary | ICD-10-CM | POA: Insufficient documentation

## 2020-09-06 DIAGNOSIS — I10 Essential (primary) hypertension: Secondary | ICD-10-CM | POA: Insufficient documentation

## 2020-09-06 DIAGNOSIS — I4891 Unspecified atrial fibrillation: Secondary | ICD-10-CM | POA: Diagnosis not present

## 2020-09-06 DIAGNOSIS — Z87891 Personal history of nicotine dependence: Secondary | ICD-10-CM | POA: Insufficient documentation

## 2020-09-06 DIAGNOSIS — Z880 Allergy status to penicillin: Secondary | ICD-10-CM | POA: Insufficient documentation

## 2020-09-06 DIAGNOSIS — Z7901 Long term (current) use of anticoagulants: Secondary | ICD-10-CM | POA: Diagnosis not present

## 2020-09-06 DIAGNOSIS — C679 Malignant neoplasm of bladder, unspecified: Secondary | ICD-10-CM

## 2020-09-06 DIAGNOSIS — Z79899 Other long term (current) drug therapy: Secondary | ICD-10-CM | POA: Insufficient documentation

## 2020-09-06 DIAGNOSIS — N3281 Overactive bladder: Secondary | ICD-10-CM | POA: Insufficient documentation

## 2020-09-06 DIAGNOSIS — Z8049 Family history of malignant neoplasm of other genital organs: Secondary | ICD-10-CM | POA: Insufficient documentation

## 2020-09-06 DIAGNOSIS — Z9049 Acquired absence of other specified parts of digestive tract: Secondary | ICD-10-CM | POA: Insufficient documentation

## 2020-09-06 DIAGNOSIS — K219 Gastro-esophageal reflux disease without esophagitis: Secondary | ICD-10-CM | POA: Diagnosis not present

## 2020-09-06 DIAGNOSIS — Z8042 Family history of malignant neoplasm of prostate: Secondary | ICD-10-CM | POA: Insufficient documentation

## 2020-09-06 DIAGNOSIS — Z85828 Personal history of other malignant neoplasm of skin: Secondary | ICD-10-CM | POA: Insufficient documentation

## 2020-09-06 DIAGNOSIS — Z886 Allergy status to analgesic agent status: Secondary | ICD-10-CM | POA: Insufficient documentation

## 2020-09-06 DIAGNOSIS — D72819 Decreased white blood cell count, unspecified: Secondary | ICD-10-CM | POA: Insufficient documentation

## 2020-09-06 LAB — URINALYSIS, COMPLETE (UACMP) WITH MICROSCOPIC
Bacteria, UA: NONE SEEN
Bilirubin Urine: NEGATIVE
Glucose, UA: NEGATIVE mg/dL
Ketones, ur: NEGATIVE mg/dL
Leukocytes,Ua: NEGATIVE
Nitrite: NEGATIVE
Protein, ur: NEGATIVE mg/dL
Specific Gravity, Urine: 1.009 (ref 1.005–1.030)
Squamous Epithelial / HPF: NONE SEEN (ref 0–5)
pH: 7 (ref 5.0–8.0)

## 2020-09-06 LAB — CBC WITH DIFFERENTIAL/PLATELET
Abs Immature Granulocytes: 0.01 10*3/uL (ref 0.00–0.07)
Basophils Absolute: 0 10*3/uL (ref 0.0–0.1)
Basophils Relative: 1 %
Eosinophils Absolute: 0.1 10*3/uL (ref 0.0–0.5)
Eosinophils Relative: 3 %
HCT: 38.9 % — ABNORMAL LOW (ref 39.0–52.0)
Hemoglobin: 12.7 g/dL — ABNORMAL LOW (ref 13.0–17.0)
Immature Granulocytes: 0 %
Lymphocytes Relative: 31 %
Lymphs Abs: 1.4 10*3/uL (ref 0.7–4.0)
MCH: 27.3 pg (ref 26.0–34.0)
MCHC: 32.6 g/dL (ref 30.0–36.0)
MCV: 83.7 fL (ref 80.0–100.0)
Monocytes Absolute: 0.5 10*3/uL (ref 0.1–1.0)
Monocytes Relative: 10 %
Neutro Abs: 2.7 10*3/uL (ref 1.7–7.7)
Neutrophils Relative %: 55 %
Platelets: 215 10*3/uL (ref 150–400)
RBC: 4.65 MIL/uL (ref 4.22–5.81)
RDW: 15.3 % (ref 11.5–15.5)
WBC: 4.7 10*3/uL (ref 4.0–10.5)
nRBC: 0 % (ref 0.0–0.2)

## 2020-09-06 LAB — COMPREHENSIVE METABOLIC PANEL
ALT: 18 U/L (ref 0–44)
AST: 19 U/L (ref 15–41)
Albumin: 3.8 g/dL (ref 3.5–5.0)
Alkaline Phosphatase: 90 U/L (ref 38–126)
Anion gap: 8 (ref 5–15)
BUN: 22 mg/dL (ref 8–23)
CO2: 27 mmol/L (ref 22–32)
Calcium: 9.1 mg/dL (ref 8.9–10.3)
Chloride: 102 mmol/L (ref 98–111)
Creatinine, Ser: 0.84 mg/dL (ref 0.61–1.24)
GFR calc non Af Amer: >60 mL/min (ref >60)
Glucose, Bld: 108 mg/dL — ABNORMAL HIGH (ref 70–99)
Potassium: 4.3 mmol/L (ref 3.5–5.1)
Sodium: 137 mmol/L (ref 135–145)
Total Bilirubin: 0.8 mg/dL (ref 0.3–1.2)
Total Protein: 7.4 g/dL (ref 6.5–8.1)

## 2020-09-06 MED ORDER — PROCHLORPERAZINE MALEATE 10 MG PO TABS
10.0000 mg | ORAL_TABLET | Freq: Once | ORAL | Status: AC
  Administered 2020-09-06: 10 mg via ORAL
  Filled 2020-09-06: qty 1

## 2020-09-06 MED ORDER — GEMCITABINE CHEMO FOR BLADDER INSTILLATION 2000 MG
2000.0000 mg | Freq: Once | INTRAVENOUS | Status: AC
  Administered 2020-09-06: 2000 mg via INTRAVESICAL
  Filled 2020-09-06: qty 52.6

## 2020-09-06 NOTE — Progress Notes (Signed)
Simple Catheter Placement  Due to bladder cancer treatment patient is present today for a foley cath placement.  Patient was cleaned and prepped in a sterile fashion with betadine and 1/2 tube of 2% lidocaine jelly was instilled into the urethra. An 18FR foley catheter was inserted, urine return was noted  72ml, urine was yellow in color.  The balloon was filled with 10cc of sterile water and plugged. A urine sample was obtained from the catheter and collected in a sterile specimen cup. The urine sample was provided to the patient to take with him to the cancer center for testing prior to treatment. Cancer center to remove catheter following bladder instillation today. Patient tolerated well, no complications were noted   Performed by: Debroah Loop, PA-C

## 2020-09-06 NOTE — Telephone Encounter (Signed)
Patient goes to urology office, Dr. Bernardo Heater, for catheter placement prior to the chemotherapy appointment.  A urine specimen is collected at urology office.  Patient will bring the urine sample here for UA test prior to chemotherapy.  Celene Kras., PA at urology office and Dr. Tasia Catchings agree with this plan.  Also have notified [facility] that patient will be bringing outside urine specimen for UA.

## 2020-09-06 NOTE — Progress Notes (Signed)
Patient reports dysuria.

## 2020-09-06 NOTE — Progress Notes (Signed)
Per Dr Tasia Catchings- ok to treat today with UA results . Pt arrived in infusion with foley catheter already . Attached to bag and drained 350 ml. Pt states took anti-spasm [place] ) med prescribed by urologist at home before coming . Gave Gemzar as ordered. Clamped catheter. Pt was able to hold for 60 mins without incident. Unclamped and Drained. Deflated balloon and removed foley catheter. Pt tolerated without incident. Pt discharged stable .

## 2020-09-06 NOTE — Progress Notes (Signed)
Hematology/Oncology  Follow up note [facility] [phone] [phone]   Patient Care Team: Idelle Crouch, MD as PCP - General (Internal Medicine)  REFERRING PROVIDER: Idelle Crouch, MD  CHIEF COMPLAINTS/REASON FOR VISIT:  Follow up for intravesical chemotherapy  HISTORY OF PRESENTING ILLNESS:   Darren Wright is a  86 y.o.  male with PMH including squamous cell carcinoma of the skin, hypertension, GERD, basal cell carcinoma, A. fib on anticoagulation, newly diagnosed nonmuscle invasive bladder cancer was seen in consultation at the request of  Idelle Crouch, MD  for evaluation of intravesical chemotherapy. Patient had CT scan done in January for evaluation of low abdominal pain and was diagnosed with diverticulitis.  Symptoms improved after antibiotics course. There was incidental finding of indeterminate soft tissue thickening at the right base of bladder.  As well as enlarged and heterogeneously enhancing prostate.  Patient denies any hematuria, difficulty urination. Patient was referred to see Dr. Bernardo Heater and patient had cystoscopy on 07/17/2020.  Intraoperative findings showed bladder tumor, involving the right posterior wall, right lateral wall and the bladder base extending to the bladder neck.  The posterior anterior dimension was 6 cm in the lateral medial dimension 5 cm.  Patient underwent TURBT for resection of the tumor. Pathology showed noninvasive papillary urothelial carcinoma, low-grade, muscularis propria present and uninvolved.   Given the large size of bladder tumor, Dr. Bernardo Heater recommend intravesical chemo Patient was referred to establish care with oncology for intravesical chemotherapy.  Patient reports feeling well today.  He has some hematuria after the surgery.  Pradaxa was resumed after surgery.  Today he reports that his urine color was normal this morning.   INTERVAL HISTORY Darren Wright is a 86 y.o. male  who has above history reviewed by me today presents for follow up visit for Intravesical gemcitabine treatments for bladder cancer  Problems and complaints are listed below:      Recent UTI with  E coli faecalis, on antibiotic course with Macrobid.  He report dysuria is better but not completely resolved. He denies any fever, abdominal pain, or flank pain.  He takes [place] for overreactive bladder symptoms.   Review of Systems  Constitutional: Negative for appetite change, chills, fatigue, fever and unexpected weight change.  HENT:   Negative for hearing loss and voice change.   Eyes: Negative for eye problems and icterus.  Respiratory: Negative for chest tightness, cough and shortness of breath.   Cardiovascular: Negative for chest pain and leg swelling.  Gastrointestinal: Negative for abdominal distention and abdominal pain.  Endocrine: Negative for hot flashes.  Genitourinary: Negative for difficulty urinating, dysuria and frequency.   Musculoskeletal: Negative for arthralgias.  Skin: Negative for itching and rash.  Neurological: Negative for light-headedness and numbness.  Hematological: Negative for adenopathy. Does not bruise/bleed easily.  Psychiatric/Behavioral: Negative for confusion.    MEDICAL HISTORY:  Past Medical History:  Diagnosis Date   A-fib ([place])    Basal cell carcinoma 12/11/2016   left medial cheek   GERD (gastroesophageal reflux disease)    Hypertension    Squamous cell carcinoma of skin 03/10/2018   left crown scalp    SURGICAL HISTORY: Past Surgical History:  Procedure Laterality Date   CHOLECYSTECTOMY     CYSTOSCOPY W/ RETROGRADES Bilateral 07/17/2020   Procedure: CYSTOSCOPY WITH RETROGRADE PYELOGRAM;  Surgeon: Abbie Sons, MD;  Location: [facility] ORS;  Service: Urology;  Laterality: Bilateral;   TONSILLECTOMY     TRANSURETHRAL RESECTION OF  BLADDER TUMOR WITH MITOMYCIN-C N/A 07/17/2020   Procedure: TRANSURETHRAL RESECTION OF BLADDER TUMOR  WITH gemcitabine;  Surgeon: Abbie Sons, MD;  Location: [facility] ORS;  Service: Urology;  Laterality: N/A;    SOCIAL HISTORY: Social History   Socioeconomic History   Marital status: Married    Spouse name: Not on file   Number of children: Not on file   Years of education: Not on file   Highest education level: Not on file  Occupational History   Not on file  Tobacco Use   Smoking status: Former Smoker    Types: Cigarettes    Quit date: 1985    Years since quitting: 36.7   Smokeless tobacco: Never Used  Substance and Sexual Activity   Alcohol use: Not Currently   Drug use: Never   Sexual activity: Not on file  Other Topics Concern   Not on file  Social History Narrative   Not on file   Social Determinants of Health   Financial Resource Strain:    Difficulty of Paying Living Expenses: Not on file  Food Insecurity:    Worried About Charity fundraiser in the Last Year: Not on file   [employer] of Food in the Last Year: Not on file  Transportation Needs:    Lack of Transportation (Medical): Not on file   Lack of Transportation (Non-Medical): Not on file  Physical Activity:    Days of Exercise per Week: Not on file   Minutes of Exercise per Session: Not on file  Stress:    Feeling of Stress : Not on file  Social Connections:    Frequency of Communication with Friends and Family: Not on file   Frequency of Social Gatherings with Friends and Family: Not on file   Attends Religious Services: Not on file   Active Member of Clubs or Organizations: Not on file   Attends Archivist Meetings: Not on file   Marital Status: Not on file  Intimate Partner Violence:    Fear of Current or Ex-Partner: Not on file   Emotionally Abused: Not on file   Physically Abused: Not on file   Sexually Abused: Not on file    FAMILY HISTORY: Family History  Problem Relation Age of Onset   Pancreatic cancer Mother    Prostate cancer Father     Breast cancer Sister    Bladder Cancer Sister     ALLERGIES:  is allergic to ibuprofen and penicillins.  MEDICATIONS:  Current Outpatient Medications  Medication Sig Dispense Refill   benazepril-hydrochlorthiazide (LOTENSIN HCT) 20-12.5 MG tablet Take 1 tablet by mouth daily.      cyanocobalamin 1000 MCG tablet Take 1,000 mcg by mouth daily.      dabigatran (PRADAXA) 150 MG CAPS capsule Take 150 mg by mouth 2 (two) times daily.      Multiple Vitamins-Minerals (PRESERVISION AREDS 2 PO) Take 1 capsule by mouth in the morning and at bedtime.      nitrofurantoin, macrocrystal-monohydrate, (MACROBID) 100 MG capsule Take 1 capsule (100 mg total) by mouth 2 (two) times daily. 14 capsule 0   pantoprazole (PROTONIX) 40 MG tablet Take 40 mg by mouth daily.      sildenafil (REVATIO) 20 MG tablet 3-5 tablets daily as needed     tamsulosin (FLOMAX) 0.4 MG CAPS capsule Take 0.4 mg by mouth at bedtime.      timolol (TIMOPTIC) 0.5 % ophthalmic solution Place 1 drop into the right eye daily.  Vibegron (GEMTESA) 75 MG TABS Take 75 mg by mouth daily. 28 tablet 0   No current facility-administered medications for this visit.     PHYSICAL EXAMINATION: ECOG PERFORMANCE STATUS: 1 - Symptomatic but completely ambulatory Vitals:   09/06/20 0936  BP: (!) 154/83  Pulse: 64  Resp: 18  Temp: (!) 97.2 F (36.2 C)   Filed Weights   09/06/20 0936  Weight: 163 lb 6.4 oz (74.1 kg)    Physical Exam Constitutional:      General: He is not in acute distress. HENT:     Head: Normocephalic and atraumatic.  Eyes:     General: No scleral icterus. Cardiovascular:     Rate and Rhythm: Normal rate and regular rhythm.     Heart sounds: Normal heart sounds.  Pulmonary:     Effort: Pulmonary effort is normal. No respiratory distress.     Breath sounds: No wheezing.  Abdominal:     General: Bowel sounds are normal. There is no distension.     Palpations: Abdomen is soft.  Musculoskeletal:         General: No deformity. Normal range of motion.     Cervical back: Normal range of motion and neck supple.  Skin:    General: Skin is warm and dry.     Findings: No erythema or rash.  Neurological:     Mental Status: He is alert and oriented to person, place, and time. Mental status is at baseline.     Cranial Nerves: No cranial nerve deficit.     Coordination: Coordination normal.  Psychiatric:        Mood and Affect: Mood normal.     LABORATORY DATA:  I have reviewed the data as listed Lab Results  Component Value Date   WBC 4.7 09/06/2020   HGB 12.7 (L) 09/06/2020   HCT 38.9 (L) 09/06/2020   MCV 83.7 09/06/2020   PLT 215 09/06/2020   Recent Labs    06/10/20 1902 06/10/20 1902 08/20/20 0813 08/30/20 0901 09/06/20 0844  NA 133*   < > 137 140 137  K 3.6   < > 4.2 4.5 4.3  CL 99   < > 102 104 102  CO2 24   < > 28 29 27   GLUCOSE 147*   < > 109* 109* 108*  BUN 21   < > 16 19 22   CREATININE 1.06   < > 0.90 0.94 0.84  CALCIUM 8.7*   < > 8.9 9.2 9.1  GFRNONAA >60   < > >60 >60 >60  GFRAA >60  --  >60 >60  --   PROT 7.0   < > 7.0 7.1 7.4  ALBUMIN 3.5   < > 3.7 3.6 3.8  AST 21   < > 22 21 19   ALT 20   < > 21 20 18   ALKPHOS 81   < > 72 85 90  BILITOT 1.1   < > 0.7 0.8 0.8   < > = values in this interval not displayed.   Iron/TIBC/Ferritin/ %Sat No results found for: IRON, TIBC, FERRITIN, IRONPCTSAT    RADIOGRAPHIC STUDIES: I have personally reviewed the radiological images as listed and agreed with the findings in the report. No results found.    ASSESSMENT & PLAN:  1. Urothelial carcinoma of bladder ([place])   2. Overactive bladder   3. Encounter for antineoplastic chemotherapy    Bladder urothelial carcinoma tumor size is more than 5 cm, intermediate risk group Intravesical therapy  is suggested following TURBT.  Recommend 6 weekly treatment of gemcitabine  Labs are reviewed and discussed with patient. Proceed with intravesical gemcitabine today.   # UTI,  today's UA is negative for nitrate and leukocyte esterase. Finish course of Macrobid #Dysuria-overreactive bladder Continue Gemtesa  . Supportive care measures are necessary for patient well-being and will be provided as necessary. We spent sufficient time to discuss many aspect of care, questions were answered to patient's satisfaction.   Orders Placed This Encounter  Procedures   Urinalysis, Complete w Microscopic    Standing Status:   Standing    Number of Occurrences:   5    Standing Expiration Date:   09/06/2021    All questions were answered. The patient knows to call the clinic with any problems questions or concerns.  cc Idelle Crouch, MD    Return of visit: 1 week   Earlie Server, MD, PhD Hematology Oncology [facility] at [facility] Pager- [phone] 09/06/2020

## 2020-09-10 ENCOUNTER — Ambulatory Visit: Payer: PPO | Admitting: Oncology

## 2020-09-10 ENCOUNTER — Ambulatory Visit: Payer: PPO

## 2020-09-10 ENCOUNTER — Other Ambulatory Visit: Payer: PPO

## 2020-09-12 NOTE — Progress Notes (Signed)
Simple Catheter Placement  Due to urinary retention patient is present today for a foley cath placement for bladder instillation.  Patient was cleaned and prepped in a sterile fashion with betadine. An 18 FR foley catheter was inserted, urine return was noted  150 ml, urine was yellow in color.  The balloon was filled with 10cc of sterile water.  A catheter plug was placed.  Cather will be removed after instillation by cancer center.  Patient tolerated well, no complications were noted   Performed by: Zara Council, PA-C   Additional notes/ Follow up: TBD

## 2020-09-13 ENCOUNTER — Encounter: Payer: Self-pay | Admitting: Oncology

## 2020-09-13 ENCOUNTER — Ambulatory Visit: Payer: PPO

## 2020-09-13 ENCOUNTER — Ambulatory Visit (INDEPENDENT_AMBULATORY_CARE_PROVIDER_SITE_OTHER): Payer: PPO | Admitting: Urology

## 2020-09-13 ENCOUNTER — Encounter: Payer: Self-pay | Admitting: Urology

## 2020-09-13 ENCOUNTER — Inpatient Hospital Stay (HOSPITAL_BASED_OUTPATIENT_CLINIC_OR_DEPARTMENT_OTHER): Payer: PPO | Admitting: Oncology

## 2020-09-13 ENCOUNTER — Ambulatory Visit: Payer: Self-pay | Admitting: Urology

## 2020-09-13 ENCOUNTER — Other Ambulatory Visit: Payer: PPO

## 2020-09-13 ENCOUNTER — Inpatient Hospital Stay: Payer: PPO

## 2020-09-13 ENCOUNTER — Other Ambulatory Visit: Payer: Self-pay

## 2020-09-13 ENCOUNTER — Ambulatory Visit: Payer: PPO | Admitting: Oncology

## 2020-09-13 VITALS — BP 152/77 | HR 68 | Temp 98.4°F | Resp 18 | Wt 164.5 lb

## 2020-09-13 DIAGNOSIS — C679 Malignant neoplasm of bladder, unspecified: Secondary | ICD-10-CM

## 2020-09-13 DIAGNOSIS — Z5111 Encounter for antineoplastic chemotherapy: Secondary | ICD-10-CM

## 2020-09-13 DIAGNOSIS — N3281 Overactive bladder: Secondary | ICD-10-CM

## 2020-09-13 LAB — COMPREHENSIVE METABOLIC PANEL
ALT: 23 U/L (ref 0–44)
AST: 22 U/L (ref 15–41)
Albumin: 3.8 g/dL (ref 3.5–5.0)
Alkaline Phosphatase: 77 U/L (ref 38–126)
Anion gap: 9 (ref 5–15)
BUN: 20 mg/dL (ref 8–23)
CO2: 26 mmol/L (ref 22–32)
Calcium: 8.9 mg/dL (ref 8.9–10.3)
Chloride: 102 mmol/L (ref 98–111)
Creatinine, Ser: 0.93 mg/dL (ref 0.61–1.24)
GFR, Estimated: >60 mL/min (ref >60)
Glucose, Bld: 104 mg/dL — ABNORMAL HIGH (ref 70–99)
Potassium: 4 mmol/L (ref 3.5–5.1)
Sodium: 137 mmol/L (ref 135–145)
Total Bilirubin: 0.7 mg/dL (ref 0.3–1.2)
Total Protein: 7.1 g/dL (ref 6.5–8.1)

## 2020-09-13 LAB — CBC WITH DIFFERENTIAL/PLATELET
Abs Immature Granulocytes: 0.01 10*3/uL (ref 0.00–0.07)
Basophils Absolute: 0 10*3/uL (ref 0.0–0.1)
Basophils Relative: 1 %
Eosinophils Absolute: 0.1 10*3/uL (ref 0.0–0.5)
Eosinophils Relative: 2 %
HCT: 38.8 % — ABNORMAL LOW (ref 39.0–52.0)
Hemoglobin: 12.6 g/dL — ABNORMAL LOW (ref 13.0–17.0)
Immature Granulocytes: 0 %
Lymphocytes Relative: 31 %
Lymphs Abs: 1.4 10*3/uL (ref 0.7–4.0)
MCH: 27.5 pg (ref 26.0–34.0)
MCHC: 32.5 g/dL (ref 30.0–36.0)
MCV: 84.7 fL (ref 80.0–100.0)
Monocytes Absolute: 0.5 10*3/uL (ref 0.1–1.0)
Monocytes Relative: 11 %
Neutro Abs: 2.5 10*3/uL (ref 1.7–7.7)
Neutrophils Relative %: 55 %
Platelets: 246 10*3/uL (ref 150–400)
RBC: 4.58 MIL/uL (ref 4.22–5.81)
RDW: 15.8 % — ABNORMAL HIGH (ref 11.5–15.5)
WBC: 4.6 10*3/uL (ref 4.0–10.5)
nRBC: 0 % (ref 0.0–0.2)

## 2020-09-13 LAB — URINALYSIS, COMPLETE (UACMP) WITH MICROSCOPIC
Bacteria, UA: NONE SEEN
Bilirubin Urine: NEGATIVE
Glucose, UA: NEGATIVE mg/dL
Ketones, ur: NEGATIVE mg/dL
Leukocytes,Ua: NEGATIVE
Nitrite: NEGATIVE
Protein, ur: NEGATIVE mg/dL
Specific Gravity, Urine: 1.008 (ref 1.005–1.030)
Squamous Epithelial / HPF: NONE SEEN (ref 0–5)
pH: 6 (ref 5.0–8.0)

## 2020-09-13 MED ORDER — GEMCITABINE CHEMO FOR BLADDER INSTILLATION 2000 MG
2000.0000 mg | Freq: Once | INTRAVENOUS | Status: AC
  Administered 2020-09-13: 2000 mg via INTRAVESICAL
  Filled 2020-09-13: qty 52.6

## 2020-09-13 MED ORDER — PROCHLORPERAZINE MALEATE 10 MG PO TABS
10.0000 mg | ORAL_TABLET | Freq: Once | ORAL | Status: AC
  Administered 2020-09-13: 10 mg via ORAL
  Filled 2020-09-13: qty 1

## 2020-09-13 NOTE — Progress Notes (Signed)
Per Dr. Tasia Catchings okay to proceed with treatment with 09/13/20 UA results.  Per Pt, pt took Gemtesa at home prior to reporting to clinic.   1158: Pt states he is feeling some pressure, and foley unclamped per pt request. Pt able to tolerate treatment for 59 minutes. No distress noted, and pt denies pain.   1220: Foley removed and pt discharged. Pt tolerated treatment well with no distress noted. Pt stable at discharge.

## 2020-09-13 NOTE — Progress Notes (Signed)
Hematology/Oncology  Follow up note [facility] [phone] Fax:(336) [phone]   Patient Care Team: Idelle Crouch, MD as PCP - General (Internal Medicine)  REFERRING PROVIDER: Idelle Crouch, MD  CHIEF COMPLAINTS/REASON FOR VISIT:  Follow up for intravesical chemotherapy  HISTORY OF PRESENTING ILLNESS:   Darren Wright is a  86 y.o.  male with PMH including squamous cell carcinoma of the skin, hypertension, GERD, basal cell carcinoma, A. fib on anticoagulation, newly diagnosed nonmuscle invasive bladder cancer was seen in consultation at the request of  Idelle Crouch, MD  for evaluation of intravesical chemotherapy. Patient had CT scan done in January for evaluation of low abdominal pain and was diagnosed with diverticulitis.  Symptoms improved after antibiotics course. There was incidental finding of indeterminate soft tissue thickening at the right base of bladder.  As well as enlarged and heterogeneously enhancing prostate.  Patient denies any hematuria, difficulty urination. Patient was referred to see Dr. Bernardo Heater and patient had cystoscopy on 07/17/2020.  Intraoperative findings showed bladder tumor, involving the right posterior wall, right lateral wall and the bladder base extending to the bladder neck.  The posterior anterior dimension was 6 cm in the lateral medial dimension 5 cm.  Patient underwent TURBT for resection of the tumor. Pathology showed noninvasive papillary urothelial carcinoma, low-grade, muscularis propria present and uninvolved.   Given the large size of bladder tumor, Dr. Bernardo Heater recommend intravesical chemo Patient was referred to establish care with oncology for intravesical chemotherapy.  # UTI with  E coli faecalis, finished antibiotic course with Macrobid.    INTERVAL HISTORY JORDI LACKO is a 86 y.o. male who has above history reviewed by me today presents for follow up visit for Intravesical gemcitabine  treatments for bladder cancer  Problems and complaints are listed below:      Patient reports doing well.  Denies any fever, chills, abdominal pain, flank pain. He takes [place] for overreactive bladder symptoms.   Review of Systems  Constitutional: Negative for appetite change, chills, fatigue, fever and unexpected weight change.  HENT:   Negative for hearing loss and voice change.   Eyes: Negative for eye problems and icterus.  Respiratory: Negative for chest tightness, cough and shortness of breath.   Cardiovascular: Negative for chest pain and leg swelling.  Gastrointestinal: Negative for abdominal distention and abdominal pain.  Endocrine: Negative for hot flashes.  Genitourinary: Negative for difficulty urinating, dysuria and frequency.   Musculoskeletal: Negative for arthralgias.  Skin: Negative for itching and rash.  Neurological: Negative for light-headedness and numbness.  Hematological: Negative for adenopathy. Does not bruise/bleed easily.  Psychiatric/Behavioral: Negative for confusion.    MEDICAL HISTORY:  Past Medical History:  Diagnosis Date  . A-fib ([place])   . Basal cell carcinoma 12/11/2016   left medial cheek  . GERD (gastroesophageal reflux disease)   . Hypertension   . Squamous cell carcinoma of skin 03/10/2018   left crown scalp    SURGICAL HISTORY: Past Surgical History:  Procedure Laterality Date  . CHOLECYSTECTOMY    . CYSTOSCOPY W/ RETROGRADES Bilateral 07/17/2020   Procedure: CYSTOSCOPY WITH RETROGRADE PYELOGRAM;  Surgeon: Abbie Sons, MD;  Location: [facility] ORS;  Service: Urology;  Laterality: Bilateral;  . TONSILLECTOMY    . TRANSURETHRAL RESECTION OF BLADDER TUMOR WITH MITOMYCIN-C N/A 07/17/2020   Procedure: TRANSURETHRAL RESECTION OF BLADDER TUMOR WITH gemcitabine;  Surgeon: Abbie Sons, MD;  Location: [facility] ORS;  Service: Urology;  Laterality: N/A;    SOCIAL HISTORY:  Social History   Socioeconomic History  . Marital status: Married     Spouse name: Not on file  . Number of children: Not on file  . Years of education: Not on file  . Highest education level: Not on file  Occupational History  . Not on file  Tobacco Use  . Smoking status: Former Smoker    Types: Cigarettes    Quit date: 1985    Years since quitting: 36.8  . Smokeless tobacco: Never Used  Substance and Sexual Activity  . Alcohol use: Not Currently  . Drug use: Never  . Sexual activity: Not on file  Other Topics Concern  . Not on file  Social History Narrative  . Not on file   Social Determinants of Health   Financial Resource Strain:   . Difficulty of Paying Living Expenses: Not on file  Food Insecurity:   . Worried About Charity fundraiser in the Last Year: Not on file  . Ran Out of Food in the Last Year: Not on file  Transportation Needs:   . Lack of Transportation (Medical): Not on file  . Lack of Transportation (Non-Medical): Not on file  Physical Activity:   . Days of Exercise per Week: Not on file  . Minutes of Exercise per Session: Not on file  Stress:   . Feeling of Stress : Not on file  Social Connections:   . Frequency of Communication with Friends and Family: Not on file  . Frequency of Social Gatherings with Friends and Family: Not on file  . Attends Religious Services: Not on file  . Active Member of Clubs or Organizations: Not on file  . Attends Archivist Meetings: Not on file  . Marital Status: Not on file  Intimate Partner Violence:   . Fear of Current or Ex-Partner: Not on file  . Emotionally Abused: Not on file  . Physically Abused: Not on file  . Sexually Abused: Not on file    FAMILY HISTORY: Family History  Problem Relation Age of Onset  . Pancreatic cancer Mother   . Prostate cancer Father   . Breast cancer Sister   . Bladder Cancer Sister     ALLERGIES:  is allergic to ibuprofen and penicillins.  MEDICATIONS:  Current Outpatient Medications  Medication Sig Dispense Refill  .  benazepril-hydrochlorthiazide (LOTENSIN HCT) 20-12.5 MG tablet Take 1 tablet by mouth daily.     . cyanocobalamin 1000 MCG tablet Take 1,000 mcg by mouth daily.     . dabigatran (PRADAXA) 150 MG CAPS capsule Take 150 mg by mouth 2 (two) times daily.     . Multiple Vitamins-Minerals (PRESERVISION AREDS 2 PO) Take 1 capsule by mouth in the morning and at bedtime.     . pantoprazole (PROTONIX) 40 MG tablet Take 40 mg by mouth daily.     . sildenafil (REVATIO) 20 MG tablet 3-5 tablets daily as needed    . tamsulosin (FLOMAX) 0.4 MG CAPS capsule Take 0.4 mg by mouth at bedtime.     . timolol (TIMOPTIC) 0.5 % ophthalmic solution Place 1 drop into the right eye daily.    . Vibegron (GEMTESA) 75 MG TABS Take 75 mg by mouth daily. 28 tablet 0  . nitrofurantoin, macrocrystal-monohydrate, (MACROBID) 100 MG capsule Take 1 capsule (100 mg total) by mouth 2 (two) times daily. (Patient not taking: Reported on 09/13/2020) 14 capsule 0   No current facility-administered medications for this visit.     PHYSICAL EXAMINATION: ECOG  PERFORMANCE STATUS: 1 - Symptomatic but completely ambulatory Vitals:   09/13/20 0956  BP: (!) 152/77  Pulse: 68  Resp: 18  Temp: 98.4 F (36.9 C)   Filed Weights   09/13/20 0956  Weight: 164 lb 8 oz (74.6 kg)    Physical Exam Constitutional:      General: He is not in acute distress. HENT:     Head: Normocephalic and atraumatic.  Eyes:     General: No scleral icterus. Cardiovascular:     Rate and Rhythm: Normal rate and regular rhythm.     Heart sounds: Normal heart sounds.  Pulmonary:     Effort: Pulmonary effort is normal. No respiratory distress.     Breath sounds: No wheezing.  Abdominal:     General: Bowel sounds are normal. There is no distension.     Palpations: Abdomen is soft.  Musculoskeletal:        General: No deformity. Normal range of motion.     Cervical back: Normal range of motion and neck supple.  Skin:    General: Skin is warm and dry.      Findings: No erythema or rash.  Neurological:     Mental Status: He is alert and oriented to person, place, and time. Mental status is at baseline.     Cranial Nerves: No cranial nerve deficit.     Coordination: Coordination normal.  Psychiatric:        Mood and Affect: Mood normal.     LABORATORY DATA:  I have reviewed the data as listed Lab Results  Component Value Date   WBC 4.6 09/13/2020   HGB 12.6 (L) 09/13/2020   HCT 38.8 (L) 09/13/2020   MCV 84.7 09/13/2020   PLT 246 09/13/2020   Recent Labs    06/10/20 1902 06/10/20 1902 08/20/20 0813 08/20/20 0813 08/30/20 0901 09/06/20 0844 09/13/20 0941  NA 133*   < > 137   < > 140 137 137  K 3.6   < > 4.2   < > 4.5 4.3 4.0  CL 99   < > 102   < > 104 102 102  CO2 24   < > 28   < > 29 27 26   GLUCOSE 147*   < > 109*   < > 109* 108* 104*  BUN 21   < > 16   < > 19 22 20   CREATININE 1.06   < > 0.90   < > 0.94 0.84 0.93  CALCIUM 8.7*   < > 8.9   < > 9.2 9.1 8.9  GFRNONAA >60   < > >60   < > >60 >60 >60  GFRAA >60  --  >60  --  >60  --   --   PROT 7.0   < > 7.0   < > 7.1 7.4 7.1  ALBUMIN 3.5   < > 3.7   < > 3.6 3.8 3.8  AST 21   < > 22   < > 21 19 22   ALT 20   < > 21   < > 20 18 23   ALKPHOS 81   < > 72   < > 85 90 77  BILITOT 1.1   < > 0.7   < > 0.8 0.8 0.7   < > = values in this interval not displayed.   Iron/TIBC/Ferritin/ %Sat No results found for: IRON, TIBC, FERRITIN, IRONPCTSAT    RADIOGRAPHIC STUDIES: I have personally reviewed the radiological images as listed  and agreed with the findings in the report. No results found.    ASSESSMENT & PLAN:  1. Urothelial carcinoma of bladder ([place])   2. Overactive bladder   3. Encounter for antineoplastic chemotherapy    Bladder urothelial carcinoma tumor size is more than 5 cm, intermediate risk group Intravesical therapy is suggested following TURBT.  Recommend 6 weekly treatment of gemcitabine  Labs are reviewed and discussed with patient Count acceptable to proceed  with intravesical gemcitabine today.  Plan total 6 cycles.   # UTI he has finished antibiotics.  today's UA is negative for nitrate and leukocyte esterase. #Dysuria-overreactive bladder Continue Gemtesa  . Supportive care measures are necessary for patient well-being and will be provided as necessary. We spent sufficient time to discuss many aspect of care, questions were answered to patient's satisfaction.   All questions were answered. The patient knows to call the clinic with any problems questions or concerns. Return of visit: 1 week   Earlie Server, MD, PhD Hematology Oncology [facility] Pager- [phone] 09/13/2020

## 2020-09-13 NOTE — Progress Notes (Signed)
Patient denies new problems/concerns today.   °

## 2020-09-13 NOTE — Addendum Note (Signed)
Addended by: Alvera Novel on: 09/13/2020 04:08 PM   Modules accepted: Orders

## 2020-09-20 ENCOUNTER — Encounter: Payer: Self-pay | Admitting: Oncology

## 2020-09-20 ENCOUNTER — Inpatient Hospital Stay: Payer: PPO

## 2020-09-20 ENCOUNTER — Encounter: Payer: Self-pay | Admitting: Urology

## 2020-09-20 ENCOUNTER — Other Ambulatory Visit: Payer: Self-pay

## 2020-09-20 ENCOUNTER — Ambulatory Visit: Payer: PPO | Admitting: Urology

## 2020-09-20 ENCOUNTER — Inpatient Hospital Stay (HOSPITAL_BASED_OUTPATIENT_CLINIC_OR_DEPARTMENT_OTHER): Payer: PPO | Admitting: Oncology

## 2020-09-20 VITALS — BP 159/86 | HR 76 | Ht 71.0 in | Wt 164.0 lb

## 2020-09-20 VITALS — BP 126/61 | HR 53 | Temp 97.3°F | Resp 18 | Wt 164.6 lb

## 2020-09-20 DIAGNOSIS — C679 Malignant neoplasm of bladder, unspecified: Secondary | ICD-10-CM

## 2020-09-20 DIAGNOSIS — N3281 Overactive bladder: Secondary | ICD-10-CM | POA: Diagnosis not present

## 2020-09-20 DIAGNOSIS — Z5111 Encounter for antineoplastic chemotherapy: Secondary | ICD-10-CM | POA: Diagnosis not present

## 2020-09-20 LAB — CBC WITH DIFFERENTIAL/PLATELET
Abs Immature Granulocytes: 0.01 10*3/uL (ref 0.00–0.07)
Basophils Absolute: 0 10*3/uL (ref 0.0–0.1)
Basophils Relative: 1 %
Eosinophils Absolute: 0.1 10*3/uL (ref 0.0–0.5)
Eosinophils Relative: 2 %
HCT: 39.4 % (ref 39.0–52.0)
Hemoglobin: 12.7 g/dL — ABNORMAL LOW (ref 13.0–17.0)
Immature Granulocytes: 0 %
Lymphocytes Relative: 25 %
Lymphs Abs: 0.9 10*3/uL (ref 0.7–4.0)
MCH: 27.4 pg (ref 26.0–34.0)
MCHC: 32.2 g/dL (ref 30.0–36.0)
MCV: 85.1 fL (ref 80.0–100.0)
Monocytes Absolute: 0.5 10*3/uL (ref 0.1–1.0)
Monocytes Relative: 14 %
Neutro Abs: 2.2 10*3/uL (ref 1.7–7.7)
Neutrophils Relative %: 58 %
Platelets: 187 10*3/uL (ref 150–400)
RBC: 4.63 MIL/uL (ref 4.22–5.81)
RDW: 16 % — ABNORMAL HIGH (ref 11.5–15.5)
WBC: 3.7 10*3/uL — ABNORMAL LOW (ref 4.0–10.5)
nRBC: 0 % (ref 0.0–0.2)

## 2020-09-20 LAB — URINALYSIS, COMPLETE (UACMP) WITH MICROSCOPIC
Bacteria, UA: NONE SEEN
Bilirubin Urine: NEGATIVE
Glucose, UA: NEGATIVE mg/dL
Ketones, ur: NEGATIVE mg/dL
Nitrite: NEGATIVE
Protein, ur: NEGATIVE mg/dL
RBC / HPF: >50 RBC/hpf — ABNORMAL HIGH (ref 0–5)
Specific Gravity, Urine: 1.012 (ref 1.005–1.030)
pH: 5 (ref 5.0–8.0)

## 2020-09-20 LAB — COMPREHENSIVE METABOLIC PANEL
ALT: 20 U/L (ref 0–44)
AST: 21 U/L (ref 15–41)
Albumin: 3.7 g/dL (ref 3.5–5.0)
Alkaline Phosphatase: 81 U/L (ref 38–126)
Anion gap: 8 (ref 5–15)
BUN: 20 mg/dL (ref 8–23)
CO2: 27 mmol/L (ref 22–32)
Calcium: 9 mg/dL (ref 8.9–10.3)
Chloride: 104 mmol/L (ref 98–111)
Creatinine, Ser: 0.84 mg/dL (ref 0.61–1.24)
GFR, Estimated: >60 mL/min (ref >60)
Glucose, Bld: 103 mg/dL — ABNORMAL HIGH (ref 70–99)
Potassium: 4.1 mmol/L (ref 3.5–5.1)
Sodium: 139 mmol/L (ref 135–145)
Total Bilirubin: 0.7 mg/dL (ref 0.3–1.2)
Total Protein: 6.9 g/dL (ref 6.5–8.1)

## 2020-09-20 MED ORDER — GEMCITABINE CHEMO FOR BLADDER INSTILLATION 2000 MG
2000.0000 mg | Freq: Once | INTRAVENOUS | Status: AC
  Administered 2020-09-20: 2000 mg via INTRAVESICAL
  Filled 2020-09-20: qty 52.6

## 2020-09-20 MED ORDER — PROCHLORPERAZINE MALEATE 10 MG PO TABS
10.0000 mg | ORAL_TABLET | Freq: Once | ORAL | Status: AC
  Administered 2020-09-20: 10 mg via ORAL
  Filled 2020-09-20: qty 1

## 2020-09-20 NOTE — Progress Notes (Signed)
Hematology/Oncology  Follow up note [facility] Telephone:(336) [phone] Fax:([phone]   Patient Care Team: Idelle Crouch, MD as PCP - General (Internal Medicine)  REFERRING PROVIDER: Idelle Crouch, MD  CHIEF COMPLAINTS/REASON FOR VISIT:  Follow up for intravesical chemotherapy  HISTORY OF PRESENTING ILLNESS:   Darren Wright is a  86 y.o.  male with PMH including squamous cell carcinoma of the skin, hypertension, GERD, basal cell carcinoma, A. fib on anticoagulation, newly diagnosed nonmuscle invasive bladder cancer was seen in consultation at the request of  Idelle Crouch, MD  for evaluation of intravesical chemotherapy. Patient had CT scan done in January for evaluation of low abdominal pain and was diagnosed with diverticulitis.  Symptoms improved after antibiotics course. There was incidental finding of indeterminate soft tissue thickening at the right base of bladder.  As well as enlarged and heterogeneously enhancing prostate.  Patient denies any hematuria, difficulty urination. Patient was referred to see Dr. Bernardo Heater and patient had cystoscopy on 07/17/2020.  Intraoperative findings showed bladder tumor, involving the right posterior wall, right lateral wall and the bladder base extending to the bladder neck.  The posterior anterior dimension was 6 cm in the lateral medial dimension 5 cm.  Patient underwent TURBT for resection of the tumor. Pathology showed noninvasive papillary urothelial carcinoma, low-grade, muscularis propria present and uninvolved.   Given the large size of bladder tumor, Dr. Bernardo Heater recommend intravesical chemo Patient was referred to establish care with oncology for intravesical chemotherapy.  # UTI with  E coli faecalis, finished antibiotic course with Macrobid.    INTERVAL HISTORY Darren Wright is a 86 y.o. male who has above history reviewed by me today presents for follow up visit for Intravesical gemcitabine  treatments for bladder cancer  Problems and complaints are listed below:      Patient reports doing very well.  Denies any fever, chills, abdominal pain, flank pain, dysuria.He takes [place] for overreactive bladder symptoms.   Review of Systems  Constitutional: Negative for appetite change, chills, fatigue, fever and unexpected weight change.  HENT:   Negative for hearing loss and voice change.   Eyes: Negative for eye problems and icterus.  Respiratory: Negative for chest tightness, cough and shortness of breath.   Cardiovascular: Negative for chest pain and leg swelling.  Gastrointestinal: Negative for abdominal distention and abdominal pain.  Endocrine: Negative for hot flashes.  Genitourinary: Negative for difficulty urinating, dysuria and frequency.   Musculoskeletal: Negative for arthralgias.  Skin: Negative for itching and rash.  Neurological: Negative for light-headedness and numbness.  Hematological: Negative for adenopathy. Does not bruise/bleed easily.  Psychiatric/Behavioral: Negative for confusion.    MEDICAL HISTORY:  Past Medical History:  Diagnosis Date  . A-fib ([place])   . Basal cell carcinoma 12/11/2016   left medial cheek  . GERD (gastroesophageal reflux disease)   . Hypertension   . Squamous cell carcinoma of skin 03/10/2018   left crown scalp    SURGICAL HISTORY: Past Surgical History:  Procedure Laterality Date  . CHOLECYSTECTOMY    . CYSTOSCOPY W/ RETROGRADES Bilateral 07/17/2020   Procedure: CYSTOSCOPY WITH RETROGRADE PYELOGRAM;  Surgeon: Abbie Sons, MD;  Location: [facility] ORS;  Service: Urology;  Laterality: Bilateral;  . TONSILLECTOMY    . TRANSURETHRAL RESECTION OF BLADDER TUMOR WITH MITOMYCIN-C N/A 07/17/2020   Procedure: TRANSURETHRAL RESECTION OF BLADDER TUMOR WITH gemcitabine;  Surgeon: Abbie Sons, MD;  Location: [facility] ORS;  Service: Urology;  Laterality: N/A;    SOCIAL  HISTORY: Social History   Socioeconomic History  . Marital  status: Married    Spouse name: Not on file  . Number of children: Not on file  . Years of education: Not on file  . Highest education level: Not on file  Occupational History  . Not on file  Tobacco Use  . Smoking status: Former Smoker    Types: Cigarettes    Quit date: 1985    Years since quitting: 36.8  . Smokeless tobacco: Never Used  Substance and Sexual Activity  . Alcohol use: Not Currently  . Drug use: Never  . Sexual activity: Not Currently    Birth control/protection: None  Other Topics Concern  . Not on file  Social History Narrative  . Not on file   Social Determinants of Health   Financial Resource Strain:   . Difficulty of Paying Living Expenses: Not on file  Food Insecurity:   . Worried About Charity fundraiser in the Last Year: Not on file  . Ran Out of Food in the Last Year: Not on file  Transportation Needs:   . Lack of Transportation (Medical): Not on file  . Lack of Transportation (Non-Medical): Not on file  Physical Activity:   . Days of Exercise per Week: Not on file  . Minutes of Exercise per Session: Not on file  Stress:   . Feeling of Stress : Not on file  Social Connections:   . Frequency of Communication with Friends and Family: Not on file  . Frequency of Social Gatherings with Friends and Family: Not on file  . Attends Religious Services: Not on file  . Active Member of Clubs or Organizations: Not on file  . Attends Archivist Meetings: Not on file  . Marital Status: Not on file  Intimate Partner Violence:   . Fear of Current or Ex-Partner: Not on file  . Emotionally Abused: Not on file  . Physically Abused: Not on file  . Sexually Abused: Not on file    FAMILY HISTORY: Family History  Problem Relation Age of Onset  . Pancreatic cancer Mother   . Prostate cancer Father   . Breast cancer Sister   . Bladder Cancer Sister     ALLERGIES:  is allergic to ibuprofen and penicillins.  MEDICATIONS:  Current Outpatient  Medications  Medication Sig Dispense Refill  . benazepril-hydrochlorthiazide (LOTENSIN HCT) 20-12.5 MG tablet Take 1 tablet by mouth daily.     . cyanocobalamin 1000 MCG tablet Take 1,000 mcg by mouth daily.     . dabigatran (PRADAXA) 150 MG CAPS capsule Take 150 mg by mouth 2 (two) times daily.     Marland Kitchen FLUZONE HIGH-DOSE QUADRIVALENT 0.7 ML SUSY     . Multiple Vitamins-Minerals (PRESERVISION AREDS 2 PO) Take 1 capsule by mouth in the morning and at bedtime.     . nitrofurantoin, macrocrystal-monohydrate, (MACROBID) 100 MG capsule Take 1 capsule (100 mg total) by mouth 2 (two) times daily. (Patient not taking: Reported on 09/13/2020) 14 capsule 0  . pantoprazole (PROTONIX) 40 MG tablet Take 40 mg by mouth daily.     . sildenafil (REVATIO) 20 MG tablet 3-5 tablets daily as needed    . tamsulosin (FLOMAX) 0.4 MG CAPS capsule Take 0.4 mg by mouth at bedtime.     . timolol (TIMOPTIC) 0.5 % ophthalmic solution Place 1 drop into the right eye daily.    . Vibegron (GEMTESA) 75 MG TABS Take 75 mg by mouth daily. 28 tablet  0   No current facility-administered medications for this visit.     PHYSICAL EXAMINATION: ECOG PERFORMANCE STATUS: 1 - Symptomatic but completely ambulatory Vitals:   09/20/20 0924  BP: 126/61  Pulse: (!) 53  Resp: 18  Temp: (!) 97.3 F (36.3 C)   Filed Weights   09/20/20 0924  Weight: 164 lb 9.6 oz (74.7 kg)    Physical Exam Constitutional:      General: He is not in acute distress. HENT:     Head: Normocephalic and atraumatic.  Eyes:     General: No scleral icterus. Cardiovascular:     Rate and Rhythm: Normal rate and regular rhythm.     Heart sounds: Normal heart sounds.  Pulmonary:     Effort: Pulmonary effort is normal. No respiratory distress.     Breath sounds: No wheezing.  Abdominal:     General: Bowel sounds are normal. There is no distension.     Palpations: Abdomen is soft.  Musculoskeletal:        General: No deformity. Normal range of motion.      Cervical back: Normal range of motion and neck supple.  Skin:    General: Skin is warm and dry.     Findings: No erythema or rash.  Neurological:     Mental Status: He is alert and oriented to person, place, and time. Mental status is at baseline.     Cranial Nerves: No cranial nerve deficit.     Coordination: Coordination normal.  Psychiatric:        Mood and Affect: Mood normal.     LABORATORY DATA:  I have reviewed the data as listed Lab Results  Component Value Date   WBC 4.6 09/13/2020   HGB 12.6 (L) 09/13/2020   HCT 38.8 (L) 09/13/2020   MCV 84.7 09/13/2020   PLT 246 09/13/2020   Recent Labs    06/10/20 1902 06/10/20 1902 08/20/20 0813 08/20/20 0813 08/30/20 0901 09/06/20 0844 09/13/20 0941  NA 133*   < > 137   < > 140 137 137  K 3.6   < > 4.2   < > 4.5 4.3 4.0  CL 99   < > 102   < > 104 102 102  CO2 24   < > 28   < > 29 27 26   GLUCOSE 147*   < > 109*   < > 109* 108* 104*  BUN 21   < > 16   < > 19 22 20   CREATININE 1.06   < > 0.90   < > 0.94 0.84 0.93  CALCIUM 8.7*   < > 8.9   < > 9.2 9.1 8.9  GFRNONAA >60   < > >60   < > >60 >60 >60  GFRAA >60  --  >60  --  >60  --   --   PROT 7.0   < > 7.0   < > 7.1 7.4 7.1  ALBUMIN 3.5   < > 3.7   < > 3.6 3.8 3.8  AST 21   < > 22   < > 21 19 22   ALT 20   < > 21   < > 20 18 23   ALKPHOS 81   < > 72   < > 85 90 77  BILITOT 1.1   < > 0.7   < > 0.8 0.8 0.7   < > = values in this interval not displayed.   Iron/TIBC/Ferritin/ %Sat No results found for:  IRON, TIBC, FERRITIN, IRONPCTSAT    RADIOGRAPHIC STUDIES: I have personally reviewed the radiological images as listed and agreed with the findings in the report. No results found.    ASSESSMENT & PLAN:  1. Urothelial carcinoma of bladder ([place])   2. Overactive bladder   3. Encounter for antineoplastic chemotherapy    Bladder urothelial carcinoma tumor size is more than 5 cm, intermediate risk group Intravesical therapy is suggested following TURBT.  Recommend 6  weekly treatment of gemcitabine  Labs are reviewed and discussed with patient Counts acceptable to proceed with intravesical gemcitabine today.  Plan total 6 cycles. Urine analysis showed RBC >50, trace leukocytes.  I will send urine for culture.  Patient does not have any signs of infection clinically.  #-overreactive bladder symptoms Continue Gemtesa  #Mild leukopenia, with normal differential.  Reviewed with patient and wife.  Continue monitor.  Supportive care measures are necessary for patient well-being and will be provided as necessary. We spent sufficient time to discuss many aspect of care, questions were answered to patient's satisfaction.   All questions were answered. The patient knows to call the clinic with any problems questions or concerns. Return of visit: 1 week   Earlie Server, MD, PhD Hematology Oncology [facility] at [facility] Pager- [phone] 09/20/2020

## 2020-09-20 NOTE — Progress Notes (Signed)
Simple Catheter Placement  Due to bladder cancer treatment,patient is present today for a foley cath placement.  Patient was cleaned and prepped in a sterile fashion with betadine. A 18 FR foley catheter was inserted, urine return was noted  45ml, urine was pink yellow in color.  The balloon was filled with 10cc of sterile water.  A catheter plug was placed.  Patient will now go to the cancer center for his treatment and then they will remove Foley.  Patient tolerated well, no complications were noted   Performed by: Zara Council, PA-C  Additional notes/ Follow up: Follow up in one week

## 2020-09-20 NOTE — Progress Notes (Signed)
Patient denies new problems/concerns today.   °

## 2020-09-20 NOTE — Progress Notes (Signed)
Per Dr. Tasia Catchings okay to proceed with treatment with 09/20/20 UA results. Urine culture has been added. Per patient, took [place] at home prior to reporting to clinic.   1040: Gemcitabine instilled per Foley, clamps applied.  1140:  Pt able to tolerate treatment for 60 minutes. No distress noted, and pt denies pain. Clamps removed from Foley.  1153: Foley removed and pt discharged. Pt tolerated treatment well with no distress noted. Pt stable at discharge.

## 2020-09-21 ENCOUNTER — Telehealth: Payer: Self-pay | Admitting: *Deleted

## 2020-09-21 LAB — URINE CULTURE: Culture: NO GROWTH

## 2020-09-21 NOTE — Telephone Encounter (Signed)
Let pt know that urine culture is negative. If fever has resolved, continue close monitor. Thanks.

## 2020-09-21 NOTE — Telephone Encounter (Signed)
Call from [place] reporting that patient had chills and fever of 100.6 yesterday evening when he got home form his treatment. She states that he is fine this morning with no fever or chills.

## 2020-09-26 NOTE — Progress Notes (Signed)
Simple Catheter Placement  Due to bladder cancer treatments patient is present today for a foley cath placement.  Patient was cleaned and prepped in a sterile fashion with betadine. An 18 FR foley catheter was inserted, urine return was noted  100 ml, urine was yellow clear in color.  The balloon was filled with 10cc of sterile water.  A catheter plug was placed.  He will have his Foley removed by the cancer center after his treatment.  Patient tolerated well, no complications were noted   Performed by: Zara Council, PA-C

## 2020-09-27 ENCOUNTER — Encounter: Payer: Self-pay | Admitting: Oncology

## 2020-09-27 ENCOUNTER — Inpatient Hospital Stay: Payer: PPO

## 2020-09-27 ENCOUNTER — Inpatient Hospital Stay (HOSPITAL_BASED_OUTPATIENT_CLINIC_OR_DEPARTMENT_OTHER): Payer: PPO | Admitting: Oncology

## 2020-09-27 ENCOUNTER — Encounter: Payer: Self-pay | Admitting: Urology

## 2020-09-27 ENCOUNTER — Other Ambulatory Visit: Payer: Self-pay

## 2020-09-27 ENCOUNTER — Ambulatory Visit (INDEPENDENT_AMBULATORY_CARE_PROVIDER_SITE_OTHER): Payer: PPO | Admitting: Urology

## 2020-09-27 VITALS — BP 138/76 | HR 65 | Temp 97.7°F | Resp 18 | Wt 162.1 lb

## 2020-09-27 DIAGNOSIS — Z5111 Encounter for antineoplastic chemotherapy: Secondary | ICD-10-CM

## 2020-09-27 DIAGNOSIS — C679 Malignant neoplasm of bladder, unspecified: Secondary | ICD-10-CM

## 2020-09-27 LAB — CBC WITH DIFFERENTIAL/PLATELET
Abs Immature Granulocytes: 0.01 10*3/uL (ref 0.00–0.07)
Basophils Absolute: 0 10*3/uL (ref 0.0–0.1)
Basophils Relative: 0 %
Eosinophils Absolute: 0.1 10*3/uL (ref 0.0–0.5)
Eosinophils Relative: 2 %
HCT: 37.6 % — ABNORMAL LOW (ref 39.0–52.0)
Hemoglobin: 12.2 g/dL — ABNORMAL LOW (ref 13.0–17.0)
Immature Granulocytes: 0 %
Lymphocytes Relative: 37 %
Lymphs Abs: 1.9 10*3/uL (ref 0.7–4.0)
MCH: 27.5 pg (ref 26.0–34.0)
MCHC: 32.4 g/dL (ref 30.0–36.0)
MCV: 84.9 fL (ref 80.0–100.0)
Monocytes Absolute: 0.5 10*3/uL (ref 0.1–1.0)
Monocytes Relative: 11 %
Neutro Abs: 2.6 10*3/uL (ref 1.7–7.7)
Neutrophils Relative %: 50 %
Platelets: 194 10*3/uL (ref 150–400)
RBC: 4.43 MIL/uL (ref 4.22–5.81)
RDW: 16 % — ABNORMAL HIGH (ref 11.5–15.5)
WBC: 5.1 10*3/uL (ref 4.0–10.5)
nRBC: 0 % (ref 0.0–0.2)

## 2020-09-27 LAB — COMPREHENSIVE METABOLIC PANEL
ALT: 35 U/L (ref 0–44)
AST: 26 U/L (ref 15–41)
Albumin: 3.7 g/dL (ref 3.5–5.0)
Alkaline Phosphatase: 89 U/L (ref 38–126)
Anion gap: 8 (ref 5–15)
BUN: 18 mg/dL (ref 8–23)
CO2: 27 mmol/L (ref 22–32)
Calcium: 8.8 mg/dL — ABNORMAL LOW (ref 8.9–10.3)
Chloride: 102 mmol/L (ref 98–111)
Creatinine, Ser: 0.89 mg/dL (ref 0.61–1.24)
GFR, Estimated: >60 mL/min (ref >60)
Glucose, Bld: 106 mg/dL — ABNORMAL HIGH (ref 70–99)
Potassium: 4 mmol/L (ref 3.5–5.1)
Sodium: 137 mmol/L (ref 135–145)
Total Bilirubin: 0.6 mg/dL (ref 0.3–1.2)
Total Protein: 6.9 g/dL (ref 6.5–8.1)

## 2020-09-27 LAB — URINALYSIS, COMPLETE (UACMP) WITH MICROSCOPIC
Bacteria, UA: NONE SEEN
Bilirubin Urine: NEGATIVE
Glucose, UA: NEGATIVE mg/dL
Hgb urine dipstick: NEGATIVE
Ketones, ur: NEGATIVE mg/dL
Leukocytes,Ua: NEGATIVE
Nitrite: NEGATIVE
Protein, ur: NEGATIVE mg/dL
Specific Gravity, Urine: 1.013 (ref 1.005–1.030)
Squamous Epithelial / HPF: NONE SEEN (ref 0–5)
pH: 7 (ref 5.0–8.0)

## 2020-09-27 MED ORDER — GEMCITABINE CHEMO FOR BLADDER INSTILLATION 2000 MG
2000.0000 mg | Freq: Once | INTRAVENOUS | Status: AC
  Administered 2020-09-27: 2000 mg via INTRAVESICAL
  Filled 2020-09-27: qty 52.6

## 2020-09-27 MED ORDER — PROCHLORPERAZINE MALEATE 10 MG PO TABS
10.0000 mg | ORAL_TABLET | Freq: Once | ORAL | Status: AC
  Administered 2020-09-27: 10 mg via ORAL
  Filled 2020-09-27: qty 1

## 2020-09-27 NOTE — Progress Notes (Signed)
Pt here for follow up. After last treatment patient had fever and chills. It lasted a couple of days.

## 2020-09-27 NOTE — Progress Notes (Signed)
Hematology/Oncology  Follow up note [facility] Telephone:(336) [phone] Fax:(336) [phone]   Patient Care Team: Idelle Crouch, MD as PCP - General (Internal Medicine)  REFERRING PROVIDER: Idelle Crouch, MD  CHIEF COMPLAINTS/REASON FOR VISIT:  Follow up for intravesical chemotherapy  HISTORY OF PRESENTING ILLNESS:   Darren Wright is a  86 y.o.  male with PMH including squamous cell carcinoma of the skin, hypertension, GERD, basal cell carcinoma, A. fib on anticoagulation, newly diagnosed nonmuscle invasive bladder cancer was seen in consultation at the request of  Idelle Crouch, MD  for evaluation of intravesical chemotherapy. Patient had CT scan done in January for evaluation of low abdominal pain and was diagnosed with diverticulitis.  Symptoms improved after antibiotics course. There was incidental finding of indeterminate soft tissue thickening at the right base of bladder.  As well as enlarged and heterogeneously enhancing prostate.  Patient denies any hematuria, difficulty urination. Patient was referred to see Dr. Bernardo Heater and patient had cystoscopy on 07/17/2020.  Intraoperative findings showed bladder tumor, involving the right posterior wall, right lateral wall and the bladder base extending to the bladder neck.  The posterior anterior dimension was 6 cm in the lateral medial dimension 5 cm.  Patient underwent TURBT for resection of the tumor. Pathology showed noninvasive papillary urothelial carcinoma, low-grade, muscularis propria present and uninvolved.   Given the large size of bladder tumor, Dr. Bernardo Heater recommend intravesical chemo Patient was referred to establish care with oncology for intravesical chemotherapy.  # UTI with  E coli faecalis, finished antibiotic course with Macrobid.    INTERVAL HISTORY Darren Wright is a 86 y.o. male who has above history reviewed by me today presents for follow up visit for Intravesical gemcitabine  treatments for bladder cancer  Problems and complaints are listed below:      Patient reports doing very well.   He had a fever of 100.6 and chills after last treatment. The next day, temperature was 99.6-100.  Today he is afebrile. No chills, abdominal pain.  Urine culture was obtained at last visit and showed no growth.   Review of Systems  Constitutional: Negative for appetite change, chills, fatigue, fever and unexpected weight change.  HENT:   Negative for hearing loss and voice change.   Eyes: Negative for eye problems and icterus.  Respiratory: Negative for chest tightness, cough and shortness of breath.   Cardiovascular: Negative for chest pain and leg swelling.  Gastrointestinal: Negative for abdominal distention and abdominal pain.  Endocrine: Negative for hot flashes.  Genitourinary: Negative for difficulty urinating, dysuria and frequency.   Musculoskeletal: Negative for arthralgias.  Skin: Negative for itching and rash.  Neurological: Negative for light-headedness and numbness.  Hematological: Negative for adenopathy. Does not bruise/bleed easily.  Psychiatric/Behavioral: Negative for confusion.    MEDICAL HISTORY:  Past Medical History:  Diagnosis Date  . A-fib ([place])   . Basal cell carcinoma 12/11/2016   left medial cheek  . GERD (gastroesophageal reflux disease)   . Hypertension   . Squamous cell carcinoma of skin 03/10/2018   left crown scalp    SURGICAL HISTORY: Past Surgical History:  Procedure Laterality Date  . CHOLECYSTECTOMY    . CYSTOSCOPY W/ RETROGRADES Bilateral 07/17/2020   Procedure: CYSTOSCOPY WITH RETROGRADE PYELOGRAM;  Surgeon: Abbie Sons, MD;  Location: [facility] ORS;  Service: Urology;  Laterality: Bilateral;  . TONSILLECTOMY    . TRANSURETHRAL RESECTION OF BLADDER TUMOR WITH MITOMYCIN-C N/A 07/17/2020   Procedure: TRANSURETHRAL RESECTION OF  BLADDER TUMOR WITH gemcitabine;  Surgeon: Abbie Sons, MD;  Location: [facility] ORS;  Service: Urology;   Laterality: N/A;    SOCIAL HISTORY: Social History   Socioeconomic History  . Marital status: Married    Spouse name: Not on file  . Number of children: Not on file  . Years of education: Not on file  . Highest education level: Not on file  Occupational History  . Not on file  Tobacco Use  . Smoking status: Former Smoker    Types: Cigarettes    Quit date: 1985    Years since quitting: 36.8  . Smokeless tobacco: Never Used  Substance and Sexual Activity  . Alcohol use: Not Currently  . Drug use: Never  . Sexual activity: Not Currently    Birth control/protection: None  Other Topics Concern  . Not on file  Social History Narrative  . Not on file   Social Determinants of Health   Financial Resource Strain:   . Difficulty of Paying Living Expenses: Not on file  Food Insecurity:   . Worried About Charity fundraiser in the Last Year: Not on file  . Ran Out of Food in the Last Year: Not on file  Transportation Needs:   . Lack of Transportation (Medical): Not on file  . Lack of Transportation (Non-Medical): Not on file  Physical Activity:   . Days of Exercise per Week: Not on file  . Minutes of Exercise per Session: Not on file  Stress:   . Feeling of Stress : Not on file  Social Connections:   . Frequency of Communication with Friends and Family: Not on file  . Frequency of Social Gatherings with Friends and Family: Not on file  . Attends Religious Services: Not on file  . Active Member of Clubs or Organizations: Not on file  . Attends Archivist Meetings: Not on file  . Marital Status: Not on file  Intimate Partner Violence:   . Fear of Current or Ex-Partner: Not on file  . Emotionally Abused: Not on file  . Physically Abused: Not on file  . Sexually Abused: Not on file    FAMILY HISTORY: Family History  Problem Relation Age of Onset  . Pancreatic cancer Mother   . Prostate cancer Father   . Breast cancer Sister   . Bladder Cancer Sister      ALLERGIES:  is allergic to ibuprofen and penicillins.  MEDICATIONS:  Current Outpatient Medications  Medication Sig Dispense Refill  . benazepril-hydrochlorthiazide (LOTENSIN HCT) 20-12.5 MG tablet Take 1 tablet by mouth daily.     . cyanocobalamin 1000 MCG tablet Take 1,000 mcg by mouth daily.     . dabigatran (PRADAXA) 150 MG CAPS capsule Take 150 mg by mouth 2 (two) times daily.     . Multiple Vitamins-Minerals (PRESERVISION AREDS 2 PO) Take 1 capsule by mouth in the morning and at bedtime.     . pantoprazole (PROTONIX) 40 MG tablet Take 40 mg by mouth daily.     . tamsulosin (FLOMAX) 0.4 MG CAPS capsule Take 0.4 mg by mouth at bedtime.     . timolol (TIMOPTIC) 0.5 % ophthalmic solution Place 1 drop into the right eye daily.    . Vibegron (GEMTESA) 75 MG TABS Take 75 mg by mouth daily. 28 tablet 0   No current facility-administered medications for this visit.     PHYSICAL EXAMINATION: ECOG PERFORMANCE STATUS: 1 - Symptomatic but completely ambulatory Vitals:   09/27/20 0857  BP: 138/76  Pulse: 65  Resp: 18  Temp: 97.7 F (36.5 C)   Filed Weights   09/27/20 0857  Weight: 162 lb 1.6 oz (73.5 kg)    Physical Exam Constitutional:      General: He is not in acute distress. HENT:     Head: Normocephalic and atraumatic.  Eyes:     General: No scleral icterus. Cardiovascular:     Rate and Rhythm: Normal rate and regular rhythm.     Heart sounds: Normal heart sounds.  Pulmonary:     Effort: Pulmonary effort is normal. No respiratory distress.     Breath sounds: No wheezing.  Abdominal:     General: Bowel sounds are normal. There is no distension.     Palpations: Abdomen is soft.  Musculoskeletal:        General: No deformity. Normal range of motion.     Cervical back: Normal range of motion and neck supple.  Skin:    General: Skin is warm and dry.     Findings: No erythema or rash.  Neurological:     Mental Status: He is alert and oriented to person, place, and  time. Mental status is at baseline.     Cranial Nerves: No cranial nerve deficit.     Coordination: Coordination normal.  Psychiatric:        Mood and Affect: Mood normal.     LABORATORY DATA:  I have reviewed the data as listed Lab Results  Component Value Date   WBC 5.1 09/27/2020   HGB 12.2 (L) 09/27/2020   HCT 37.6 (L) 09/27/2020   MCV 84.9 09/27/2020   PLT 194 09/27/2020   Recent Labs    06/10/20 1902 06/10/20 1902 08/20/20 0813 08/20/20 0813 08/30/20 0901 09/06/20 0844 09/13/20 0941 09/20/20 0859 09/27/20 0829  NA 133*   < > 137   < > 140   < > 137 139 137  K 3.6   < > 4.2   < > 4.5   < > 4.0 4.1 4.0  CL 99   < > 102   < > 104   < > 102 104 102  CO2 24   < > 28   < > 29   < > 26 27 27   GLUCOSE 147*   < > 109*   < > 109*   < > 104* 103* 106*  BUN 21   < > 16   < > 19   < > 20 20 18   CREATININE 1.06   < > 0.90   < > 0.94   < > 0.93 0.84 0.89  CALCIUM 8.7*   < > 8.9   < > 9.2   < > 8.9 9.0 8.8*  GFRNONAA >60   < > >60   < > >60   < > >60 >60 >60  GFRAA >60  --  >60  --  >60  --   --   --   --   PROT 7.0   < > 7.0   < > 7.1   < > 7.1 6.9 6.9  ALBUMIN 3.5   < > 3.7   < > 3.6   < > 3.8 3.7 3.7  AST 21   < > 22   < > 21   < > 22 21 26   ALT 20   < > 21   < > 20   < > 23 20 35  ALKPHOS 81   < >  72   < > 85   < > 77 81 89  BILITOT 1.1   < > 0.7   < > 0.8   < > 0.7 0.7 0.6   < > = values in this interval not displayed.   Iron/TIBC/Ferritin/ %Sat No results found for: IRON, TIBC, FERRITIN, IRONPCTSAT    RADIOGRAPHIC STUDIES: I have personally reviewed the radiological images as listed and agreed with the findings in the report. No results found.    ASSESSMENT & PLAN:  1. Urothelial carcinoma of bladder ([place])   2. Encounter for antineoplastic chemotherapy    Bladder urothelial carcinoma tumor size is more than 5 cm, intermediate risk group Intravesical therapy is suggested following TURBT.  Recommend 6 weekly treatment of gemcitabine  Labs are reviewed and  discussed with patient. Counts are acceptable to proceed with Cycle 6 gemcitabine.  UA showed no leukocytes, nitrate. Few RBCs.   I recommend him to schedule follow up appointment with Dr.Stoiff for cystoscopy surveillance.  He does not need to further follow up with me at this point.  He can re-establish care in the future if needed.  Supportive care measures are necessary for patient well-being and will be provided as necessary. We spent sufficient time to discuss many aspect of care, questions were answered to patient's satisfaction.   All questions were answered. The patient knows to call the clinic with any problems questions or concerns.    Earlie Server, MD, PhD Hematology Oncology [facility] at [facility] Pager- [phone] 09/27/2020

## 2020-09-27 NOTE — Progress Notes (Signed)
Patient tolerated treatment. Foley catheter removed prior to discharge. Patient stable at discharge

## 2020-09-28 ENCOUNTER — Telehealth: Payer: Self-pay | Admitting: Urology

## 2020-09-28 NOTE — Telephone Encounter (Signed)
Mr. Elmquist will need to be scheduled for surveillance cystoscopy with Dr. Bernardo Heater in 3 months.

## 2020-09-28 NOTE — Telephone Encounter (Signed)
-----   Message from Earlie Server, MD sent at 09/27/2020  9:45 PM EDT ----- Dr.Stoiff and Larene Beach, Patient had 6th cycle of intravesical gemcitabine. I advise him to follow up with your team for surveillance. Thank you Talbert Cage

## 2020-10-08 DIAGNOSIS — H353211 Exudative age-related macular degeneration, right eye, with active choroidal neovascularization: Secondary | ICD-10-CM | POA: Diagnosis not present

## 2020-10-16 DIAGNOSIS — H401111 Primary open-angle glaucoma, right eye, mild stage: Secondary | ICD-10-CM | POA: Diagnosis not present

## 2020-11-26 DIAGNOSIS — H353211 Exudative age-related macular degeneration, right eye, with active choroidal neovascularization: Secondary | ICD-10-CM | POA: Diagnosis not present

## 2020-12-17 ENCOUNTER — Ambulatory Visit: Payer: PPO | Admitting: Dermatology

## 2020-12-20 DIAGNOSIS — J449 Chronic obstructive pulmonary disease, unspecified: Secondary | ICD-10-CM | POA: Diagnosis not present

## 2020-12-20 DIAGNOSIS — I10 Essential (primary) hypertension: Secondary | ICD-10-CM | POA: Diagnosis not present

## 2020-12-20 DIAGNOSIS — R001 Bradycardia, unspecified: Secondary | ICD-10-CM | POA: Diagnosis not present

## 2020-12-20 DIAGNOSIS — I480 Paroxysmal atrial fibrillation: Secondary | ICD-10-CM | POA: Diagnosis not present

## 2020-12-20 DIAGNOSIS — E785 Hyperlipidemia, unspecified: Secondary | ICD-10-CM | POA: Diagnosis not present

## 2021-01-02 ENCOUNTER — Ambulatory Visit: Payer: PPO | Admitting: Urology

## 2021-01-02 ENCOUNTER — Encounter: Payer: Self-pay | Admitting: Urology

## 2021-01-02 ENCOUNTER — Other Ambulatory Visit: Payer: Self-pay

## 2021-01-02 VITALS — BP 176/101 | HR 90 | Ht 71.0 in | Wt 167.0 lb

## 2021-01-02 DIAGNOSIS — C679 Malignant neoplasm of bladder, unspecified: Secondary | ICD-10-CM | POA: Diagnosis not present

## 2021-01-02 NOTE — Progress Notes (Signed)
   01/02/21  CC:  Chief Complaint  Patient presents with  . Cysto    Urologic history:  TURBT 07/17/2020; >5 cm papillary tumor  Pathology low-grade Ta urothelial carcinoma  Completed 6-week course intravesical gemcitabine October 2021 for intermediate risk disease   HPI: 87 y.o. male presents for surveillance cystoscopy.  No voiding complaints or gross hematuria  Blood pressure (!) 176/101, pulse 90, height 5\' 11"  (1.803 m), weight 167 lb (75.8 kg). NED. A&Ox3.   No respiratory distress   Abd soft, NT, ND Normal phallus with bilateral descended testicles  Cystoscopy Procedure Note  Patient identification was confirmed, informed consent was obtained, and patient was prepped using Betadine solution.  Lidocaine jelly was administered per urethral meatus.     Pre-Procedure: - Inspection reveals a normal caliber urethral meatus.  Procedure: The flexible cystoscope was introduced without difficulty - No urethral strictures/lesions are present. - Marked lateral lobe enlargement prostate  - Large median lobe  - Bilateral ureteral orifices identified - Bladder mucosa  reveals no ulcers, tumors, or lesions - No bladder stones - Moderate trabeculation  Retroflexion shows intravesical median lobe   Post-Procedure: - Patient tolerated the procedure well  Assessment/ Plan:  No evidence of recurrent urothelial carcinoma  Follow-up surveillance cystoscopy 9 months   Abbie Sons, MD

## 2021-01-03 ENCOUNTER — Telehealth: Payer: Self-pay | Admitting: Urology

## 2021-01-03 LAB — MICROSCOPIC EXAMINATION: Bacteria, UA: NONE SEEN

## 2021-01-03 LAB — URINALYSIS, COMPLETE
Bilirubin, UA: NEGATIVE
Glucose, UA: NEGATIVE
Ketones, UA: NEGATIVE
Leukocytes,UA: NEGATIVE
Nitrite, UA: NEGATIVE
Protein,UA: NEGATIVE
RBC, UA: NEGATIVE
Specific Gravity, UA: 1.02 (ref 1.005–1.030)
Urobilinogen, Ur: 0.2 mg/dL (ref 0.2–1.0)
pH, UA: 6 (ref 5.0–7.5)

## 2021-01-03 NOTE — Telephone Encounter (Signed)
Patient called the triage line after hours requesting an early morning appt for fever, burning during urination.  Patient had a cysto in the office with Dr. Bernardo Heater on 01/02/21.  Added the patient to Sam's schedule 01/04/21 at 8:30am.  Instructed patient to call the after hours nurse line or go to the emergency room if symptoms get worse during the night.

## 2021-01-04 ENCOUNTER — Encounter: Payer: Self-pay | Admitting: Physician Assistant

## 2021-01-04 ENCOUNTER — Other Ambulatory Visit: Payer: Self-pay

## 2021-01-04 ENCOUNTER — Ambulatory Visit: Payer: PPO | Admitting: Physician Assistant

## 2021-01-04 VITALS — BP 149/69 | HR 98 | Temp 98.8°F | Ht 71.0 in | Wt 170.0 lb

## 2021-01-04 DIAGNOSIS — N3001 Acute cystitis with hematuria: Secondary | ICD-10-CM

## 2021-01-04 DIAGNOSIS — C679 Malignant neoplasm of bladder, unspecified: Secondary | ICD-10-CM | POA: Diagnosis not present

## 2021-01-04 LAB — URINALYSIS, COMPLETE
Bilirubin, UA: NEGATIVE
Glucose, UA: NEGATIVE
Ketones, UA: NEGATIVE
Nitrite, UA: POSITIVE — AB
Specific Gravity, UA: 1.015 (ref 1.005–1.030)
Urobilinogen, Ur: 0.2 mg/dL (ref 0.2–1.0)
pH, UA: 5.5 (ref 5.0–7.5)

## 2021-01-04 LAB — BLADDER SCAN AMB NON-IMAGING

## 2021-01-04 LAB — MICROSCOPIC EXAMINATION: WBC, UA: >30 /hpf — AB (ref 0–5)

## 2021-01-04 MED ORDER — LEVOFLOXACIN 500 MG PO TABS: 500.0000 mg | ORAL_TABLET | Freq: Every day | ORAL | 0 refills | Status: DC

## 2021-01-04 MED ORDER — CEFTRIAXONE SODIUM 1 G IJ SOLR
1.0000 g | Freq: Once | INTRAMUSCULAR | Status: AC
  Administered 2021-01-04: 1 g via INTRAMUSCULAR

## 2021-01-04 NOTE — Progress Notes (Signed)
01/04/2021 9:30 AM   Darren Wright 1929/10/24 [id number]  CC: Chief Complaint  Patient presents with  . Dysuria   HPI: Darren Wright is a 87 y.o. male with PMH low-grade TA urothelial carcinoma s/p intravesical gemcitabine x6 weeks and urinary retention who underwent surveillance cystoscopy with Dr. Bernardo Heater 2 days ago who presents today for evaluation of possible UTI.  Today patient reports a 1 day history of dysuria, chills, nocturia/frequency, and fever with T-max 102 F.  He has taken Tylenol for management of his fever, most recently this morning.  Additionally, he states he noted a week urine flow overnight despite Flomax that has since resolved.  He underwent CTAP with contrast on 06/11/2020 with no evident urolithiasis.  Most recent positive urine culture dated 08/30/2020 with ampicillin, nitrofurantoin, and vancomycin sensitive Enterococcus faecalis.  Notably, patient has a possible penicillin allergy.  [facility] antibiogram shows 84% susceptibility to levofloxacin from facility urine isolates in 2020.  In-office UA today positive for 1+ blood, trace protein, nitrites, and 1+ leukocyte esterase; urine microscopy with >30 WBCs/HPF, 3-10 RBCs/HPF, and many bacteria. PVR 62mL.  PMH: Past Medical History:  Diagnosis Date  . A-fib ([place])   . Basal cell carcinoma 12/11/2016   left medial cheek  . GERD (gastroesophageal reflux disease)   . Hypertension   . Squamous cell carcinoma of skin 03/10/2018   left crown scalp    Surgical History: Past Surgical History:  Procedure Laterality Date  . CHOLECYSTECTOMY    . CYSTOSCOPY W/ RETROGRADES Bilateral 07/17/2020   Procedure: CYSTOSCOPY WITH RETROGRADE PYELOGRAM;  Surgeon: Abbie Sons, MD;  Location: [facility] ORS;  Service: Urology;  Laterality: Bilateral;  . TONSILLECTOMY    . TRANSURETHRAL RESECTION OF BLADDER TUMOR WITH MITOMYCIN-C N/A 07/17/2020   Procedure: TRANSURETHRAL RESECTION OF BLADDER TUMOR WITH gemcitabine;  Surgeon:  Abbie Sons, MD;  Location: [facility] ORS;  Service: Urology;  Laterality: N/A;    Home Medications:  Allergies as of 01/04/2021      Reactions   Ibuprofen Hives   Penicillins    Swelling 1 week after having injection. Unsure if it was really the cause.      Medication List       Accurate as of January 04, 2021  9:30 AM. If you have any questions, ask your nurse or doctor.        benazepril-hydrochlorthiazide 20-12.5 MG tablet Commonly known as: LOTENSIN HCT Take 1 tablet by mouth daily.   cyanocobalamin 1000 MCG tablet Take 1,000 mcg by mouth daily.   dabigatran 150 MG Caps capsule Commonly known as: PRADAXA Take 150 mg by mouth 2 (two) times daily.   Gemtesa 75 MG Tabs Generic drug: Vibegron Take 75 mg by mouth daily.   levofloxacin 500 MG tablet Commonly known as: LEVAQUIN Take 1 tablet (500 mg total) by mouth daily. Started by: Debroah Loop, PA-C   pantoprazole 40 MG tablet Commonly known as: PROTONIX Take 40 mg by mouth daily.   PRESERVISION AREDS 2 PO Take 1 capsule by mouth in the morning and at bedtime.   tamsulosin 0.4 MG Caps capsule Commonly known as: FLOMAX Take 0.4 mg by mouth at bedtime.   timolol 0.5 % ophthalmic solution Commonly known as: TIMOPTIC Place 1 drop into the right eye daily.       Allergies:  Allergies  Allergen Reactions  . Ibuprofen Hives  . Penicillins     Swelling 1 week after having injection. Unsure if it was really the cause.  Family History: Family History  Problem Relation Age of Onset  . Pancreatic cancer Mother   . Prostate cancer Father   . Breast cancer Sister   . Bladder Cancer Sister     Social History:   reports that he quit smoking about 37 years ago. His smoking use included cigarettes. He has never used smokeless tobacco. He reports previous alcohol use. He reports that he does not use drugs.  Physical Exam: BP (!) 149/69   Pulse 98   Temp 98.8 F (37.1 C) (Oral)   Ht 5\' 11"  (1.803  m)   Wt 170 lb (77.1 kg)   BMI 23.71 kg/m   Constitutional:  Alert and oriented, no acute distress, nontoxic appearing HEENT: [place], AT Cardiovascular: No clubbing, cyanosis, or edema Respiratory: Normal respiratory effort, no increased work of breathing Skin: No rashes, bruises or suspicious lesions Neurologic: Grossly intact, no focal deficits, moving all 4 extremities Psychiatric: Normal mood and affect  Laboratory Data: Results for orders placed or performed in visit on 01/04/21  CULTURE, URINE COMPREHENSIVE   Specimen: Urine   UR  Result Value Ref Range   Urine Culture, Comprehensive Preliminary report (A)    Organism ID, Bacteria Escherichia coli (A)    Organism ID, Bacteria Comment   Microscopic Examination   Urine  Result Value Ref Range   WBC, UA >30 (A) 0 - 5 /hpf   RBC 3-10 (A) 0 - 2 /hpf   Epithelial Cells (non renal) 0-10 0 - 10 /hpf   Bacteria, UA Many (A) None seen/Few  Urinalysis, Complete  Result Value Ref Range   Specific Gravity, UA 1.015 1.005 - 1.030   pH, UA 5.5 5.0 - 7.5   Color, UA Yellow Yellow   Appearance Ur Cloudy (A) Clear   Leukocytes,UA 1+ (A) Negative   Protein,UA Trace (A) Negative/Trace   Glucose, UA Negative Negative   Ketones, UA Negative Negative   RBC, UA 1+ (A) Negative   Bilirubin, UA Negative Negative   Urobilinogen, Ur 0.2 0.2 - 1.0 mg/dL   Nitrite, UA Positive (A) Negative   Microscopic Examination See below:   BLADDER SCAN AMB NON-IMAGING  Result Value Ref Range   Scan Result 49mL    Assessment & Plan:   1. Acute cystitis with hematuria Febrile UTI 2 days after undergoing cystoscopy.  VSS and afebrile on antipyretics in clinic today, no indication for urgent intervention.  We will treat with empiric ceftriaxone IM today and start levofloxacin 500 mg daily x7 days per review of patient's recent culture data, medication allergies, and local antibiogram.  I counseled the patient to proceed to the emergency department if his  symptoms worsen over the weekend, explaining that he may require IV antibiotics for clearance of his infection.  He expressed understanding.  Will plan for repeat UA in 1 week to prove resolution of MH on culture-appropriate antibiotics. - Urinalysis, Complete - CULTURE, URINE COMPREHENSIVE - levofloxacin (LEVAQUIN) 500 MG tablet; Take 1 tablet (500 mg total) by mouth daily.  Dispense: 7 tablet; Refill: 0 - cefTRIAXone (ROCEPHIN) injection 1 g - Urinalysis, Complete; Future   Return in about 1 week (around 01/11/2021) for Lab visit for UA.  Debroah Loop, PA-C  [facility] [address] [place], [place] [address] [phone]

## 2021-01-04 NOTE — Patient Instructions (Addendum)
You received one dose of Rocephin (ceftriaxone) in clinic today for a UTI with fever.   I have sent a prescription for oral antibiotics (Levaquin/levofloxacin) to [facility]. You will take the oral antibiotics once daily for one week, starting this evening.  If you start to feel worse rather than better, please go to the Emergency Department over the weekend, as you may need IV antibiotics to clear this infection.  You had a small amount of blood in your urine today, likely caused by your infection. I would like to recheck your urine in about 1 week to make sure the bleeding has resolved once you complete your antibiotics.

## 2021-01-07 NOTE — Addendum Note (Signed)
Addended by: Mickle Plumb on: 01/07/2021 08:41 AM   Modules accepted: Level of Service

## 2021-01-08 LAB — CULTURE, URINE COMPREHENSIVE

## 2021-01-10 ENCOUNTER — Telehealth: Payer: Self-pay | Admitting: Urology

## 2021-01-11 ENCOUNTER — Other Ambulatory Visit: Payer: PPO

## 2021-01-11 ENCOUNTER — Other Ambulatory Visit: Payer: Self-pay

## 2021-01-11 DIAGNOSIS — N3001 Acute cystitis with hematuria: Secondary | ICD-10-CM | POA: Diagnosis not present

## 2021-01-11 LAB — URINALYSIS, COMPLETE
Bilirubin, UA: NEGATIVE
Glucose, UA: NEGATIVE
Ketones, UA: NEGATIVE
Leukocytes,UA: NEGATIVE
Nitrite, UA: NEGATIVE
Protein,UA: NEGATIVE
RBC, UA: NEGATIVE
Specific Gravity, UA: 1.02 (ref 1.005–1.030)
Urobilinogen, Ur: 0.2 mg/dL (ref 0.2–1.0)
pH, UA: 5.5 (ref 5.0–7.5)

## 2021-01-11 LAB — MICROSCOPIC EXAMINATION

## 2021-01-21 DIAGNOSIS — H401111 Primary open-angle glaucoma, right eye, mild stage: Secondary | ICD-10-CM | POA: Diagnosis not present

## 2021-01-21 DIAGNOSIS — H353211 Exudative age-related macular degeneration, right eye, with active choroidal neovascularization: Secondary | ICD-10-CM | POA: Diagnosis not present

## 2021-01-21 DIAGNOSIS — H353221 Exudative age-related macular degeneration, left eye, with active choroidal neovascularization: Secondary | ICD-10-CM | POA: Diagnosis not present

## 2021-01-23 NOTE — Telephone Encounter (Signed)
error 

## 2021-02-14 ENCOUNTER — Ambulatory Visit: Payer: PPO | Admitting: Dermatology

## 2021-02-18 DIAGNOSIS — H353221 Exudative age-related macular degeneration, left eye, with active choroidal neovascularization: Secondary | ICD-10-CM | POA: Diagnosis not present

## 2021-02-19 DIAGNOSIS — I480 Paroxysmal atrial fibrillation: Secondary | ICD-10-CM | POA: Diagnosis not present

## 2021-02-19 DIAGNOSIS — J431 Panlobular emphysema: Secondary | ICD-10-CM | POA: Diagnosis not present

## 2021-02-19 DIAGNOSIS — E7800 Pure hypercholesterolemia, unspecified: Secondary | ICD-10-CM | POA: Diagnosis not present

## 2021-02-19 DIAGNOSIS — J449 Chronic obstructive pulmonary disease, unspecified: Secondary | ICD-10-CM | POA: Diagnosis not present

## 2021-02-19 DIAGNOSIS — Z79899 Other long term (current) drug therapy: Secondary | ICD-10-CM | POA: Diagnosis not present

## 2021-02-19 DIAGNOSIS — E538 Deficiency of other specified B group vitamins: Secondary | ICD-10-CM | POA: Diagnosis not present

## 2021-02-19 DIAGNOSIS — J439 Emphysema, unspecified: Secondary | ICD-10-CM | POA: Diagnosis not present

## 2021-02-19 DIAGNOSIS — I10 Essential (primary) hypertension: Secondary | ICD-10-CM | POA: Diagnosis not present

## 2021-02-19 DIAGNOSIS — Z1211 Encounter for screening for malignant neoplasm of colon: Secondary | ICD-10-CM | POA: Diagnosis not present

## 2021-02-19 DIAGNOSIS — Z0000 Encounter for general adult medical examination without abnormal findings: Secondary | ICD-10-CM | POA: Diagnosis not present

## 2021-02-26 DIAGNOSIS — Z1211 Encounter for screening for malignant neoplasm of colon: Secondary | ICD-10-CM | POA: Diagnosis not present

## 2021-03-18 DIAGNOSIS — H353211 Exudative age-related macular degeneration, right eye, with active choroidal neovascularization: Secondary | ICD-10-CM | POA: Diagnosis not present

## 2021-03-18 DIAGNOSIS — H353221 Exudative age-related macular degeneration, left eye, with active choroidal neovascularization: Secondary | ICD-10-CM | POA: Diagnosis not present

## 2021-03-22 ENCOUNTER — Ambulatory Visit: Payer: PPO | Attending: Internal Medicine

## 2021-03-22 DIAGNOSIS — Z23 Encounter for immunization: Secondary | ICD-10-CM

## 2021-03-22 NOTE — Progress Notes (Signed)
   Covid-19 Vaccination Clinic  Name:  Darren Wright    MRN: [id number] DOB: 11/30/1929  03/22/2021  Mr. Laws was observed post Covid-19 immunization for 15 minutes without incident. He was provided with Vaccine Information Sheet and instruction to access the V-Safe system.   [name] was instructed to call 911 with any severe reactions post vaccine: Marland Kitchen Difficulty breathing  . Swelling of face and throat  . A fast heartbeat  . A bad rash all over body  . Dizziness and weakness   Immunizations Administered    Name Date Dose VIS Date Route   PFIZER Comrnaty(Gray TOP) Covid-19 Vaccine 03/22/2021 10:36 AM 0.3 mL 11/08/2020 Intramuscular   Manufacturer: [employer]   Lot: [id number]   NDC: [phone]

## 2021-03-25 DIAGNOSIS — D649 Anemia, unspecified: Secondary | ICD-10-CM | POA: Diagnosis not present

## 2021-03-26 ENCOUNTER — Other Ambulatory Visit: Payer: Self-pay

## 2021-03-26 MED ORDER — PFIZER-BIONT COVID-19 VAC-TRIS 30 MCG/0.3ML IM SUSP
INTRAMUSCULAR | 0 refills | Status: DC
  Filled 2021-03-26: qty 0.3, 1d supply, fill #0

## 2021-04-16 DIAGNOSIS — H353221 Exudative age-related macular degeneration, left eye, with active choroidal neovascularization: Secondary | ICD-10-CM | POA: Diagnosis not present

## 2021-05-06 DIAGNOSIS — H40002 Preglaucoma, unspecified, left eye: Secondary | ICD-10-CM | POA: Diagnosis not present

## 2021-05-06 DIAGNOSIS — D649 Anemia, unspecified: Secondary | ICD-10-CM | POA: Diagnosis not present

## 2021-05-06 DIAGNOSIS — H401111 Primary open-angle glaucoma, right eye, mild stage: Secondary | ICD-10-CM | POA: Diagnosis not present

## 2021-05-17 DIAGNOSIS — H353221 Exudative age-related macular degeneration, left eye, with active choroidal neovascularization: Secondary | ICD-10-CM | POA: Diagnosis not present

## 2021-05-17 DIAGNOSIS — H353211 Exudative age-related macular degeneration, right eye, with active choroidal neovascularization: Secondary | ICD-10-CM | POA: Diagnosis not present

## 2021-06-24 ENCOUNTER — Other Ambulatory Visit: Payer: Self-pay

## 2021-06-24 ENCOUNTER — Ambulatory Visit: Payer: PPO | Admitting: Dermatology

## 2021-06-24 DIAGNOSIS — Z85828 Personal history of other malignant neoplasm of skin: Secondary | ICD-10-CM

## 2021-06-24 DIAGNOSIS — L570 Actinic keratosis: Secondary | ICD-10-CM | POA: Diagnosis not present

## 2021-06-24 DIAGNOSIS — L578 Other skin changes due to chronic exposure to nonionizing radiation: Secondary | ICD-10-CM

## 2021-06-24 DIAGNOSIS — D1800 Hemangioma unspecified site: Secondary | ICD-10-CM

## 2021-06-24 DIAGNOSIS — Z1283 Encounter for screening for malignant neoplasm of skin: Secondary | ICD-10-CM

## 2021-06-24 DIAGNOSIS — L814 Other melanin hyperpigmentation: Secondary | ICD-10-CM

## 2021-06-24 DIAGNOSIS — L821 Other seborrheic keratosis: Secondary | ICD-10-CM

## 2021-06-24 DIAGNOSIS — L820 Inflamed seborrheic keratosis: Secondary | ICD-10-CM | POA: Diagnosis not present

## 2021-06-24 DIAGNOSIS — D229 Melanocytic nevi, unspecified: Secondary | ICD-10-CM

## 2021-06-24 DIAGNOSIS — H353221 Exudative age-related macular degeneration, left eye, with active choroidal neovascularization: Secondary | ICD-10-CM | POA: Diagnosis not present

## 2021-06-24 NOTE — Progress Notes (Signed)
Follow-Up Visit   Subjective  Darren Wright is a 87 y.o. male who presents for the following: Annual Exam (Mole check, hx of BCC, hx of SCC). The patient presents for Upper Body Skin Exam (UBSE) for skin cancer screening and mole check.   The following portions of the chart were reviewed this encounter and updated as appropriate:   Tobacco  Allergies  Meds  Problems  Med Hx  Surg Hx  Fam Hx     Review of Systems:  No other skin or systemic complaints except as noted in HPI or Assessment and Plan.  Objective  Well appearing patient in no apparent distress; mood and affect are within normal limits.  A full examination was performed including scalp, head, eyes, ears, nose, lips, neck, chest, axillae, abdomen, back, buttocks, bilateral upper extremities, bilateral lower extremities, hands, feet, fingers, toes, fingernails, and toenails. All findings within normal limits unless otherwise noted below.  hands, scalp (17) Erythematous thin papules/macules with gritty scale.   left neck Erythematous keratotic or waxy stuck-on papule or plaque.    Assessment & Plan  AK (actinic keratosis) (17) hands, scalp  Actinic keratoses are precancerous spots that appear secondary to cumulative UV radiation exposure/sun exposure over time. They are chronic with expected duration over 1 year. A portion of actinic keratoses will progress to squamous cell carcinoma of the skin. It is not possible to reliably predict which spots will progress to skin cancer and so treatment is recommended to prevent development of skin cancer.  Recommend daily broad spectrum sunscreen SPF 30+ to sun-exposed areas, reapply every 2 hours as needed.  Recommend staying in the shade or wearing long sleeves, sun glasses (UVA+UVB protection) and wide brim hats (4-inch brim around the entire circumference of the hat). Call for new or changing lesions.   Destruction of lesion - hands, scalp Complexity: simple    Destruction method: cryotherapy   Informed consent: discussed and consent obtained   Timeout:  patient name, date of birth, surgical site, and procedure verified Lesion destroyed using liquid nitrogen: Yes   Region frozen until ice ball extended beyond lesion: Yes   Outcome: patient tolerated procedure well with no complications   Post-procedure details: wound care instructions given    Inflamed seborrheic keratosis left neck  Destruction of lesion - left neck Complexity: simple   Destruction method: cryotherapy   Informed consent: discussed and consent obtained   Timeout:  patient name, date of birth, surgical site, and procedure verified Lesion destroyed using liquid nitrogen: Yes   Region frozen until ice ball extended beyond lesion: Yes   Outcome: patient tolerated procedure well with no complications   Post-procedure details: wound care instructions given    Skin cancer screening  Lentigines - Scattered tan macules - Due to sun exposure - Benign-appering, observe - Recommend daily broad spectrum sunscreen SPF 30+ to sun-exposed areas, reapply every 2 hours as needed. - Call for any changes  Seborrheic Keratoses - Stuck-on, waxy, tan-brown papules and/or plaques  - Benign-appearing - Discussed benign etiology and prognosis. - Observe - Call for any changes  Melanocytic Nevi - Tan-brown and/or pink-flesh-colored symmetric macules and papules - Benign appearing on exam today - Observation - Call clinic for new or changing moles - Recommend daily use of broad spectrum spf 30+ sunscreen to sun-exposed areas.   Hemangiomas - Red papules - Discussed benign nature - Observe - Call for any changes  Actinic Damage - Severe, confluent actinic changes with pre-cancerous actinic keratoses  -  Severe, chronic, not at goal, secondary to cumulative UV radiation exposure over time - diffuse scaly erythematous macules and papules with underlying dyspigmentation - Discussed  Prescription "Field Treatment" for Severe, Chronic Confluent Actinic Changes with Pre-Cancerous Actinic Keratoses Start 5FU/Calcipotirne cream apply to scalp twice a day for 7 days  Field treatment involves treatment of an entire area of skin that has confluent Actinic Changes (Sun/ Ultraviolet light damage) and PreCancerous Actinic Keratoses by method of PhotoDynamic Therapy (PDT) and/or prescription Topical Chemotherapy agents such as 5-fluorouracil, 5-fluorouracil/calcipotriene, and/or imiquimod.  The purpose is to decrease the number of clinically evident and subclinical PreCancerous lesions to prevent progression to development of skin cancer by chemically destroying early precancer changes that may or may not be visible.  It has been shown to reduce the risk of developing skin cancer in the treated area. As a result of treatment, redness, scaling, crusting, and open sores may occur during treatment course. One or more than one of these methods may be used and may have to be used several times to control, suppress and eliminate the PreCancerous changes. Discussed treatment course, expected reaction, and possible side effects. - Recommend daily broad spectrum sunscreen SPF 30+ to sun-exposed areas, reapply every 2 hours as needed.  - Staying in the shade or wearing long sleeves, sun glasses (UVA+UVB protection) and wide brim hats (4-inch brim around the entire circumference of the hat) are also recommended. - Call for new or changing lesions.   History of Squamous Cell Carcinoma of the Skin Left crown of scalp  - No evidence of recurrence today - No lymphadenopathy - Recommend regular full body skin exams - Recommend daily broad spectrum sunscreen SPF 30+ to sun-exposed areas, reapply every 2 hours as needed.  - Call if any new or changing lesions are noted between office visits   History of Basal Cell Carcinoma of the Skin  Left medial cheek  - No evidence of recurrence today - Recommend  regular full body skin exams - Recommend daily broad spectrum sunscreen SPF 30+ to sun-exposed areas, reapply every 2 hours as needed.  - Call if any new or changing lesions are noted between office visits  Skin cancer screening performed today.   Return in about 6 months (around 12/25/2021) for Aks .  IMarye Round, CMA, am acting as scribe for Sarina Ser, MD .  Documentation: I have reviewed the above documentation for accuracy and completeness, and I agree with the above.  Sarina Ser, MD

## 2021-06-24 NOTE — Patient Instructions (Addendum)
Cryotherapy Aftercare  Wash gently with soap and water everyday.   Apply Vaseline and Band-Aid daily until healed.     If you have any questions or concerns for your doctor, please call our main line at [phone] and press option 4 to reach your doctor's medical assistant. If no one answers, please leave a voicemail as directed and we will return your call as soon as possible. Messages left after 4 pm will be answered the following business day.   You may also send [place] a message via [place]. We typically respond to MyChart messages within 1-2 business days.  For prescription refills, please ask your pharmacy to contact our office. Our fax number is [phone].  If you have an urgent issue when the clinic is closed that cannot wait until the next business day, you can page your doctor at the number below.    Please note that while we do our best to be available for urgent issues outside of office hours, we are not available 24/7.   If you have an urgent issue and are unable to reach [place], you may choose to seek medical care at your doctor's office, retail clinic, urgent care center, or emergency room.  If you have a medical emergency, please immediately call 911 or go to the emergency department.  Pager Numbers  - Dr. Nehemiah Massed: [phone]  - Dr. Laurence Ferrari: [phone]  - Dr. Nicole Kindred: [phone]  In the event of inclement weather, please call our main line at [phone] for an update on the status of any delays or closures.  Dermatology Medication Tips: Please keep the boxes that topical medications come in in order to help keep track of the instructions about where and how to use these. Pharmacies typically print the medication instructions only on the boxes and not directly on the medication tubes.   If your medication is too expensive, please contact our office at [phone] option 4 or send [place] a message through [place].   We are unable to tell what your co-pay for  medications will be in advance as this is different depending on your insurance coverage. However, we may be able to find a substitute medication at lower cost or fill out paperwork to get insurance to cover a needed medication.   If a prior authorization is required to get your medication covered by your insurance company, please allow [place] 1-2 business days to complete this process.  Drug prices often vary depending on where the prescription is filled and some pharmacies may offer cheaper prices.  The website www.goodrx.com contains coupons for medications through different pharmacies. The prices here do not account for what the cost may be with help from insurance (it may be cheaper with your insurance), but the website can give you the price if you did not use any insurance.  - You can print the associated coupon and take it with your prescription to the pharmacy.  - You may also stop by our office during regular business hours and pick up a GoodRx coupon card.  - If you need your prescription sent electronically to a different pharmacy, notify our office through [facility] or by phone at [phone] option 4.     5-Fluorouracil/Calcipotriene Patient Education  Apply to scalp twice a day x 7 days  Actinic keratoses are the dry, red scaly spots on the skin caused by sun damage. A portion of these spots can turn into skin cancer with time, and treating them can help prevent development of  skin cancer.   Treatment of these spots requires removal of the defective skin cells. There are various ways to remove actinic keratoses, including freezing with liquid nitrogen, treatment with creams, or treatment with a blue light procedure in the office.   5-fluorouracil cream is a topical cream used to treat actinic keratoses. It works by interfering with the growth of abnormal fast-growing skin cells, such as actinic keratoses. These cells peel off and are replaced by healthy ones.    5-fluorouracil/calcipotriene is a combination of the 5-fluorouracil cream with a vitamin D analog cream called calcipotriene. The calcipotriene alone does not treat actinic keratoses. However, when it is combined with 5-fluorouracil, it helps the 5-fluorouracil treat the actinic keratoses much faster so that the same results can be achieved with a much shorter treatment time.  INSTRUCTIONS FOR 5-FLUOROURACIL/CALCIPOTRIENE CREAM:   5-fluorouracil/calcipotriene cream typically only needs to be used for 4-7 days. A thin layer should be applied twice a day to the treatment areas recommended by your physician.   If your physician prescribed you separate tubes of 5-fluourouracil and calcipotriene, apply a thin layer of 5-fluorouracil followed by a thin layer of calcipotriene.   Avoid contact with your eyes, nostrils, and mouth. Do not use 5-fluorouracil/calcipotriene cream on infected or open wounds.   You will develop redness, irritation and some crusting at areas where you have pre-cancer damage/actinic keratoses. IF YOU DEVELOP PAIN, BLEEDING, OR SIGNIFICANT CRUSTING, STOP THE TREATMENT EARLY - you have already gotten a good response and the actinic keratoses should clear up well.  Wash your hands after applying 5-fluorouracil 5% cream on your skin.   A moisturizer or sunscreen with a minimum SPF 30 should be applied each morning.   Once you have finished the treatment, you can apply a thin layer of Vaseline twice a day to irritated areas to soothe and calm the areas more quickly. If you experience significant discomfort, contact your physician.  For some patients it is necessary to repeat the treatment for best results.  SIDE EFFECTS: When using 5-fluorouracil/calcipotriene cream, you may have mild irritation, such as redness, dryness, swelling, or a mild burning sensation. This usually resolves within 2 weeks. The more actinic keratoses you have, the more redness and inflammation you can  expect during treatment. Eye irritation has been reported rarely. If this occurs, please let us know.  If you have any trouble using this cream, please call the office. If you have any other questions about this information, please do not hesitate to ask me before you leave the office.

## 2021-06-25 ENCOUNTER — Encounter: Payer: Self-pay | Admitting: Dermatology

## 2021-07-02 DIAGNOSIS — I10 Essential (primary) hypertension: Secondary | ICD-10-CM | POA: Diagnosis not present

## 2021-07-02 DIAGNOSIS — R001 Bradycardia, unspecified: Secondary | ICD-10-CM | POA: Diagnosis not present

## 2021-07-02 DIAGNOSIS — J431 Panlobular emphysema: Secondary | ICD-10-CM | POA: Diagnosis not present

## 2021-07-02 DIAGNOSIS — I480 Paroxysmal atrial fibrillation: Secondary | ICD-10-CM | POA: Diagnosis not present

## 2021-07-02 DIAGNOSIS — E7800 Pure hypercholesterolemia, unspecified: Secondary | ICD-10-CM | POA: Diagnosis not present

## 2021-07-30 DIAGNOSIS — H353211 Exudative age-related macular degeneration, right eye, with active choroidal neovascularization: Secondary | ICD-10-CM | POA: Diagnosis not present

## 2021-07-30 DIAGNOSIS — H353221 Exudative age-related macular degeneration, left eye, with active choroidal neovascularization: Secondary | ICD-10-CM | POA: Diagnosis not present

## 2021-08-22 DIAGNOSIS — I480 Paroxysmal atrial fibrillation: Secondary | ICD-10-CM | POA: Diagnosis not present

## 2021-08-22 DIAGNOSIS — K219 Gastro-esophageal reflux disease without esophagitis: Secondary | ICD-10-CM | POA: Diagnosis not present

## 2021-08-22 DIAGNOSIS — E7800 Pure hypercholesterolemia, unspecified: Secondary | ICD-10-CM | POA: Diagnosis not present

## 2021-08-22 DIAGNOSIS — Z23 Encounter for immunization: Secondary | ICD-10-CM | POA: Diagnosis not present

## 2021-08-22 DIAGNOSIS — Z79899 Other long term (current) drug therapy: Secondary | ICD-10-CM | POA: Diagnosis not present

## 2021-08-22 DIAGNOSIS — I10 Essential (primary) hypertension: Secondary | ICD-10-CM | POA: Diagnosis not present

## 2021-08-22 DIAGNOSIS — E538 Deficiency of other specified B group vitamins: Secondary | ICD-10-CM | POA: Diagnosis not present

## 2021-08-30 ENCOUNTER — Ambulatory Visit: Payer: PPO | Attending: Internal Medicine

## 2021-08-30 ENCOUNTER — Other Ambulatory Visit: Payer: Self-pay

## 2021-08-30 DIAGNOSIS — Z23 Encounter for immunization: Secondary | ICD-10-CM

## 2021-08-30 MED ORDER — PFIZER COVID-19 VAC BIVALENT 30 MCG/0.3ML IM SUSP
INTRAMUSCULAR | 0 refills | Status: DC
  Filled 2021-08-30: qty 0.3, 30d supply, fill #0

## 2021-08-30 NOTE — Progress Notes (Signed)
   Covid-19 Vaccination Clinic  Name:  Darren Wright    MRN: [id number] DOB: Aug 24, 1929  08/30/2021  [name] was observed post Covid-19 immunization for 15 minutes without incident. He was provided with Vaccine Information Sheet and instruction to access the V-Safe system.   [name] was instructed to call 911 with any severe reactions post vaccine: Difficulty breathing  Swelling of face and throat  A fast heartbeat  A bad rash all over body  Dizziness and weakness   Lu Duffel, PharmD, MBA Clinical Acute Care Pharmacist

## 2021-09-09 DIAGNOSIS — H353221 Exudative age-related macular degeneration, left eye, with active choroidal neovascularization: Secondary | ICD-10-CM | POA: Diagnosis not present

## 2021-10-02 ENCOUNTER — Other Ambulatory Visit: Payer: Self-pay | Admitting: Urology

## 2021-10-14 ENCOUNTER — Other Ambulatory Visit: Payer: Self-pay

## 2021-10-14 ENCOUNTER — Encounter: Payer: Self-pay | Admitting: Urology

## 2021-10-14 ENCOUNTER — Ambulatory Visit: Payer: PPO | Admitting: Urology

## 2021-10-14 VITALS — BP 156/81 | HR 71 | Ht 71.0 in | Wt 168.0 lb

## 2021-10-14 DIAGNOSIS — Z8551 Personal history of malignant neoplasm of bladder: Secondary | ICD-10-CM | POA: Diagnosis not present

## 2021-10-14 DIAGNOSIS — N3001 Acute cystitis with hematuria: Secondary | ICD-10-CM | POA: Diagnosis not present

## 2021-10-14 LAB — MICROSCOPIC EXAMINATION: Bacteria, UA: NONE SEEN

## 2021-10-14 LAB — URINALYSIS, COMPLETE
Bilirubin, UA: NEGATIVE
Glucose, UA: NEGATIVE
Ketones, UA: NEGATIVE
Leukocytes,UA: NEGATIVE
Nitrite, UA: NEGATIVE
Protein,UA: NEGATIVE
RBC, UA: NEGATIVE
Specific Gravity, UA: 1.015 (ref 1.005–1.030)
Urobilinogen, Ur: 0.2 mg/dL (ref 0.2–1.0)
pH, UA: 6 (ref 5.0–7.5)

## 2021-10-14 NOTE — Progress Notes (Signed)
   10/14/21  CC:  Chief Complaint  Patient presents with   Cysto    Urologic history: TURBT 07/17/2020; >5 cm papillary tumor Pathology low-grade Ta urothelial carcinoma Completed 6-week course intravesical gemcitabine October 2021 for intermediate risk disease  HPI: Darren Wright has no complaints today.  Denies dysuria or gross hematuria  Blood pressure (!) 156/81, pulse 71, height 5\' 11"  (1.803 m), weight 168 lb (76.2 kg). NED. A&Ox3.   No respiratory distress   Abd soft, NT, ND Normal phallus with bilateral descended testicles  Cystoscopy Procedure Note  Patient identification was confirmed, informed consent was obtained, and patient was prepped using Betadine solution.  Lidocaine jelly was administered per urethral meatus.     Pre-Procedure: - Inspection reveals a normal caliber urethral meatus.  Procedure: The flexible cystoscope was introduced without difficulty - No urethral strictures/lesions are present. -  Moderate lateral lobe enlargement  prostate with hypervascularity -  Large median lobe   - Bilateral ureteral orifices identified - Bladder mucosa  reveals no ulcers, tumors, or lesions - No bladder stones - Moderate trabeculation  Retroflexion shows large, intravesical median lobe   Post-Procedure: - Patient tolerated the procedure well  Assessment/ Plan: No recurrent bladder tumor identified Marked BPH Follow-up surveillance cystoscopy 1 year   Darren Sons, MD

## 2021-10-29 DIAGNOSIS — H353221 Exudative age-related macular degeneration, left eye, with active choroidal neovascularization: Secondary | ICD-10-CM | POA: Diagnosis not present

## 2021-10-29 DIAGNOSIS — H353211 Exudative age-related macular degeneration, right eye, with active choroidal neovascularization: Secondary | ICD-10-CM | POA: Diagnosis not present

## 2021-11-04 DIAGNOSIS — H40002 Preglaucoma, unspecified, left eye: Secondary | ICD-10-CM | POA: Diagnosis not present

## 2021-11-04 DIAGNOSIS — H401111 Primary open-angle glaucoma, right eye, mild stage: Secondary | ICD-10-CM | POA: Diagnosis not present

## 2021-11-05 DIAGNOSIS — I10 Essential (primary) hypertension: Secondary | ICD-10-CM | POA: Diagnosis not present

## 2021-12-09 DIAGNOSIS — E538 Deficiency of other specified B group vitamins: Secondary | ICD-10-CM | POA: Diagnosis not present

## 2021-12-09 DIAGNOSIS — J431 Panlobular emphysema: Secondary | ICD-10-CM | POA: Diagnosis not present

## 2021-12-09 DIAGNOSIS — E7800 Pure hypercholesterolemia, unspecified: Secondary | ICD-10-CM | POA: Diagnosis not present

## 2021-12-09 DIAGNOSIS — I10 Essential (primary) hypertension: Secondary | ICD-10-CM | POA: Diagnosis not present

## 2021-12-09 DIAGNOSIS — Z79899 Other long term (current) drug therapy: Secondary | ICD-10-CM | POA: Diagnosis not present

## 2021-12-09 DIAGNOSIS — I480 Paroxysmal atrial fibrillation: Secondary | ICD-10-CM | POA: Diagnosis not present

## 2021-12-17 DIAGNOSIS — H353221 Exudative age-related macular degeneration, left eye, with active choroidal neovascularization: Secondary | ICD-10-CM | POA: Diagnosis not present

## 2021-12-30 ENCOUNTER — Encounter: Payer: Self-pay | Admitting: Dermatology

## 2021-12-30 ENCOUNTER — Other Ambulatory Visit: Payer: Self-pay

## 2021-12-30 ENCOUNTER — Ambulatory Visit: Payer: PPO | Admitting: Dermatology

## 2021-12-30 DIAGNOSIS — L308 Other specified dermatitis: Secondary | ICD-10-CM | POA: Diagnosis not present

## 2021-12-30 DIAGNOSIS — L578 Other skin changes due to chronic exposure to nonionizing radiation: Secondary | ICD-10-CM

## 2021-12-30 DIAGNOSIS — L570 Actinic keratosis: Secondary | ICD-10-CM

## 2021-12-30 MED ORDER — MOMETASONE FUROATE 0.1 % EX CREA: TOPICAL_CREAM | CUTANEOUS | 2 refills | Status: DC

## 2021-12-30 NOTE — Progress Notes (Signed)
Follow-Up Visit   Subjective  Darren Wright is a 88 y.o. male who presents for the following: Actinic Keratoses (6 month recheck. Tx with LN2. S/P 5FU/Calcipotriene treatment since last visit on scalp. C/O spot on nose to check today ). The patient has spots, moles and lesions to be evaluated, some may be new or changing and the patient has concerns that these could be cancer. He also has a rash on his legs he would like evaluated today.  Tends to be itchy and symptomatic.  The following portions of the chart were reviewed this encounter and updated as appropriate:  Tobacco   Allergies   Meds   Problems   Med Hx   Surg Hx   Fam Hx      Review of Systems: No other skin or systemic complaints except as noted in HPI or Assessment and Plan.  Objective  Well appearing patient in no apparent distress; mood and affect are within normal limits.  A focused examination was performed including head, including the scalp, face, neck, nose, ears, eyelids, and lips. Relevant physical exam findings are noted in the Assessment and Plan.  Scalp, nose, face x7 (7) Erythematous thin papules/macules with gritty scale.   B/L legs Scaly erythematous patches    Assessment & Plan  AK (actinic keratosis) (7) Scalp, nose, face x7  Actinic keratoses are precancerous spots that appear secondary to cumulative UV radiation exposure/sun exposure over time. They are chronic with expected duration over 1 year. A portion of actinic keratoses will progress to squamous cell carcinoma of the skin. It is not possible to reliably predict which spots will progress to skin cancer and so treatment is recommended to prevent development of skin cancer.  Recommend daily broad spectrum sunscreen SPF 30+ to sun-exposed areas, reapply every 2 hours as needed.  Recommend staying in the shade or wearing long sleeves, sun glasses (UVA+UVB protection) and wide brim hats (4-inch brim around the entire circumference of the hat). Call  for new or changing lesions.  Destruction of lesion - Scalp, nose, face x7 Complexity: simple   Destruction method: cryotherapy   Informed consent: discussed and consent obtained   Timeout:  patient name, date of birth, surgical site, and procedure verified Lesion destroyed using liquid nitrogen: Yes   Region frozen until ice ball extended beyond lesion: Yes   Outcome: patient tolerated procedure well with no complications   Post-procedure details: wound care instructions given    Eczema B/L legs Moisturize with CeraVe cream twice daily every day. Mometasone cream qd-bid 5 days a week prn.   Topical steroids (such as triamcinolone, fluocinolone, fluocinonide, mometasone, clobetasol, halobetasol, betamethasone, hydrocortisone) can cause thinning and lightening of the skin if they are used for too long in the same area. Your physician has selected the right strength medicine for your problem and area affected on the body. Please use your medication only as directed by your physician to prevent side effects.   mometasone (ELOCON) 0.1 % cream - B/L legs Apply once or twice daily to affected areas on legs 5 days a week as needed for rash/itching  Actinic Damage - chronic, secondary to cumulative UV radiation exposure/sun exposure over time - diffuse scaly erythematous macules with underlying dyspigmentation - Recommend daily broad spectrum sunscreen SPF 30+ to sun-exposed areas, reapply every 2 hours as needed.  - Recommend staying in the shade or wearing long sleeves, sun glasses (UVA+UVB protection) and wide brim hats (4-inch brim around the entire circumference of the  hat). - Call for new or changing lesions.  Return in about 1 year (around 12/30/2022) for TBSE.  I, Emelia Salisbury, CMA, am acting as scribe for Sarina Ser, MD. Documentation: I have reviewed the above documentation for accuracy and completeness, and I agree with the above.  Sarina Ser, MD

## 2021-12-30 NOTE — Patient Instructions (Addendum)
Cryotherapy Aftercare  Wash gently with soap and water everyday.   Apply Vaseline and Band-Aid daily until healed.   Prior to procedure, discussed risks of blister formation, small wound, skin dyspigmentation, or rare scar following cryotherapy. Recommend Vaseline ointment to treated areas while healing.    Mometasone cream once or twice daily 5 days a week as needed only for rash/itching.  Topical steroids (such as triamcinolone, fluocinolone, fluocinonide, mometasone, clobetasol, halobetasol, betamethasone, hydrocortisone) can cause thinning and lightening of the skin if they are used for too long in the same area. Your physician has selected the right strength medicine for your problem and area affected on the body. Please use your medication only as directed by your physician to prevent side effects.   Gentle Skin Care Guide  1. Bathe no more than once a day.  2. Avoid bathing in hot water  3. Use a mild soap like Dove, Vanicream, Cetaphil, CeraVe. Can use Lever 2000 or Cetaphil antibacterial soap  4. Use soap only where you need it. On most days, use it under your arms, between your legs, and on your feet. Let the water rinse other areas unless visibly dirty.  5. When you get out of the bath/shower, use a towel to gently blot your skin dry, don't rub it.  6. While your skin is still a little damp, apply a moisturizing cream such as Vanicream, CeraVe, Cetaphil, Eucerin, Sarna lotion or plain Vaseline Jelly. For hands apply Neutrogena [place] Hand Cream or Excipial Hand Cream.  7. Reapply moisturizer any time you start to itch or feel dry.  8. Sometimes using free and clear laundry detergents can be helpful. Fabric softener sheets should be avoided. Downy Free & Gentle liquid, or any liquid fabric softener that is free of dyes and perfumes, it acceptable to use  9. If your doctor has given you prescription creams you may apply moisturizers over them   If You Need Anything After  Your Visit  If you have any questions or concerns for your doctor, please call our main line at [phone] and press option 4 to reach your doctor's medical assistant. If no one answers, please leave a voicemail as directed and we will return your call as soon as possible. Messages left after 4 pm will be answered the following business day.   You may also send [place] a message via [place]. We typically respond to MyChart messages within 1-2 business days.  For prescription refills, please ask your pharmacy to contact our office. Our fax number is [phone].  If you have an urgent issue when the clinic is closed that cannot wait until the next business day, you can page your doctor at the number below.    Please note that while we do our best to be available for urgent issues outside of office hours, we are not available 24/7.   If you have an urgent issue and are unable to reach [place], you may choose to seek medical care at your doctor's office, retail clinic, urgent care center, or emergency room.  If you have a medical emergency, please immediately call 911 or go to the emergency department.  Pager Numbers  - Dr. Nehemiah Massed: [phone]  - Dr. Laurence Ferrari: [phone]  - Dr. Nicole Kindred: [phone]  In the event of inclement weather, please call our main line at [phone] for an update on the status of any delays or closures.  Dermatology Medication Tips: Please keep the boxes that topical medications come in in order to help  keep track of the instructions about where and how to use these. Pharmacies typically print the medication instructions only on the boxes and not directly on the medication tubes.   If your medication is too expensive, please contact our office at [phone] option 4 or send [place] a message through [place].   We are unable to tell what your co-pay for medications will be in advance as this is different depending on your insurance coverage. However, we may be able to  find a substitute medication at lower cost or fill out paperwork to get insurance to cover a needed medication.   If a prior authorization is required to get your medication covered by your insurance company, please allow [place] 1-2 business days to complete this process.  Drug prices often vary depending on where the prescription is filled and some pharmacies may offer cheaper prices.  The website www.goodrx.com contains coupons for medications through different pharmacies. The prices here do not account for what the cost may be with help from insurance (it may be cheaper with your insurance), but the website can give you the price if you did not use any insurance.  - You can print the associated coupon and take it with your prescription to the pharmacy.  - You may also stop by our office during regular business hours and pick up a GoodRx coupon card.  - If you need your prescription sent electronically to a different pharmacy, notify our office through [facility] or by phone at [phone] option 4.     Si Usted Necesita Algo Despus de Su Visita  Tambin puede enviarnos un mensaje a travs de Pharmacist, community. Por lo general respondemos a los mensajes de MyChart en el transcurso de 1 a 2 das hbiles.  Para renovar recetas, por favor pida a su farmacia que se ponga en contacto con nuestra oficina. Harland Dingwall de fax es [place] [phone].  Si tiene un asunto urgente cuando la clnica est cerrada y que no puede esperar hasta el siguiente da hbil, puede llamar/localizar a su doctor(a) al nmero que aparece a continuacin.   Por favor, tenga en cuenta que aunque hacemos todo lo posible para estar disponibles para asuntos urgentes fuera del horario de [place], no estamos disponibles las 24 horas del da, los 7 das de la [place].   Si tiene un problema urgente y no puede comunicarse con nosotros, puede optar por buscar atencin mdica  en el consultorio de su doctor(a), en una clnica privada,  en un centro de atencin urgente o en una sala de emergencias.  Si tiene Engineering geologist, por favor llame inmediatamente al 911 o vaya a la sala de emergencias.  Nmeros de bper  - Dr. Nehemiah Massed: [phone]  - Dra. Moye: [phone]  - Dra. Nicole Kindred: [phone]  En caso de inclemencias del [place], por favor llame a Johnsie Kindred principal al [phone] para una actualizacin sobre el [place] de cualquier retraso o cierre.  Consejos para la medicacin en dermatologa: Por favor, guarde las cajas en las que vienen los medicamentos de uso tpico para ayudarle a seguir las instrucciones sobre dnde y cmo usarlos. Las farmacias generalmente imprimen las instrucciones del medicamento slo en las cajas y no directamente en los tubos del [place].   Si su medicamento es muy caro, por favor, pngase en contacto con Zigmund Daniel llamando al [phone] y presione la opcin 4 o envenos un mensaje a travs de Pharmacist, community.   No podemos decirle cul ser su copago por los  medicamentos por adelantado ya que esto es diferente dependiendo de la cobertura de su seguro. Sin embargo, es posible que podamos encontrar un medicamento sustituto a Electrical engineer un formulario para que el seguro cubra el medicamento que se considera necesario.   Si se requiere una autorizacin previa para que su compaa de seguros [place] su medicamento, por favor permtanos de 1 a 2 das hbiles para completar este proceso.  Los precios de los medicamentos varan con frecuencia dependiendo del Environmental consultant de dnde se surte la receta y alguna farmacias pueden ofrecer precios ms baratos.  El sitio web www.goodrx.com tiene cupones para medicamentos de Airline pilot. Los precios aqu no tienen en cuenta lo que podra costar con la ayuda del seguro (puede ser ms barato con su seguro), pero el sitio web puede darle el precio si no utiliz Research scientist (physical sciences).  - Puede imprimir el cupn correspondiente y llevarlo con su  receta a la farmacia.  - Tambin puede pasar por nuestra oficina durante el horario de atencin regular y Charity fundraiser una tarjeta de cupones de GoodRx.  - Si necesita que su receta se enve electrnicamente a una farmacia diferente, informe a nuestra oficina a travs de MyChart de [place] o por telfono llamando al [phone] y presione la opcin 4.

## 2021-12-31 ENCOUNTER — Encounter: Payer: Self-pay | Admitting: Dermatology

## 2022-01-02 DIAGNOSIS — R001 Bradycardia, unspecified: Secondary | ICD-10-CM | POA: Diagnosis not present

## 2022-01-02 DIAGNOSIS — I10 Essential (primary) hypertension: Secondary | ICD-10-CM | POA: Diagnosis not present

## 2022-01-02 DIAGNOSIS — I480 Paroxysmal atrial fibrillation: Secondary | ICD-10-CM | POA: Diagnosis not present

## 2022-01-02 DIAGNOSIS — E7800 Pure hypercholesterolemia, unspecified: Secondary | ICD-10-CM | POA: Diagnosis not present

## 2022-01-02 DIAGNOSIS — J431 Panlobular emphysema: Secondary | ICD-10-CM | POA: Diagnosis not present

## 2022-02-04 DIAGNOSIS — H353221 Exudative age-related macular degeneration, left eye, with active choroidal neovascularization: Secondary | ICD-10-CM | POA: Diagnosis not present

## 2022-02-13 DIAGNOSIS — Z79899 Other long term (current) drug therapy: Secondary | ICD-10-CM | POA: Diagnosis not present

## 2022-02-13 DIAGNOSIS — I10 Essential (primary) hypertension: Secondary | ICD-10-CM | POA: Diagnosis not present

## 2022-02-13 DIAGNOSIS — E7800 Pure hypercholesterolemia, unspecified: Secondary | ICD-10-CM | POA: Diagnosis not present

## 2022-02-20 DIAGNOSIS — Z79899 Other long term (current) drug therapy: Secondary | ICD-10-CM | POA: Diagnosis not present

## 2022-02-20 DIAGNOSIS — I480 Paroxysmal atrial fibrillation: Secondary | ICD-10-CM | POA: Diagnosis not present

## 2022-02-20 DIAGNOSIS — E782 Mixed hyperlipidemia: Secondary | ICD-10-CM | POA: Diagnosis not present

## 2022-02-20 DIAGNOSIS — I10 Essential (primary) hypertension: Secondary | ICD-10-CM | POA: Diagnosis not present

## 2022-02-20 DIAGNOSIS — E538 Deficiency of other specified B group vitamins: Secondary | ICD-10-CM | POA: Diagnosis not present

## 2022-02-20 DIAGNOSIS — Z0000 Encounter for general adult medical examination without abnormal findings: Secondary | ICD-10-CM | POA: Diagnosis not present

## 2022-02-20 DIAGNOSIS — J431 Panlobular emphysema: Secondary | ICD-10-CM | POA: Diagnosis not present

## 2022-02-20 DIAGNOSIS — Z1211 Encounter for screening for malignant neoplasm of colon: Secondary | ICD-10-CM | POA: Diagnosis not present

## 2022-02-24 ENCOUNTER — Encounter: Payer: Self-pay | Admitting: Oncology

## 2022-02-24 ENCOUNTER — Other Ambulatory Visit: Payer: Self-pay

## 2022-02-24 ENCOUNTER — Telehealth: Payer: Self-pay

## 2022-02-24 MED ORDER — PAXLOVID (300/100) 20 X 150 MG & 10 X 100MG PO TBPK
3.0000 | ORAL_TABLET | Freq: Two times a day (BID) | ORAL | 0 refills | Status: DC
  Filled 2022-02-24: qty 30, 5d supply, fill #0

## 2022-02-24 NOTE — Telephone Encounter (Signed)
Outpatient Pharmacy Oral COVID Treatment Note ? ?I connected with Javier Glazier on 02/24/2022/12:02 PM by telephone and verified that I am speaking with the correct person using two identifiers.  I discussed the limitations, risks, security, and privacy concerns of performing an evaluation and management service by telephone and the availability of in person appointments via referral to a physician. The patient expressed understanding and agreed to proceed. ? ?Pharmacy location: [place] ? ?Diagnosis: COVID-19 infection ? ?Purpose of visit: Discussion of potential use of Paxlovid, a new treatment for mild to moderate COVID-19 viral infection in non-hospitalized patients. ? ?Subjective/Objective: Patient is a 88 y.o. male who is presenting with COVID 19 viral infection.  COVID 19 viral infection. Their symptoms began on 02/23/22 with sore throat and nasal congestion.  The patient has confirmed COVID-19 via a home test on 02/24/22. ? ? ?Past Medical History:  ?Diagnosis Date  ? A-fib ([place])   ? Basal cell carcinoma 12/11/2016  ? left medial cheek  ? GERD (gastroesophageal reflux disease)   ? Hypertension   ? Squamous cell carcinoma of skin 03/10/2018  ? left crown scalp  ? ? ? ?Allergies  ?Allergen Reactions  ? Ibuprofen Hives  ? Penicillins   ?  Swelling 1 week after having injection. Unsure if it was really the cause.  ? ? ? ?Current Outpatient Medications:  ?  benazepril-hydrochlorthiazide (LOTENSIN HCT) 20-12.5 MG tablet, Take 1 tablet by mouth daily. , Disp: , Rfl:  ?  COVID-19 mRNA bivalent vaccine, Pfizer, (PFIZER COVID-19 VAC BIVALENT) injection, Inject into the muscle., Disp: 0.3 mL, Rfl: 0 ?  COVID-19 mRNA Vac-TriS, Pfizer, (PFIZER-BIONT COVID-19 VAC-TRIS) SUSP injection, Inject into the muscle., Disp: 0.3 mL, Rfl: 0 ?  cyanocobalamin 1000 MCG tablet, Take 1,000 mcg by mouth daily. , Disp: , Rfl:  ?  dabigatran (PRADAXA) 150 MG CAPS capsule, Take 150 mg by mouth 2 (two) times daily. ,  Disp: , Rfl:  ?  levofloxacin (LEVAQUIN) 500 MG tablet, Take 1 tablet (500 mg total) by mouth daily., Disp: 7 tablet, Rfl: 0 ?  mometasone (ELOCON) 0.1 % cream, Apply once or twice daily to affected areas on legs 5 days a week as needed for rash/itching, Disp: 45 g, Rfl: 2 ?  Multiple Vitamins-Minerals (PRESERVISION AREDS 2 PO), Take 1 capsule by mouth in the morning and at bedtime. , Disp: , Rfl:  ?  nirmatrelvir & ritonavir (PAXLOVID, 300/100,) 20 x 150 MG & 10 x 100MG TBPK, Take 3 tablets by mouth 2 (two) times daily., Disp: 30 tablet, Rfl: 0 ?  pantoprazole (PROTONIX) 40 MG tablet, Take 40 mg by mouth daily. , Disp: , Rfl:  ?  tamsulosin (FLOMAX) 0.4 MG CAPS capsule, Take 0.4 mg by mouth at bedtime. , Disp: , Rfl:  ?  timolol (TIMOPTIC) 0.5 % ophthalmic solution, Place 1 drop into the right eye daily., Disp: , Rfl:  ?  Vibegron (GEMTESA) 75 MG TABS, Take 75 mg by mouth daily., Disp: 28 tablet, Rfl: 0 ? ?Lab Monitoring: eGFR 70 ? ?Drug Interactions Noted: Tamsulosin - advised patient to hold and monitor BP. ?Pradaxa - advised pt to watch for signs of bleeding. ? ?Plan: ? ?This patient is a 88 y.o. male that meets the criteria for Emergency Use Authorization of Paxlovid. After reviewing the emergency use authorization with the patient, the patient agrees to receive Paxlovid. ? ?Through FDA guidance and current [place] standing order Paxlovid will be prescribed to the patient.  ? ?Patient  contacted for counseling on 02/24/22 and verbalized understanding.  ? ?Delivery or Pick-Up Date: 02/24/22 ? ?Follow up instructions:  ?  ?Take prescription BID x 5 days as directed ?Counseling was provided by pharmacist. Reach out to pharmacist with follow up questions ?For concerns regarding further COVID symptoms please follow up with your PCP or urgent care ?For urgent or life-threatening issues, seek care at your local emergency department ? ? ?Brugger,Pamela J ?02/24/2022, 12:02 PM ?[place]  Pharmacist ?Phone# [phone]  ?

## 2022-03-11 DIAGNOSIS — Z1211 Encounter for screening for malignant neoplasm of colon: Secondary | ICD-10-CM | POA: Diagnosis not present

## 2022-04-01 DIAGNOSIS — H353221 Exudative age-related macular degeneration, left eye, with active choroidal neovascularization: Secondary | ICD-10-CM | POA: Diagnosis not present

## 2022-05-08 DIAGNOSIS — H40002 Preglaucoma, unspecified, left eye: Secondary | ICD-10-CM | POA: Diagnosis not present

## 2022-05-08 DIAGNOSIS — H401111 Primary open-angle glaucoma, right eye, mild stage: Secondary | ICD-10-CM | POA: Diagnosis not present

## 2022-05-17 ENCOUNTER — Other Ambulatory Visit: Payer: Self-pay | Admitting: Nurse Practitioner

## 2022-05-27 DIAGNOSIS — H353221 Exudative age-related macular degeneration, left eye, with active choroidal neovascularization: Secondary | ICD-10-CM | POA: Diagnosis not present

## 2022-05-27 DIAGNOSIS — H353231 Exudative age-related macular degeneration, bilateral, with active choroidal neovascularization: Secondary | ICD-10-CM | POA: Diagnosis not present

## 2022-05-27 DIAGNOSIS — H353211 Exudative age-related macular degeneration, right eye, with active choroidal neovascularization: Secondary | ICD-10-CM | POA: Diagnosis not present

## 2022-07-09 ENCOUNTER — Ambulatory Visit: Payer: PPO | Admitting: Dermatology

## 2022-07-09 DIAGNOSIS — L821 Other seborrheic keratosis: Secondary | ICD-10-CM | POA: Diagnosis not present

## 2022-07-09 DIAGNOSIS — L820 Inflamed seborrheic keratosis: Secondary | ICD-10-CM | POA: Diagnosis not present

## 2022-07-09 DIAGNOSIS — L578 Other skin changes due to chronic exposure to nonionizing radiation: Secondary | ICD-10-CM

## 2022-07-09 DIAGNOSIS — L570 Actinic keratosis: Secondary | ICD-10-CM

## 2022-07-09 NOTE — Patient Instructions (Addendum)

## 2022-07-09 NOTE — Progress Notes (Signed)
Follow-Up Visit   Subjective  Darren Wright is a 88 y.o. male who presents for the following: Rough spot (Nose, has been treated with cryotherapy in the past, but patient still feels something there. Similar on right hand dorsum, not treated. Was itchy at first. Is irritated.).   The following portions of the chart were reviewed this encounter and updated as appropriate:       Review of Systems:  No other skin or systemic complaints except as noted in HPI or Assessment and Plan.  Objective  Well appearing patient in no apparent distress; mood and affect are within normal limits.  A focused examination was performed including face, right hand. Relevant physical exam findings are noted in the Assessment and Plan.  L nasal tip x 1 (residual), R frontal scalp x 1 (2) scaly pink macule  Right Dorsal Hand Erythematous stuck-on, waxy papule or plaque    Assessment & Plan  Actinic Damage - chronic, secondary to cumulative UV radiation exposure/sun exposure over time - diffuse scaly erythematous macules with underlying dyspigmentation - Recommend daily broad spectrum sunscreen SPF 30+ to sun-exposed areas, reapply every 2 hours as needed.  - Recommend staying in the shade or wearing long sleeves, sun glasses (UVA+UVB protection) and wide brim hats (4-inch brim around the entire circumference of the hat). - Call for new or changing lesions.  Seborrheic Keratoses - Stuck-on, waxy, tan-brown papules and/or plaques  - Benign-appearing - Discussed benign etiology and prognosis. - Observe - Call for any changes  AK (actinic keratosis) (2) L nasal tip x 1 (residual), R frontal scalp x 1  Actinic keratoses are precancerous spots that appear secondary to cumulative UV radiation exposure/sun exposure over time. They are chronic with expected duration over 1 year. A portion of actinic keratoses will progress to squamous cell carcinoma of the skin. It is not possible to reliably predict  which spots will progress to skin cancer and so treatment is recommended to prevent development of skin cancer.  Recommend daily broad spectrum sunscreen SPF 30+ to sun-exposed areas, reapply every 2 hours as needed.  Recommend staying in the shade or wearing long sleeves, sun glasses (UVA+UVB protection) and wide brim hats (4-inch brim around the entire circumference of the hat). Call for new or changing lesions.  Destruction of lesion - L nasal tip x 1 (residual), R frontal scalp x 1  Destruction method: cryotherapy   Informed consent: discussed and consent obtained   Lesion destroyed using liquid nitrogen: Yes   Region frozen until ice ball extended beyond lesion: Yes   Outcome: patient tolerated procedure well with no complications   Post-procedure details: wound care instructions given   Additional details:  Prior to procedure, discussed risks of blister formation, small wound, skin dyspigmentation, or rare scar following cryotherapy. Recommend Vaseline ointment to treated areas while healing.   Inflamed seborrheic keratosis Right Dorsal Hand  Symptomatic, irritating, patient would like treated.  Destruction of lesion - Right Dorsal Hand  Destruction method: cryotherapy   Informed consent: discussed and consent obtained   Lesion destroyed using liquid nitrogen: Yes   Region frozen until ice ball extended beyond lesion: Yes   Outcome: patient tolerated procedure well with no complications   Post-procedure details: wound care instructions given   Additional details:  Prior to procedure, discussed risks of blister formation, small wound, skin dyspigmentation, or rare scar following cryotherapy. Recommend Vaseline ointment to treated areas while healing.    Return as scheduled with Dr Raliegh Ip, for  TBSE.  I, Jamesetta Orleans, CMA, am acting as scribe for Brendolyn Patty, MD .  Documentation: I have reviewed the above documentation for accuracy and completeness, and I agree with the  above.  Brendolyn Patty MD

## 2022-07-14 DIAGNOSIS — E782 Mixed hyperlipidemia: Secondary | ICD-10-CM | POA: Diagnosis not present

## 2022-07-14 DIAGNOSIS — I10 Essential (primary) hypertension: Secondary | ICD-10-CM | POA: Diagnosis not present

## 2022-07-14 DIAGNOSIS — I480 Paroxysmal atrial fibrillation: Secondary | ICD-10-CM | POA: Diagnosis not present

## 2022-08-05 DIAGNOSIS — H353211 Exudative age-related macular degeneration, right eye, with active choroidal neovascularization: Secondary | ICD-10-CM | POA: Diagnosis not present

## 2022-08-05 DIAGNOSIS — H353221 Exudative age-related macular degeneration, left eye, with active choroidal neovascularization: Secondary | ICD-10-CM | POA: Diagnosis not present

## 2022-08-15 DIAGNOSIS — Z79899 Other long term (current) drug therapy: Secondary | ICD-10-CM | POA: Diagnosis not present

## 2022-08-15 DIAGNOSIS — E538 Deficiency of other specified B group vitamins: Secondary | ICD-10-CM | POA: Diagnosis not present

## 2022-08-15 DIAGNOSIS — E782 Mixed hyperlipidemia: Secondary | ICD-10-CM | POA: Diagnosis not present

## 2022-08-15 DIAGNOSIS — I10 Essential (primary) hypertension: Secondary | ICD-10-CM | POA: Diagnosis not present

## 2022-08-22 DIAGNOSIS — E538 Deficiency of other specified B group vitamins: Secondary | ICD-10-CM | POA: Diagnosis not present

## 2022-08-22 DIAGNOSIS — I10 Essential (primary) hypertension: Secondary | ICD-10-CM | POA: Diagnosis not present

## 2022-08-22 DIAGNOSIS — I480 Paroxysmal atrial fibrillation: Secondary | ICD-10-CM | POA: Diagnosis not present

## 2022-08-22 DIAGNOSIS — J431 Panlobular emphysema: Secondary | ICD-10-CM | POA: Diagnosis not present

## 2022-08-22 DIAGNOSIS — Z23 Encounter for immunization: Secondary | ICD-10-CM | POA: Diagnosis not present

## 2022-08-22 DIAGNOSIS — Z79899 Other long term (current) drug therapy: Secondary | ICD-10-CM | POA: Diagnosis not present

## 2022-08-22 DIAGNOSIS — E782 Mixed hyperlipidemia: Secondary | ICD-10-CM | POA: Diagnosis not present

## 2022-09-30 ENCOUNTER — Other Ambulatory Visit: Payer: Self-pay

## 2022-09-30 MED ORDER — COVID-19 MRNA 2023-2024 VACCINE (COMIRNATY) 0.3 ML INJECTION
0.3000 mL | Freq: Once | INTRAMUSCULAR | 0 refills | Status: AC
  Filled 2022-09-30: qty 0.3, 1d supply, fill #0

## 2022-10-01 ENCOUNTER — Other Ambulatory Visit: Payer: Self-pay

## 2022-10-16 ENCOUNTER — Other Ambulatory Visit: Payer: PPO | Admitting: Urology

## 2022-10-17 ENCOUNTER — Ambulatory Visit: Payer: PPO | Admitting: Urology

## 2022-10-17 ENCOUNTER — Encounter: Payer: Self-pay | Admitting: Urology

## 2022-10-17 VITALS — BP 151/88 | HR 76 | Ht 72.0 in | Wt 168.0 lb

## 2022-10-17 DIAGNOSIS — Z8551 Personal history of malignant neoplasm of bladder: Secondary | ICD-10-CM | POA: Diagnosis not present

## 2022-10-17 DIAGNOSIS — N400 Enlarged prostate without lower urinary tract symptoms: Secondary | ICD-10-CM

## 2022-10-17 DIAGNOSIS — N3001 Acute cystitis with hematuria: Secondary | ICD-10-CM | POA: Diagnosis not present

## 2022-10-17 LAB — MICROSCOPIC EXAMINATION

## 2022-10-17 LAB — URINALYSIS, COMPLETE
Bilirubin, UA: NEGATIVE
Glucose, UA: NEGATIVE
Ketones, UA: NEGATIVE
Leukocytes,UA: NEGATIVE
Nitrite, UA: NEGATIVE
Protein,UA: NEGATIVE
RBC, UA: NEGATIVE
Specific Gravity, UA: 1.02 (ref 1.005–1.030)
Urobilinogen, Ur: 0.2 mg/dL (ref 0.2–1.0)
pH, UA: 5 (ref 5.0–7.5)

## 2022-10-17 NOTE — Progress Notes (Signed)
   10/17/22  CC:  Chief Complaint  Patient presents with   Cysto    Urologic history: TURBT 07/17/2020; >5 cm papillary tumor Pathology low-grade Ta urothelial carcinoma Completed 6-week course intravesical gemcitabine October 2021 for intermediate risk disease  HPI: Mr. Anna has no complaints today.  Denies dysuria or gross hematuria  Blood pressure (!) 156/81, pulse 71, height '5\' 11"'$  (1.803 m), weight 168 lb (76.2 kg).   Cystoscopy Procedure Note  Patient identification was confirmed, informed consent was obtained, and patient was prepped using Betadine solution.  Lidocaine jelly was administered per urethral meatus.     Pre-Procedure: - Inspection reveals a normal caliber urethral meatus.  Procedure: The flexible cystoscope was introduced without difficulty - No urethral strictures/lesions are present. - Coapting lateral lobes -  Large median lobe   - Bilateral ureteral orifices identified - Bladder mucosa  reveals no ulcers, tumors, or lesions - No bladder stones - Moderate trabeculation  Retroflexion shows large, intravesical median lobe   Post-Procedure: - Patient tolerated the procedure well  Assessment/ Plan: No recurrent bladder tumor identified Marked BPH Follow-up surveillance cystoscopy 1 year   Abbie Sons, MD

## 2022-10-28 DIAGNOSIS — H353221 Exudative age-related macular degeneration, left eye, with active choroidal neovascularization: Secondary | ICD-10-CM | POA: Diagnosis not present

## 2022-11-07 DIAGNOSIS — H401111 Primary open-angle glaucoma, right eye, mild stage: Secondary | ICD-10-CM | POA: Diagnosis not present

## 2022-12-31 ENCOUNTER — Ambulatory Visit: Payer: PPO | Admitting: Dermatology

## 2023-01-14 DIAGNOSIS — I480 Paroxysmal atrial fibrillation: Secondary | ICD-10-CM | POA: Diagnosis not present

## 2023-01-14 DIAGNOSIS — J431 Panlobular emphysema: Secondary | ICD-10-CM | POA: Diagnosis not present

## 2023-01-14 DIAGNOSIS — I10 Essential (primary) hypertension: Secondary | ICD-10-CM | POA: Diagnosis not present

## 2023-01-14 DIAGNOSIS — E782 Mixed hyperlipidemia: Secondary | ICD-10-CM | POA: Diagnosis not present

## 2023-01-26 DIAGNOSIS — H18513 Endothelial corneal dystrophy, bilateral: Secondary | ICD-10-CM | POA: Diagnosis not present

## 2023-01-26 DIAGNOSIS — H353231 Exudative age-related macular degeneration, bilateral, with active choroidal neovascularization: Secondary | ICD-10-CM | POA: Diagnosis not present

## 2023-01-26 DIAGNOSIS — Z961 Presence of intraocular lens: Secondary | ICD-10-CM | POA: Diagnosis not present

## 2023-01-26 DIAGNOSIS — H353211 Exudative age-related macular degeneration, right eye, with active choroidal neovascularization: Secondary | ICD-10-CM | POA: Diagnosis not present

## 2023-01-26 DIAGNOSIS — H353221 Exudative age-related macular degeneration, left eye, with active choroidal neovascularization: Secondary | ICD-10-CM | POA: Diagnosis not present

## 2023-02-03 ENCOUNTER — Ambulatory Visit: Payer: Self-pay | Admitting: Surgery

## 2023-02-03 DIAGNOSIS — K4020 Bilateral inguinal hernia, without obstruction or gangrene, not specified as recurrent: Secondary | ICD-10-CM | POA: Diagnosis not present

## 2023-02-03 NOTE — H&P (Signed)
Subjective:   CC: Non-recurrent bilateral inguinal hernia without obstruction or gangrene [K40.20]  HPI:  Darren Wright is a 89 y.o. male who returns  for evaluation of above.   Left side increasing in size and more symptomatic now, finished treatement for urothelial CA, will like to proceed with hernia repair.   Past Medical History:  has a past medical history of Allergic state, Angioedema, BPH (benign prostatic hyperplasia), COPD (chronic obstructive pulmonary disease) (CMS-HCC), DDD (degenerative disc disease), cervical, GERD (gastroesophageal reflux disease), History of colon polyps, Hyperlipidemia, Hypertension, Macular degeneration, Osteoarthritis, and Peptic ulcer.  Past Surgical History:  Past Surgical History:  Procedure Laterality Date   COLONOSCOPY  2007   10/03/2014 Per: Dorene Sorrow, MD at 10/02/2014 2:57 PM Pt refused repeat colonoscopy in the past. So, no need to send recall letter.anw   NEUROPLASTY VOCAL CORD     removal of polyps   TONSILLECTOMY      Family History: family history includes Breast cancer in an other family member; Cancer in an other family member; Pancreatic cancer in his mother; Prostate cancer in his father.  Social History:  reports that he quit smoking about 38 years ago. His smoking use included cigarettes. He has never used smokeless tobacco. He reports that he does not drink alcohol and does not use drugs.  Current Medications: has a current medication list which includes the following prescription(s): cyanocobalamin, losartan-hydrochlorothiazide, pantoprazole, pradaxa, tamsulosin, timolol maleate, vit c/e/zn/coppr/lutein/zeaxan, and sildenafil.  Allergies:  Allergies as of 02/03/2023 - Reviewed 02/03/2023  Allergen Reaction Noted   Ibuprofen Hives 05/16/2014   Penicillins Unknown 04/07/2014    ROS:  A 15 point review of systems was performed and pertinent positives and negatives noted in HPI   Objective:     BP (!) 174/91   Pulse  70   Ht 172.7 cm (5' 7.99")   Wt 74.9 kg (165 lb 2 oz)   BMI 25.11 kg/m   Constitutional :  Alert, cooperative, no distress  Lymphatics/Throat:  Supple, no lymphadenopathy  Respiratory:  clear to auscultation bilaterally  Cardiovascular:  regular rate and rhythm  Gastrointestinal: soft, non-tender; bowel sounds normal; no masses,  no organomegaly. inguinal hernia noted.  moderate, reducible, no overlying skin changes, and bilateral L>R  Musculoskeletal: Steady gait and movement  Skin: Cool and moist  Psychiatric: Normal affect, non-agitated, not confused       LABS:  N/a   RADS: N/a Assessment:       Non-recurrent bilateral inguinal hernia without obstruction or gangrene [K40.20]  Plan:     1. Non-recurrent bilateral inguinal hernia without obstruction or gangrene [K40.20]   Discussed the risk of surgery including recurrence, which can be up to 50% in the case of incisional or complex hernias, possible use of prosthetic materials (mesh) and the increased risk of mesh infxn if used, bleeding, chronic pain, post-op infxn, post-op SBO or ileus, and possible re-operation to address said risks. The risks of general anesthetic, if used, includes MI, CVA, sudden death or even reaction to anesthetic medications also discussed. Alternatives include continued observation.  Benefits include possible symptom relief, prevention of incarceration, strangulation, enlargement in size over time, and the risk of emergency surgery in the face of strangulation.   Typical post-op recovery time of 3-5 days with 2 weeks of activity restrictions were also discussed.  ED return precautions given for sudden increase in pain, size of hernia with accompanying fever, nausea, and/or vomiting.  The patient verbalized understanding and all questions were  answered to the patient's satisfaction.   2. Patient has elected to proceed with surgical treatment. Procedure will be scheduled. bilateral, robotic assisted  laparoscopic  Hold anticoag per cards  labs/images/medications/previous chart entries reviewed personally and relevant changes/updates noted above.

## 2023-02-03 NOTE — H&P (View-Only) (Signed)
Subjective:   CC: Non-recurrent bilateral inguinal hernia without obstruction or gangrene [K40.20]  HPI:  Darren Wright is a 89 y.o. male who returns  for evaluation of above.   Left side increasing in size and more symptomatic now, finished treatement for urothelial CA, will like to proceed with hernia repair.   Past Medical History:  has a past medical history of Allergic state, Angioedema, BPH (benign prostatic hyperplasia), COPD (chronic obstructive pulmonary disease) (CMS-HCC), DDD (degenerative disc disease), cervical, GERD (gastroesophageal reflux disease), History of colon polyps, Hyperlipidemia, Hypertension, Macular degeneration, Osteoarthritis, and Peptic ulcer.  Past Surgical History:  Past Surgical History:  Procedure Laterality Date   COLONOSCOPY  2007   10/03/2014 Per: Paul Youngbok Oh, MD at 10/02/2014 2:57 PM Pt refused repeat colonoscopy in the past. So, no need to send recall letter.anw   NEUROPLASTY VOCAL CORD     removal of polyps   TONSILLECTOMY      Family History: family history includes Breast cancer in an other family member; Cancer in an other family member; Pancreatic cancer in his mother; Prostate cancer in his father.  Social History:  reports that he quit smoking about 38 years ago. His smoking use included cigarettes. He has never used smokeless tobacco. He reports that he does not drink alcohol and does not use drugs.  Current Medications: has a current medication list which includes the following prescription(s): cyanocobalamin, losartan-hydrochlorothiazide, pantoprazole, pradaxa, tamsulosin, timolol maleate, vit c/e/zn/coppr/lutein/zeaxan, and sildenafil.  Allergies:  Allergies as of 02/03/2023 - Reviewed 02/03/2023  Allergen Reaction Noted   Ibuprofen Hives 05/16/2014   Penicillins Unknown 04/07/2014    ROS:  A 15 point review of systems was performed and pertinent positives and negatives noted in HPI   Objective:     BP (!) 174/91   Pulse  70   Ht 172.7 cm (5' 7.99")   Wt 74.9 kg (165 lb 2 oz)   BMI 25.11 kg/m   Constitutional :  Alert, cooperative, no distress  Lymphatics/Throat:  Supple, no lymphadenopathy  Respiratory:  clear to auscultation bilaterally  Cardiovascular:  regular rate and rhythm  Gastrointestinal: soft, non-tender; bowel sounds normal; no masses,  no organomegaly. inguinal hernia noted.  moderate, reducible, no overlying skin changes, and bilateral L>R  Musculoskeletal: Steady gait and movement  Skin: Cool and moist  Psychiatric: Normal affect, non-agitated, not confused       LABS:  N/a   RADS: N/a Assessment:       Non-recurrent bilateral inguinal hernia without obstruction or gangrene [K40.20]  Plan:     1. Non-recurrent bilateral inguinal hernia without obstruction or gangrene [K40.20]   Discussed the risk of surgery including recurrence, which can be up to 50% in the case of incisional or complex hernias, possible use of prosthetic materials (mesh) and the increased risk of mesh infxn if used, bleeding, chronic pain, post-op infxn, post-op SBO or ileus, and possible re-operation to address said risks. The risks of general anesthetic, if used, includes MI, CVA, sudden death or even reaction to anesthetic medications also discussed. Alternatives include continued observation.  Benefits include possible symptom relief, prevention of incarceration, strangulation, enlargement in size over time, and the risk of emergency surgery in the face of strangulation.   Typical post-op recovery time of 3-5 days with 2 weeks of activity restrictions were also discussed.  ED return precautions given for sudden increase in pain, size of hernia with accompanying fever, nausea, and/or vomiting.  The patient verbalized understanding and all questions were   answered to the patient's satisfaction.   2. Patient has elected to proceed with surgical treatment. Procedure will be scheduled. bilateral, robotic assisted  laparoscopic  Hold anticoag per cards  labs/images/medications/previous chart entries reviewed personally and relevant changes/updates noted above.   

## 2023-02-05 ENCOUNTER — Encounter
Admission: RE | Admit: 2023-02-05 | Discharge: 2023-02-05 | Disposition: A | Discharge status: Home / Self Care | Payer: PPO | Source: Ambulatory Visit | Attending: Surgery | Admitting: Surgery

## 2023-02-05 ENCOUNTER — Other Ambulatory Visit: Payer: Self-pay

## 2023-02-05 VITALS — Ht 71.0 in | Wt 165.0 lb

## 2023-02-05 DIAGNOSIS — I480 Paroxysmal atrial fibrillation: Secondary | ICD-10-CM

## 2023-02-05 DIAGNOSIS — Z85828 Personal history of other malignant neoplasm of skin: Secondary | ICD-10-CM

## 2023-02-05 DIAGNOSIS — I10 Essential (primary) hypertension: Secondary | ICD-10-CM

## 2023-02-05 HISTORY — DX: Long term (current) use of antithrombotics/antiplatelets: Z79.02

## 2023-02-05 NOTE — Patient Instructions (Addendum)
Your procedure is scheduled on: Thursday February 12, 2023. Report to the Registration Desk on the 1st floor of the [facility]. To find out your arrival time, please call [phone] between 1PM - 3PM on: Wednesday February 11, 2023. If your arrival time is 6:00 am, do not arrive before that time as the [facility] entrance doors do not open until 6:00 am.  REMEMBER: Instructions that are not followed completely may result in serious medical risk, up to and including death; or upon the discretion of your surgeon and anesthesiologist your surgery may need to be rescheduled.  Do not eat food after midnight the night before surgery.  No gum chewing or hard candies.  You may however, drink CLEAR liquids up to 2 hours before you are scheduled to arrive for your surgery. Do not drink anything within 2 hours of your scheduled arrival time.  Clear liquids include: - water  - apple juice without pulp - gatorade (not RED colors) - black coffee or tea (Do NOT add milk or creamers to the coffee or tea) Do NOT drink anything that is not on this list.   One week prior to surgery: Stop Anti-inflammatories (NSAIDS) such as Advil, Aleve, Ibuprofen, Motrin, Naproxen, Naprosyn and Aspirin based products such as Excedrin, Goody's Powder, BC Powder. Stop ANY OVER THE COUNTER supplements until after surgery. You may however, continue to take Tylenol if needed for pain up until the day of surgery.  Continue taking all prescribed medications with the exception of the following:    Call your doctor and follow recommendations from Cardiologist or PCP regarding stopping blood thinners. dabigatran (PRADAXA) 150 MG   TAKE ONLY THESE MEDICATIONS THE MORNING OF SURGERY WITH A SIP OF WATER:   pantoprazole (PROTONIX) 40 MG take one the night before surgery and one dose morning of surgery  timolol (TIMOPTIC) 0.5 % ophthalmic solution    No Alcohol for 24 hours before or after surgery.  No Smoking including  e-cigarettes for 24 hours before surgery.  No chewable tobacco products for at least 6 hours before surgery.  No nicotine patches on the day of surgery.  Do not use any "recreational" drugs for at least a week (preferably 2 weeks) before your surgery.  Please be advised that the combination of cocaine and anesthesia may have negative outcomes, up to and including death. If you test positive for cocaine, your surgery will be cancelled.  On the morning of surgery brush your teeth with toothpaste and water, you may rinse your mouth with mouthwash if you wish. Do not swallow any toothpaste or mouthwash.  Use CHG Soap or wipes as directed on instruction sheet.  Do not wear jewelry, make-up, hairpins, clips or nail polish.  Do not wear lotions, powders, or perfumes.   Do not shave body hair from the neck down 48 hours before surgery.  Contact lenses, hearing aids and dentures may not be worn into surgery.  Do not bring valuables to the hospital. [facility] is not responsible for any missing/lost belongings or valuables.    Notify your doctor if there is any change in your medical condition (cold, fever, infection).  Wear comfortable clothing (specific to your surgery type) to the hospital.  After surgery, you can help prevent lung complications by doing breathing exercises.  Take deep breaths and cough every 1-2 hours. Your doctor may order a device called an Incentive Spirometer to help you take deep breaths. When coughing or sneezing, hold a pillow firmly against your  incision with both hands. This is called "splinting." Doing this helps protect your incision. It also decreases belly discomfort.  If you are being admitted to the hospital overnight, leave your suitcase in the car. After surgery it may be brought to your room.  In case of increased patient census, it may be necessary for you, the patient, to continue your postoperative care in the Same Day Surgery department.  If you  are being discharged the day of surgery, you will not be allowed to drive home. You will need a responsible individual to drive you home and stay with you for 24 hours after surgery.   If you are taking public transportation, you will need to have a responsible individual with you.  Please call the [facility] Dept. at [phone] if you have any questions about these instructions.  Surgery Visitation Policy:  Patients undergoing a surgery or procedure may have two family members or support persons with them as long as the person is not COVID-19 positive or experiencing its symptoms.   Inpatient Visitation:    Visiting hours are 7 a.m. to 8 p.m. Up to four visitors are allowed at one time in a patient room. The visitors may rotate out with other people during the day. One designated support person (adult) may remain overnight.  Due to an increase in RSV and influenza rates and associated hospitalizations, children ages 13 and under will not be able to visit patients in [facility]. Masks continue to be strongly recommended.     Preparing for Surgery with CHLORHEXIDINE GLUCONATE (CHG) Soap  Chlorhexidine Gluconate (CHG) Soap  o An antiseptic cleaner that kills germs and bonds with the skin to continue killing germs even after washing  o Used for showering the night before surgery and morning of surgery  Before surgery, you can play an important role by reducing the number of germs on your skin.  CHG (Chlorhexidine gluconate) soap is an antiseptic cleanser which kills germs and bonds with the skin to continue killing germs even after washing.  Please do not use if you have an allergy to CHG or antibacterial soaps. If your skin becomes reddened/irritated stop using the CHG.  1. Shower the NIGHT BEFORE SURGERY and the MORNING OF SURGERY with CHG soap.  2. If you choose to wash your hair, wash your hair first as usual with your normal shampoo.  3. After  shampooing, rinse your hair and body thoroughly to remove the shampoo.  4. Use CHG as you would any other liquid soap. You can apply CHG directly to the skin and wash gently with a scrungie or a clean washcloth.  5. Apply the CHG soap to your body only from the neck down. Do not use on open wounds or open sores. Avoid contact with your eyes, ears, mouth, and genitals (private parts). Wash face and genitals (private parts) with your normal soap.  6. Wash thoroughly, paying special attention to the area where your surgery will be performed.  7. Thoroughly rinse your body with warm water.  8. Do not shower/wash with your normal soap after using and rinsing off the CHG soap.  9. Pat yourself dry with a clean towel.  10. Wear clean pajamas to bed the night before surgery.  12. Place clean sheets on your bed the night of your first shower and do not sleep with pets.  13. Shower again with the CHG soap on the day of surgery prior to arriving at the hospital.  14. Do not apply any deodorants/lotions/powders.  15. Please wear clean clothes to the hospital.

## 2023-02-06 ENCOUNTER — Encounter
Admission: RE | Admit: 2023-02-06 | Discharge: 2023-02-06 | Disposition: A | Discharge status: Home / Self Care | Payer: PPO | Source: Ambulatory Visit | Attending: Surgery | Admitting: Surgery

## 2023-02-06 DIAGNOSIS — Z01810 Encounter for preprocedural cardiovascular examination: Secondary | ICD-10-CM | POA: Diagnosis not present

## 2023-02-06 DIAGNOSIS — Z01818 Encounter for other preprocedural examination: Secondary | ICD-10-CM | POA: Diagnosis not present

## 2023-02-06 DIAGNOSIS — I10 Essential (primary) hypertension: Secondary | ICD-10-CM | POA: Insufficient documentation

## 2023-02-06 DIAGNOSIS — I480 Paroxysmal atrial fibrillation: Secondary | ICD-10-CM | POA: Diagnosis not present

## 2023-02-06 DIAGNOSIS — R9431 Abnormal electrocardiogram [ECG] [EKG]: Secondary | ICD-10-CM | POA: Insufficient documentation

## 2023-02-06 LAB — BASIC METABOLIC PANEL
Anion gap: 9 (ref 5–15)
BUN: 20 mg/dL (ref 8–23)
CO2: 25 mmol/L (ref 22–32)
Calcium: 9.1 mg/dL (ref 8.9–10.3)
Chloride: 102 mmol/L (ref 98–111)
Creatinine, Ser: 0.82 mg/dL (ref 0.61–1.24)
GFR, Estimated: >60 mL/min (ref >60)
Glucose, Bld: 100 mg/dL — ABNORMAL HIGH (ref 70–99)
Potassium: 4 mmol/L (ref 3.5–5.1)
Sodium: 136 mmol/L (ref 135–145)

## 2023-02-06 LAB — CBC
HCT: 41.9 % (ref 39.0–52.0)
Hemoglobin: 12.8 g/dL — ABNORMAL LOW (ref 13.0–17.0)
MCH: 24.6 pg — ABNORMAL LOW (ref 26.0–34.0)
MCHC: 30.5 g/dL (ref 30.0–36.0)
MCV: 80.4 fL (ref 80.0–100.0)
Platelets: 258 10*3/uL (ref 150–400)
RBC: 5.21 MIL/uL (ref 4.22–5.81)
RDW: 16.3 % — ABNORMAL HIGH (ref 11.5–15.5)
WBC: 5.3 10*3/uL (ref 4.0–10.5)
nRBC: 0 % (ref 0.0–0.2)

## 2023-02-09 ENCOUNTER — Encounter: Payer: Self-pay | Admitting: Surgery

## 2023-02-09 NOTE — Progress Notes (Signed)
Perioperative / Anesthesia Services  Pre-Admission Testing Clinical Review / Preoperative Anesthesia Consult  Date: 02/10/23  Patient Demographics:  Name: Darren Wright DOB:   1929-11-07 MRN:   [id number]  Planned Surgical Procedure(s):    Case: [id number] Date/Time: 02/12/23 0715   Procedure: XI ROBOTIC ASSISTED INGUINAL HERNIA (Bilateral: Inguinal)   Anesthesia type: General   Pre-op diagnosis: non-recurrent bil inguinal hernia w/o obstruction or gangrene K40.20   Location: [facility] OR ROOM 06 / [facility]   Surgeons: Benjamine Sprague, DO     NOTE: Available PAT nursing documentation and vital signs have been reviewed. Clinical nursing staff has updated patient's PMH/PSHx, current medication list, and drug allergies/intolerances to ensure comprehensive history available to assist in medical decision making as it pertains to the aforementioned surgical procedure and anticipated anesthetic course. Extensive review of available clinical information personally performed. [place] PMH and PSHx updated with any diagnoses/procedures that  may have been inadvertently omitted during his intake with the pre-admission testing department's nursing staff.  Clinical Discussion:  Darren Wright is a 89 y.o. male who is submitted for pre-surgical anesthesia review and clearance prior to him undergoing the above procedure. Patient is a Former Smoker (quit 12/1983). Pertinent PMH includes: PAF, aortic atherosclerosis, HTN, HLD, COPD, GERD (on daily PPI), hiatal hernia, PUD, inguinal hernia, BPH, OA, cervical DDD.  Patient is followed by cardiology Saralyn Pilar, MD). He was last seen in the cardiology clinic on 01/14/2023; notes reviewed. At the time of his clinic visit, patient doing well overall from a cardiovascular perspective. Patient denied any chest pain, shortness of breath, PND, orthopnea, palpitations, significant peripheral edema, weakness, fatigue, vertiginous symptoms, or  presyncope/syncope. Patient with a past medical history significant for cardiovascular diagnoses. Documented physical exam was grossly benign, providing no evidence of acute exacerbation and/or decompensation of the patient's known cardiovascular conditions.  Stress echocardiogram was performed on 04/13/2014 revealing a normal left ventricular systolic function with an EF of >55%.  MPHR of 138 bpm was achieved.  Patient's maximum stress heart rate was 187 bpm; remained asymptomatic. Patient able to achieve 4.60 METS of activity during the study.  There were no valvular abnormalities noted.  Study determined to be normal and low risk.  Patient with an atrial fibrillation diagnosis; CHA2DS2-VASc Score = 4 (age x 2, HTN, vascular disease history). His rate and rhythm are currently being maintained on oral metoprolol succinate. He is chronically anticoagulated using dabigatran; reported to be compliant with therapy with no evidence or reports of GI bleeding.  Blood pressure well controlled at 124/80 mmHg on currently prescribed ACEi (benazepril), diuretic (HCTZ) and beta-blocker (metoprolol succinate) therapies.  Patient not currently taking any type of lipid-lowering therapies for his HLD diagnosis and ASCVD prevention.  He is not diabetic. Patient does not have an OSAH diagnosis. No changes were made to his medication regimen during his visit with cardiology.  Patient scheduled to follow-up with outpatient cardiology in 6 months or sooner if needed.  Darren Wright is scheduled for an elective XI ROBOTIC ASSISTED INGUINAL HERNIA (Bilateral: Inguinal) on 02/12/2023 with Dr. Benjamine Sprague, DO. Given patient's past medical history significant for cardiovascular diagnoses, presurgical cardiac clearance was sought by the PAT team. Per cardiology, "this patient is optimized for surgery and may proceed with the planned procedural course with a LOW risk of significant perioperative cardiovascular complications".     Again, this patient is on chronic antithrombotic therapy.  He has been instructed on recommendations from his cardiologist for holding  his dabigatran dose for 2 days prior to his procedure with plans to restart as soon as postoperative bleeding risk felt to be minimized by his primary attending surgeon.  Patient is aware that his last dose of dabigatran should be on 02/09/2023.  Patient denies previous perioperative complications with anesthesia in the past. In review of the available records, it is noted that patient underwent a general anesthetic course here at [facility] (ASA III) in 07/2020 without documented complications.      02/05/2023   11:46 AM 10/17/2022   11:07 AM 10/14/2021   11:23 AM  Vitals with BMI  Height '5\' 11"'$  '6\' 0"'$  '5\' 11"'$   Weight 165 lbs 168 lbs 168 lbs  BMI 23.02 [id number]  Systolic  [id number]  Diastolic  88 81  Pulse  76 71    Providers/Specialists:   NOTE: Primary physician provider listed below. Patient may have been seen by APP or partner within same practice.   PROVIDER ROLE / SPECIALTY LAST Shirline Frees, DO General Surgery (Surgeon) 02/03/2023  Idelle Crouch, MD Primary Care Provider [phone]  Isaias Cowman, MD Cardiology 01/14/2023   Allergies:  Ibuprofen and Penicillins  Current Home Medications:   No current facility-administered medications for this encounter.    benazepril-hydrochlorthiazide (LOTENSIN HCT) 20-12.5 MG tablet   cyanocobalamin 1000 MCG tablet   dabigatran (PRADAXA) 150 MG CAPS capsule   metoprolol succinate (TOPROL-XL) 25 MG 24 hr tablet   mometasone (ELOCON) 0.1 % cream   Multiple Vitamins-Minerals (PRESERVISION AREDS 2 PO)   pantoprazole (PROTONIX) 40 MG tablet   tamsulosin (FLOMAX) 0.4 MG CAPS capsule   timolol (TIMOPTIC) 0.5 % ophthalmic solution   Vibegron (GEMTESA) 75 MG TABS   History:   Past Medical History:  Diagnosis Date   Aortic atherosclerosis ([place])    Basal  cell carcinoma 12/11/2016   left medial cheek   Bladder cancer ([place]) 2021   BPH (benign prostatic hyperplasia)    Colon polyp    COPD (chronic obstructive pulmonary disease) (HCC)    DDD (degenerative disc disease), cervical    Diverticulosis    GERD (gastroesophageal reflux disease)    Hepatic cyst    Hiatal hernia    History of angioedema    HLD (hyperlipidemia)    Hypertension    Inguinal hernia    Long term current use of antithrombotics/antiplatelets    a.) dabigatran   Macular degeneration    Osteoarthritis    PAF (paroxysmal atrial fibrillation) ([place])    a.) CHA2DS2VASc = 4 (age x2, HTN, vascular disease history);  b.) rate/rhythm maintained on oral metoprolol succinate; chronically anticoagulated with dabigatran   PUD (peptic ulcer disease)    Squamous cell carcinoma of skin 03/10/2018   left crown scalp   Past Surgical History:  Procedure Laterality Date   CHOLECYSTECTOMY  2018   CYSTOSCOPY W/ RETROGRADES Bilateral 07/17/2020   Procedure: CYSTOSCOPY WITH RETROGRADE PYELOGRAM;  Surgeon: Abbie Sons, MD;  Location: [facility] ORS;  Service: Urology;  Laterality: Bilateral;   TONSILLECTOMY     TRANSURETHRAL RESECTION OF BLADDER TUMOR WITH MITOMYCIN-C N/A 07/17/2020   Procedure: TRANSURETHRAL RESECTION OF BLADDER TUMOR WITH gemcitabine;  Surgeon: Abbie Sons, MD;  Location: [facility] ORS;  Service: Urology;  Laterality: N/A;   Family History  Problem Relation Age of Onset   Pancreatic cancer Mother    Prostate cancer Father    Breast cancer Sister    Bladder Cancer Sister  Social History   Tobacco Use   Smoking status: Former    Types: Cigarettes    Quit date: 1985    Years since quitting: 39.2   Smokeless tobacco: Never  Vaping Use   Vaping Use: Never used  Substance Use Topics   Alcohol use: Not Currently   Drug use: Never    Pertinent Clinical Results:  LABS:   No visits with results within 3 Day(s) from this visit.  Latest known visit with results  is:  Hospital Outpatient Visit on 02/06/2023  Component Date Value Ref Range Status   WBC 02/06/2023 5.3  4.0 - 10.5 K/uL Final   RBC 02/06/2023 5.21  4.22 - 5.81 MIL/uL Final   Hemoglobin 02/06/2023 12.8 (L)  13.0 - 17.0 g/dL Final   HCT 02/06/2023 41.9  39.0 - 52.0 % Final   MCV 02/06/2023 80.4  80.0 - 100.0 fL Final   MCH 02/06/2023 24.6 (L)  26.0 - 34.0 pg Final   MCHC 02/06/2023 30.5  30.0 - 36.0 g/dL Final   RDW 02/06/2023 16.3 (H)  11.5 - 15.5 % Final   Platelets 02/06/2023 258  150 - 400 K/uL Final   nRBC 02/06/2023 0.0  0.0 - 0.2 % Final   Performed at [facility], [place], [place] [address]   Sodium 02/06/2023 136  135 - 145 mmol/L Final   Potassium 02/06/2023 4.0  3.5 - 5.1 mmol/L Final   Chloride 02/06/2023 102  98 - 111 mmol/L Final   CO2 02/06/2023 25  22 - 32 mmol/L Final   Glucose, Bld 02/06/2023 100 (H)  70 - 99 mg/dL Final   Glucose reference range applies only to samples taken after fasting for at least 8 hours.   BUN 02/06/2023 20  8 - 23 mg/dL Final   Creatinine, Ser 02/06/2023 0.82  0.61 - 1.24 mg/dL Final   Calcium 02/06/2023 9.1  8.9 - 10.3 mg/dL Final   GFR, Estimated 02/06/2023 >60  >60 mL/min Final   Comment: (NOTE) Calculated using the CKD-EPI Creatinine Equation (2021)    Anion gap 02/06/2023 9  5 - 15 Final   Performed at [facility], [address] [place], [place] [address]    ECG: Date: 02/06/2023 Time ECG obtained: 0851 AM Rate: 91 bpm Rhythm: normal sinus Axis (leads I and aVF): Normal Intervals: PR 164 ms. QRS 98 ms. QTc 442 ms. ST segment and T wave changes: Nonspecific ST abnormality Comparison: Similar to previous tracing obtained on 06/10/2020   IMAGING / PROCEDURES: CT ABDOMEN PELVIS W CONTRAST performed on 06/11/2020 Evidence of Acute Diverticulitis of the mid sigmoid colon. Mild secondary involvement of adjacent small bowel but no abscess or other complicating features. Recommend Urology  follow-up for indeterminate soft tissue thickening at the right base of the bladder and enlarged and heterogeneously enhancing prostate. Moderate size gastric hiatal hernia.  Fat containing right inguinal hernia, partially containing some of the sigmoid mesentery Benign appearing hepatic cysts.  Aortic atherosclerosis  STRESS ECHOCARDIOGRAM performed on 04/13/2014 Normal left ventricular systolic function with an EF of >55% MPHR achieved; max stress heart rate was 187 bpm; achieved 4.60 METS No significant valvular abnormalities noted  Impression and Plan:  Javier Glazier has been referred for pre-anesthesia review and clearance prior to him undergoing the planned anesthetic and procedural courses. Available labs, pertinent testing, and imaging results were personally reviewed by me in preparation for upcoming operative/procedural course. [facility] medical record has been updated following extensive  record review and patient interview with PAT staff.   This patient has been appropriately cleared by cardiology with an overall LOW risk of significant perioperative cardiovascular complications. Based on clinical review performed today (02/10/23), barring any significant acute changes in the patient's overall condition, it is anticipated that he will be able to proceed with the planned surgical intervention. Any acute changes in clinical condition may necessitate his procedure being postponed and/or cancelled. Patient will meet with anesthesia team (MD and/or CRNA) on the day of his procedure for preoperative evaluation/assessment. Questions regarding anesthetic course will be fielded at that time.   Pre-surgical instructions were reviewed with the patient during his PAT appointment, and questions were fielded to satisfaction by PAT clinical staff. He has been instructed on which medications that he will need to hold prior to surgery, as well as the ones that have been deemed safe/appropriate to take  of the day of his procedure. As part of the general education provided by PAT, patient made aware both verbally and in writing, that he would need to abstain from the use of any illegal substances during his perioperative course.  He was advised that failure to follow the provided instructions could necessitate case cancellation or result serious perioperative complications up to and including death. Patient encouraged to contact PAT and/or his surgeon's office to discuss any questions or concerns that may arise prior to surgery; verbalized understanding.   Honor Loh, MSN, APRN, FNP-C, CEN [facility]  Peri-operative Services Nurse Practitioner Phone: [phone] Fax: [phone] 02/10/23 12:07 PM  NOTE: This note has been prepared using Dragon dictation software. Despite my best ability to proofread, there is always the potential that unintentional transcriptional errors may still occur from this process.

## 2023-02-11 MED ORDER — ORAL CARE MOUTH RINSE: 15.0000 mL | Freq: Once | OROMUCOSAL | Status: AC

## 2023-02-11 MED ORDER — LACTATED RINGERS IV SOLN: INTRAVENOUS | Status: DC

## 2023-02-11 MED ORDER — GABAPENTIN 300 MG PO CAPS
300.0000 mg | ORAL_CAPSULE | ORAL | Status: AC
  Administered 2023-02-12: 300 mg via ORAL

## 2023-02-11 MED ORDER — CHLORHEXIDINE GLUCONATE 0.12 % MT SOLN: 15.0000 mL | Freq: Once | OROMUCOSAL | Status: AC

## 2023-02-11 MED ORDER — ACETAMINOPHEN 500 MG PO TABS: 1000.0000 mg | ORAL_TABLET | ORAL | Status: AC

## 2023-02-11 MED ORDER — CHLORHEXIDINE GLUCONATE CLOTH 2 % EX PADS
6.0000 | MEDICATED_PAD | Freq: Once | CUTANEOUS | Status: AC
  Administered 2023-02-12: 6 via TOPICAL

## 2023-02-11 MED ORDER — CEFAZOLIN SODIUM-DEXTROSE 2-4 GM/100ML-% IV SOLN
2.0000 g | INTRAVENOUS | Status: AC
  Administered 2023-02-12: 2 g via INTRAVENOUS

## 2023-02-12 ENCOUNTER — Ambulatory Visit: Payer: PPO | Admitting: Urgent Care

## 2023-02-12 ENCOUNTER — Ambulatory Visit
Admission: RE | Admit: 2023-02-12 | Discharge: 2023-02-12 | Disposition: A | Discharge status: Home / Self Care | Payer: PPO | Attending: Surgery | Admitting: Surgery

## 2023-02-12 ENCOUNTER — Other Ambulatory Visit: Payer: Self-pay

## 2023-02-12 ENCOUNTER — Encounter: Payer: Self-pay | Admitting: Surgery

## 2023-02-12 ENCOUNTER — Encounter
Admission: RE | Disposition: A | Discharge status: Home / Self Care | Payer: Self-pay | Source: Home / Self Care | Attending: Surgery

## 2023-02-12 DIAGNOSIS — K219 Gastro-esophageal reflux disease without esophagitis: Secondary | ICD-10-CM | POA: Diagnosis not present

## 2023-02-12 DIAGNOSIS — Z87891 Personal history of nicotine dependence: Secondary | ICD-10-CM | POA: Insufficient documentation

## 2023-02-12 DIAGNOSIS — I10 Essential (primary) hypertension: Secondary | ICD-10-CM | POA: Insufficient documentation

## 2023-02-12 DIAGNOSIS — K4020 Bilateral inguinal hernia, without obstruction or gangrene, not specified as recurrent: Secondary | ICD-10-CM

## 2023-02-12 DIAGNOSIS — I4891 Unspecified atrial fibrillation: Secondary | ICD-10-CM | POA: Diagnosis not present

## 2023-02-12 HISTORY — DX: Polyp of colon: K63.5

## 2023-02-12 HISTORY — DX: Peptic ulcer, site unspecified, unspecified as acute or chronic, without hemorrhage or perforation: K27.9

## 2023-02-12 HISTORY — PX: OTHER SURGICAL HISTORY: SHX169

## 2023-02-12 HISTORY — DX: Chronic obstructive pulmonary disease, unspecified: J44.9

## 2023-02-12 HISTORY — DX: Diaphragmatic hernia without obstruction or gangrene: K44.9

## 2023-02-12 HISTORY — DX: Unilateral inguinal hernia, without obstruction or gangrene, not specified as recurrent: K40.90

## 2023-02-12 HISTORY — DX: Other cervical disc degeneration, unspecified cervical region: M50.30

## 2023-02-12 HISTORY — DX: Hyperlipidemia, unspecified: E78.5

## 2023-02-12 HISTORY — DX: Unspecified osteoarthritis, unspecified site: M19.90

## 2023-02-12 HISTORY — DX: Diverticulosis of intestine, part unspecified, without perforation or abscess without bleeding: K57.90

## 2023-02-12 HISTORY — DX: Unspecified macular degeneration: H35.30

## 2023-02-12 HISTORY — DX: Other specified diseases of liver: K76.89

## 2023-02-12 HISTORY — DX: Personal history of other specified conditions: Z87.898

## 2023-02-12 HISTORY — DX: Paroxysmal atrial fibrillation: I48.0

## 2023-02-12 HISTORY — DX: Atherosclerosis of aorta: I70.0

## 2023-02-12 HISTORY — DX: Benign prostatic hyperplasia without lower urinary tract symptoms: N40.0

## 2023-02-12 SURGERY — HERNIORRHAPHY, INGUINAL, ROBOT-ASSISTED, LAPAROSCOPIC
Anesthesia: General | Site: Inguinal | Laterality: Bilateral

## 2023-02-12 MED ORDER — 0.9 % SODIUM CHLORIDE (POUR BTL) OPTIME
TOPICAL | Status: DC | PRN
  Administered 2023-02-12: 500 mL

## 2023-02-12 MED ORDER — KETAMINE HCL 50 MG/5ML IJ SOSY
PREFILLED_SYRINGE | INTRAMUSCULAR | Status: AC
  Filled 2023-02-12: qty 5

## 2023-02-12 MED ORDER — GABAPENTIN 300 MG PO CAPS
ORAL_CAPSULE | ORAL | Status: AC
  Filled 2023-02-12: qty 1

## 2023-02-12 MED ORDER — HYDROCODONE-ACETAMINOPHEN 5-325 MG PO TABS: 1.0000 | ORAL_TABLET | Freq: Four times a day (QID) | ORAL | 0 refills | Status: DC | PRN

## 2023-02-12 MED ORDER — KETAMINE HCL 50 MG/ML IJ SOLN
INTRAMUSCULAR | Status: DC | PRN
  Administered 2023-02-12: 50 mg via INTRAMUSCULAR

## 2023-02-12 MED ORDER — CHLORHEXIDINE GLUCONATE 0.12 % MT SOLN
OROMUCOSAL | Status: AC
  Administered 2023-02-12: 15 mL via OROMUCOSAL
  Filled 2023-02-12: qty 15

## 2023-02-12 MED ORDER — PROPOFOL 10 MG/ML IV BOLUS
INTRAVENOUS | Status: AC
  Filled 2023-02-12: qty 20

## 2023-02-12 MED ORDER — SUGAMMADEX SODIUM 200 MG/2ML IV SOLN
INTRAVENOUS | Status: DC | PRN
  Administered 2023-02-12: 150 mg via INTRAVENOUS

## 2023-02-12 MED ORDER — BUPIVACAINE LIPOSOME 1.3 % IJ SUSP
INTRAMUSCULAR | Status: DC | PRN
  Administered 2023-02-12: 20 mL

## 2023-02-12 MED ORDER — FENTANYL CITRATE (PF) 100 MCG/2ML IJ SOLN
INTRAMUSCULAR | Status: AC
  Filled 2023-02-12: qty 2

## 2023-02-12 MED ORDER — EPINEPHRINE PF 1 MG/ML IJ SOLN
INTRAMUSCULAR | Status: AC
  Filled 2023-02-12: qty 1

## 2023-02-12 MED ORDER — PHENYLEPHRINE 80 MCG/ML (10ML) SYRINGE FOR IV PUSH (FOR BLOOD PRESSURE SUPPORT)
PREFILLED_SYRINGE | INTRAVENOUS | Status: AC
  Filled 2023-02-12: qty 10

## 2023-02-12 MED ORDER — BUPIVACAINE LIPOSOME 1.3 % IJ SUSP
INTRAMUSCULAR | Status: AC
  Filled 2023-02-12: qty 20

## 2023-02-12 MED ORDER — BUPIVACAINE-EPINEPHRINE 0.5% -1:200000 IJ SOLN
INTRAMUSCULAR | Status: DC | PRN
  Administered 2023-02-12: 30 mL

## 2023-02-12 MED ORDER — OXYCODONE HCL 5 MG/5ML PO SOLN: 5.0000 mg | Freq: Once | ORAL | Status: DC | PRN

## 2023-02-12 MED ORDER — BUPIVACAINE HCL (PF) 0.5 % IJ SOLN
INTRAMUSCULAR | Status: AC
  Filled 2023-02-12: qty 30

## 2023-02-12 MED ORDER — ACETAMINOPHEN 10 MG/ML IV SOLN
INTRAVENOUS | Status: AC
  Filled 2023-02-12: qty 100

## 2023-02-12 MED ORDER — CEFAZOLIN SODIUM-DEXTROSE 2-4 GM/100ML-% IV SOLN
INTRAVENOUS | Status: AC
  Filled 2023-02-12: qty 100

## 2023-02-12 MED ORDER — OXYCODONE HCL 5 MG PO TABS: 5.0000 mg | ORAL_TABLET | Freq: Once | ORAL | Status: DC | PRN

## 2023-02-12 MED ORDER — DEXAMETHASONE SODIUM PHOSPHATE 10 MG/ML IJ SOLN
INTRAMUSCULAR | Status: AC
  Filled 2023-02-12: qty 1

## 2023-02-12 MED ORDER — LIDOCAINE HCL (PF) 2 % IJ SOLN
INTRAMUSCULAR | Status: AC
  Filled 2023-02-12: qty 5

## 2023-02-12 MED ORDER — DOCUSATE SODIUM 100 MG PO CAPS: 100.0000 mg | ORAL_CAPSULE | Freq: Two times a day (BID) | ORAL | 0 refills | Status: AC | PRN

## 2023-02-12 MED ORDER — SUCCINYLCHOLINE CHLORIDE 200 MG/10ML IV SOSY
PREFILLED_SYRINGE | INTRAVENOUS | Status: AC
  Filled 2023-02-12: qty 10

## 2023-02-12 MED ORDER — EPHEDRINE 5 MG/ML INJ
INTRAVENOUS | Status: AC
  Filled 2023-02-12: qty 5

## 2023-02-12 MED ORDER — ROCURONIUM BROMIDE 10 MG/ML (PF) SYRINGE
PREFILLED_SYRINGE | INTRAVENOUS | Status: AC
  Filled 2023-02-12: qty 10

## 2023-02-12 MED ORDER — DEXAMETHASONE SODIUM PHOSPHATE 10 MG/ML IJ SOLN
INTRAMUSCULAR | Status: DC | PRN
  Administered 2023-02-12: 10 mg via INTRAVENOUS

## 2023-02-12 MED ORDER — PHENYLEPHRINE 80 MCG/ML (10ML) SYRINGE FOR IV PUSH (FOR BLOOD PRESSURE SUPPORT)
PREFILLED_SYRINGE | INTRAVENOUS | Status: DC | PRN
  Administered 2023-02-12 (×2): 80 ug via INTRAVENOUS

## 2023-02-12 MED ORDER — ACETAMINOPHEN 325 MG PO TABS: 650.0000 mg | ORAL_TABLET | Freq: Three times a day (TID) | ORAL | 0 refills | Status: AC | PRN

## 2023-02-12 MED ORDER — ROCURONIUM BROMIDE 100 MG/10ML IV SOLN
INTRAVENOUS | Status: DC | PRN
  Administered 2023-02-12: 30 mg via INTRAVENOUS
  Administered 2023-02-12: 20 mg via INTRAVENOUS
  Administered 2023-02-12: 5 mg via INTRAVENOUS

## 2023-02-12 MED ORDER — FENTANYL CITRATE (PF) 100 MCG/2ML IJ SOLN
INTRAMUSCULAR | Status: DC | PRN
  Administered 2023-02-12 (×2): 50 ug via INTRAVENOUS

## 2023-02-12 MED ORDER — ACETAMINOPHEN 500 MG PO TABS
ORAL_TABLET | ORAL | Status: AC
  Administered 2023-02-12: 1000 mg via ORAL
  Filled 2023-02-12: qty 2

## 2023-02-12 MED ORDER — SUCCINYLCHOLINE CHLORIDE 200 MG/10ML IV SOSY
PREFILLED_SYRINGE | INTRAVENOUS | Status: DC | PRN
  Administered 2023-02-12: 100 mg via INTRAVENOUS

## 2023-02-12 MED ORDER — PROPOFOL 10 MG/ML IV BOLUS
INTRAVENOUS | Status: DC | PRN
  Administered 2023-02-12: 70 mg via INTRAVENOUS
  Administered 2023-02-12: 30 mg via INTRAVENOUS

## 2023-02-12 MED ORDER — ONDANSETRON HCL 4 MG/2ML IJ SOLN
INTRAMUSCULAR | Status: AC
  Filled 2023-02-12: qty 2

## 2023-02-12 MED ORDER — ONDANSETRON HCL 4 MG/2ML IJ SOLN
INTRAMUSCULAR | Status: DC | PRN
  Administered 2023-02-12: 4 mg via INTRAVENOUS

## 2023-02-12 MED ORDER — FENTANYL CITRATE (PF) 100 MCG/2ML IJ SOLN: 25.0000 ug | INTRAMUSCULAR | Status: DC | PRN

## 2023-02-12 MED ORDER — LIDOCAINE HCL (CARDIAC) PF 100 MG/5ML IV SOSY
PREFILLED_SYRINGE | INTRAVENOUS | Status: DC | PRN
  Administered 2023-02-12: 50 mg via INTRAVENOUS

## 2023-02-12 SURGICAL SUPPLY — 53 items
ADH SKN CLS APL DERMABOND .7 (GAUZE/BANDAGES/DRESSINGS) ×1
BAG PRESSURE INF REUSE 1000 (BAG) IMPLANT
BLADE SURG SZ11 CARB STEEL (BLADE) ×1 IMPLANT
BNDG GAUZE DERMACEA FLUFF 4 (GAUZE/BANDAGES/DRESSINGS) ×1 IMPLANT
BNDG GZE DERMACEA 4 6PLY (GAUZE/BANDAGES/DRESSINGS) ×1
COVER TIP SHEARS 8 DVNC (MISCELLANEOUS) ×1 IMPLANT
COVER TIP SHEARS 8MM DA VINCI (MISCELLANEOUS) ×1
COVER WAND RF STERILE (DRAPES) ×1 IMPLANT
DERMABOND ADVANCED .7 DNX12 (GAUZE/BANDAGES/DRESSINGS) ×1 IMPLANT
DRAPE ARM DVNC X/XI (DISPOSABLE) ×3 IMPLANT
DRAPE COLUMN DVNC XI (DISPOSABLE) ×1 IMPLANT
DRAPE DA VINCI XI ARM (DISPOSABLE) ×3
DRAPE DA VINCI XI COLUMN (DISPOSABLE) ×1
ELECT CAUTERY BLADE 6.4 (BLADE) IMPLANT
ELECT REM PT RETURN 9FT ADLT (ELECTROSURGICAL) ×1
ELECTRODE REM PT RTRN 9FT ADLT (ELECTROSURGICAL) ×1 IMPLANT
GLOVE BIOGEL PI IND STRL 7.0 (GLOVE) ×2 IMPLANT
GLOVE SURG SYN 6.5 ES PF (GLOVE) ×2 IMPLANT
GLOVE SURG SYN 6.5 PF PI (GLOVE) ×2 IMPLANT
GOWN STRL REUS W/ TWL LRG LVL3 (GOWN DISPOSABLE) ×3 IMPLANT
GOWN STRL REUS W/TWL LRG LVL3 (GOWN DISPOSABLE) ×3
IRRIGATOR SUCT 8 DISP DVNC XI (IRRIGATION / IRRIGATOR) IMPLANT
IRRIGATOR SUCTION 8MM XI DISP (IRRIGATION / IRRIGATOR)
IV NS 1000ML (IV SOLUTION)
IV NS 1000ML BAXH (IV SOLUTION) IMPLANT
LABEL OR SOLS (LABEL) IMPLANT
MANIFOLD NEPTUNE II (INSTRUMENTS) ×1 IMPLANT
MESH 3DMAX MID 4X6 LT LRG (Mesh General) IMPLANT
MESH 3DMAX MID 4X6 RT LRG (Mesh General) IMPLANT
NDL INSUFFLATION 14GA 120MM (NEEDLE) ×1 IMPLANT
NEEDLE HYPO 22GX1.5 SAFETY (NEEDLE) ×1 IMPLANT
NEEDLE INSUFFLATION 14GA 120MM (NEEDLE) ×1 IMPLANT
OBTURATOR OPTICAL STANDARD 8MM (TROCAR) ×1
OBTURATOR OPTICAL STND 8 DVNC (TROCAR) ×1
OBTURATOR OPTICALSTD 8 DVNC (TROCAR) ×1 IMPLANT
PACK LAP CHOLECYSTECTOMY (MISCELLANEOUS) ×1 IMPLANT
PENCIL SMOKE EVACUATOR (MISCELLANEOUS) ×1 IMPLANT
SEAL CANN UNIV 5-8 DVNC XI (MISCELLANEOUS) ×3 IMPLANT
SEAL XI 5MM-8MM UNIVERSAL (MISCELLANEOUS) ×3
SET TUBE SMOKE EVAC HIGH FLOW (TUBING) ×1 IMPLANT
SOL ELECTROSURG ANTI STICK (MISCELLANEOUS) ×1
SOLUTION ELECTROSURG ANTI STCK (MISCELLANEOUS) ×1 IMPLANT
SPONGE T-LAP 4X18 ~~LOC~~+RFID (SPONGE) IMPLANT
SUT MNCRL 4-0 (SUTURE) ×1
SUT MNCRL 4-0 27XMFL (SUTURE) ×1
SUT VIC AB 2-0 SH 27 (SUTURE) ×1
SUT VIC AB 2-0 SH 27XBRD (SUTURE) ×1 IMPLANT
SUT VLOC 90 6 CV-15 VIOLET (SUTURE) ×2 IMPLANT
SUTURE MNCRL 4-0 27XMF (SUTURE) ×1 IMPLANT
SYR 30ML LL (SYRINGE) ×1 IMPLANT
TAPE TRANSPORE STRL 2 31045 (GAUZE/BANDAGES/DRESSINGS) ×1 IMPLANT
TRAP FLUID SMOKE EVACUATOR (MISCELLANEOUS) ×1 IMPLANT
WATER STERILE IRR 500ML POUR (IV SOLUTION) ×1 IMPLANT

## 2023-02-12 NOTE — Anesthesia Preprocedure Evaluation (Signed)
Anesthesia Evaluation  Patient identified by MRN, date of birth, ID band Patient awake    Reviewed: Allergy & Precautions, H&P , NPO status , Patient's Chart, lab work & pertinent test results  History of Anesthesia Complications Negative for: history of anesthetic complications  Airway Mallampati: III  TM Distance: >3 FB Neck ROM: limited    Dental  (+) Chipped, Dental Advidsory Given   Pulmonary neg shortness of breath, former smoker   Pulmonary exam normal        Cardiovascular Exercise Tolerance: Good hypertension, + dysrhythmias Atrial Fibrillation      Neuro/Psych negative neurological ROS  negative psych ROS   GI/Hepatic Neg liver ROS,GERD  Medicated and Controlled,,  Endo/Other  negative endocrine ROS    Renal/GU      Musculoskeletal   Abdominal   Peds  Hematology negative hematology ROS (+)   Anesthesia Other Findings Past Medical History: No date: A-fib ([place]) 12/11/2016: Basal cell carcinoma     Comment:  left medial cheek No date: GERD (gastroesophageal reflux disease) No date: Hypertension 03/10/2018: Squamous cell carcinoma of skin     Comment:  left crown scalp  Past Surgical History: No date: CHOLECYSTECTOMY No date: TONSILLECTOMY  BMI    Body Mass Index: 23.15 kg/m      Reproductive/Obstetrics negative OB ROS                             Anesthesia Physical Anesthesia Plan  ASA: 3  Anesthesia Plan: General LMA   Post-op Pain Management:    Induction: Intravenous  PONV Risk Score and Plan: Dexamethasone, Ondansetron and Treatment may vary due to age or medical condition  Airway Management Planned: Oral ETT  Additional Equipment:   Intra-op Plan:   Post-operative Plan: Extubation in OR  Informed Consent: I have reviewed the patients History and Physical, chart, labs and discussed the procedure including the risks, benefits and alternatives for the  proposed anesthesia with the patient or authorized representative who has indicated his/her understanding and acceptance.     Dental Advisory Given  Plan Discussed with: Anesthesiologist, CRNA and Surgeon  Anesthesia Plan Comments: (Patient consented for risks of anesthesia including but not limited to:  - adverse reactions to medications - damage to eyes, teeth, lips or other oral mucosa - nerve damage due to positioning  - sore throat or hoarseness - Damage to heart, brain, nerves, lungs, other parts of body or loss of life  Patient voiced understanding.)        Anesthesia Quick Evaluation

## 2023-02-12 NOTE — Op Note (Signed)
Preoperative diagnosis: bilateral, reducible, initial inguinal Hernia.  Postoperative diagnosis: bilatera inguinal Hernia  Procedure: Robotic assisted laparoscopic bilaterl inguinal hernia repair with mesh  Anesthesia: General  Surgeon: Dr. Lysle Pearl  Wound Classification: Clean  Specimen: none  Complications: None  Estimated Blood Loss: 44m   Indications:  inguinal hernia. Repair was indicated to avoid complications of incarceration, obstruction and pain, and a prosthetic mesh repair was elected.  See H&P for further details.  Findings: 1. Vas Deferens and cord structures identified and preserved 2. Bard 3D max medium weight mesh used for repair 3. Adequate hemostasis achieved  Description of procedure: The patient was taken to the operating room. A time-out was completed verifying correct patient, procedure, site, positioning, and implant(s) and/or special equipment prior to beginning this procedure.  Area was prepped and draped in the usual sterile fashion. An incision was marked 20 cm above the pubic tubercle, slightly above the umbilicus  Scrotum wrapped in Kerlix roll.  Veress needle inserted at palmer's point.  Saline drop test noted to be positive with gradual increase in pressure after initiation of gas insufflation.  15 mm of pressure was achieved prior to removing the Veress needle and then placing a 8 mm port via the Optiview technique through the supraumbilical site.  Inspection of the area afterwards noted no injury to the surrounding organs during insertion of the needle and the port.  2 port sites were marked 8 cm to the lateral sides of the initial port, and a 8 mm robotic port was placed on the left side, another 8 mm robotic port on the right side under direct supervision.  Local anesthesia  infused to the preplanned incision sites prior to insertion of the port.  The [place] then brought into the operative field and docked to the ports.  Examination of  the abdominal cavity noted a bilateral inguinal hernia.  Left side addressed first. A peritoneal flap was created approximately 8cm cephalad to the defect by using scissors with electrocautery.  Dissection was carried down towards the pubic tubercle, developing the myopectineal orifice view.  Laterally the flap was carried towards the ASIS.  large hernia sac was noted, which carefully dissected away from the adjacent tissues to be fully reduced out of hernia cavity.  Any bleeding was controlled with combination of electrocautery and manual pressure.  One spot above pubic tubercle needed extra cauterization for control.  After confirming adequate dissection and the peritoneal reflection completely down and away from the cord structures, a Large Bard 3DMax medium weight mesh was placed within the anterior abdominal wall, secured in place using 2-0 Vicryl on an SH needle immediately above the pubic tubercle.  After noting proper placement of the mesh with the peritoneal reflection deep to it, the previously created peritoneal flap was secured back up to the anterior abdominal wall using running 3-0 V-Lock.  Both needles were then removed out of the abdominal cavity,   right side addressed next. A peritoneal flap was created approximately 8cm cephalad to the defect by using scissors with electrocautery.  Dissection was carried down towards the pubic tubercle, developing the myopectineal orifice view.  Laterally the flap was carried towards the ASIS.  large hernia sac was noted, which carefully dissected away from the adjacent tissues to be fully reduced out of hernia cavity.  Any bleeding was controlled with combination of electrocautery and manual pressure.    After confirming adequate dissection and the peritoneal reflection completely down and away from the cord  structures, a Large Bard 3DMax medium weight mesh was placed within the anterior abdominal wall, secured in place using 2-0 Vicryl on an SH needle  immediately above the pubic tubercle.  After noting proper placement of the mesh with the peritoneal reflection deep to it, the previously created peritoneal flap was secured back up to the anterior abdominal wall using running 3-0 V-Lock.  Both needles were then removed out of the abdominal cavity. Xi platform undocked from the ports and removed off of operative field.  exparel infused as ilioinguinal block.  Abdomen then desufflated and ports removed. All the skin incisions were then closed with a subcuticular stitch of Monocryl 4-0. Dermabond was applied. The testis was gently pulled down into its anatomic position in the scrotum.  The patient tolerated the procedure well and was taken to the postanesthesia care unit in stable condition. Sponge and instrument count correct at end of procedure.

## 2023-02-12 NOTE — Interval H&P Note (Signed)
No change. OK to proceed.

## 2023-02-12 NOTE — Discharge Instructions (Addendum)
AMBULATORY SURGERY  DISCHARGE INSTRUCTIONS   Hernia repair, Care After This sheet gives you information about how to care for yourself after your procedure. Your health care provider may also give you more specific instructions. If you have problems or questions, contact your health care provider. What can I expect after the procedure? After your procedure, it is common to have the following: Pain in your abdomen, especially in the incision areas. You will be given medicine to control the pain. Tiredness. This is a normal part of the recovery process. Your energy level will return to normal over the next several weeks. Changes in your bowel movements, such as constipation or needing to go more often. Talk with your health care provider about how to manage this. Follow these instructions at home: Medicines  tylenol as needed for discomfort.    Use narcotics, if prescribed, only when tylenol is not enough to control pain.  325-'650mg'$  every 8hrs to max of '3000mg'$ /24hrs (including the '325mg'$  in every norco dose) for the tylenol.    RESUME PRADAXA IN 48HRS PLEASE RECORD NUMBER OF PILLS TAKEN UNTIL NEXT FOLLOW UP APPT.  THIS WILL HELP DETERMINE HOW READY YOU ARE TO BE RELEASED FROM ANY ACTIVITY RESTRICTIONS Do not drive or use heavy machinery while taking prescription pain medicine. Do not drink alcohol while taking prescription pain medicine.  Incision care    Follow instructions from your health care provider about how to take care of your incision areas. Make sure you: Keep your incisions clean and dry. Wash your hands with soap and water before and after applying medicine to the areas, and before and after changing your bandage (dressing). If soap and water are not available, use hand sanitizer. Change your dressing as told by your health care provider. Leave stitches (sutures), skin glue, or adhesive strips in place. These skin closures may need to stay in place for 2 weeks or longer. If  adhesive strip edges start to loosen and curl up, you may trim the loose edges. Do not remove adhesive strips completely unless your health care provider tells you to do that. Do not wear tight clothing over the incisions. Tight clothing may rub and irritate the incision areas, which may cause the incisions to open. Do not take baths, swim, or use a hot tub until your health care provider approves. OK TO SHOWER IN 24HRS.   Check your incision area every day for signs of infection. Check for: More redness, swelling, or pain. More fluid or blood. Warmth. Pus or a bad smell. Activity Avoid lifting anything that is heavier than 10 lb (4.5 kg) for 2 weeks or until your health care provider says it is okay. No pushing/pulling greater than 30lbs You may resume normal activities as told by your health care provider. Ask your health care provider what activities are safe for you. Take rest breaks during the day as needed. Eating and drinking Follow instructions from your health care provider about what you can eat after surgery. To prevent or treat constipation while you are taking prescription pain medicine, your health care provider may recommend that you: Drink enough fluid to keep your urine clear or pale yellow. Take over-the-counter or prescription medicines. Eat foods that are high in fiber, such as fresh fruits and vegetables, whole grains, and beans. Limit foods that are high in fat and processed sugars, such as fried and sweet foods. General instructions Ask your health care provider when you will need an appointment to get your sutures or  staples removed. Keep all follow-up visits as told by your health care provider. This is important. Contact a health care provider if: You have more redness, swelling, or pain around your incisions. You have more fluid or blood coming from the incisions. Your incisions feel warm to the touch. You have pus or a bad smell coming from your incisions or  your dressing. You have a fever. You have an incision that breaks open (edges not staying together) after sutures or staples have been removed. You develop a rash. You have chest pain or difficulty breathing. You have pain or swelling in your legs. You feel light-headed or you faint. Your abdomen swells (becomes distended). You have nausea or vomiting. You have blood in your stool (feces). This information is not intended to replace advice given to you by your health care provider. Make sure you discuss any questions you have with your health care provider. Document Released: 06/06/2005 Document Revised: 08/06/2018 Document Reviewed: 08/18/2016 Elsevier Interactive Patient Education  [employer].     The drugs that you were given will stay in your system until tomorrow so for the next 24 hours you should not:  Drive an automobile Make any legal decisions Drink any alcoholic beverage   You may resume regular meals tomorrow.  Today it is better to start with liquids and gradually work up to solid foods.  You may eat anything you prefer, but it is better to start with liquids, then soup and crackers, and gradually work up to solid foods.   Please notify your doctor immediately if you have any unusual bleeding, trouble breathing, redness and pain at the surgery site, drainage, fever, or pain not relieved by medication.    Information for Discharge Teaching:  DO NOT REMOVE TEAL EXPAREL BRACELET FOR 4 DAYS (96 hours); 02/16/2023  EXPAREL (bupivacaine liposome injectable suspension)   Your surgeon or anesthesiologist gave you EXPAREL(bupivacaine) to help control your pain after surgery.  EXPAREL is a local anesthetic that provides pain relief by numbing the tissue around the surgical site. EXPAREL is designed to release pain medication over time and can control pain for up to 72 hours. Depending on how you respond to EXPAREL, you may require less pain medication during your  recovery.  Possible side effects: Temporary loss of sensation or ability to move in the area where bupivacaine was injected. Nausea, vomiting, constipation Rarely, numbness and tingling in your mouth or lips, lightheadedness, or anxiety may occur. Call your doctor right away if you think you may be experiencing any of these sensations, or if you have other questions regarding possible side effects.  Follow all other discharge instructions given to you by your surgeon or nurse. Eat a healthy diet and drink plenty of water or other fluids.  If you return to the hospital for any reason within 96 hours following the administration of EXPAREL, it is important for health care providers to know that you have received this anesthetic. A teal colored band has been placed on your arm with the date, time and amount of EXPAREL you have received in order to alert and inform your health care providers. Please leave this armband in place for the full 96 hours following administration, and then you may remove the band.        Please contact your physician with any problems or Same Day Surgery at [phone], Monday through Friday 6 am to 4 pm, or [place] at [facility] number at [phone].

## 2023-02-12 NOTE — Anesthesia Procedure Notes (Signed)
Procedure Name: Intubation Date/Time: 02/12/2023 7:35 AM  Performed by: Levin Erp, CRNAPre-anesthesia Checklist: Patient identified, Emergency Drugs available, Suction available, Patient being monitored and Timeout performed Patient Re-evaluated:Patient Re-evaluated prior to induction Oxygen Delivery Method: Circle system utilized Preoxygenation: Pre-oxygenation with 100% oxygen Induction Type: IV induction Ventilation: Mask ventilation with difficulty Laryngoscope Size: McGraph and 4 Grade View: Grade II Tube type: Oral Tube size: 8.0 mm Number of attempts: 1 Airway Equipment and Method: Stylet Placement Confirmation: ETT inserted through vocal cords under direct vision, positive ETCO2 and breath sounds checked- equal and bilateral Secured at: 23 (at lip) cm Tube secured with: Tape Dental Injury: Teeth and Oropharynx as per pre-operative assessment

## 2023-02-12 NOTE — Anesthesia Postprocedure Evaluation (Signed)
Anesthesia Post Note  Patient: EVELIO NETTI  Procedure(s) Performed: XI ROBOTIC ASSISTED INGUINAL HERNIA (Bilateral: Inguinal)  Patient location during evaluation: PACU Anesthesia Type: General Level of consciousness: awake and alert Pain management: pain level controlled Vital Signs Assessment: post-procedure vital signs reviewed and stable Respiratory status: spontaneous breathing, nonlabored ventilation, respiratory function stable and patient connected to nasal cannula oxygen Cardiovascular status: blood pressure returned to baseline and stable Postop Assessment: no apparent nausea or vomiting Anesthetic complications: no  No notable events documented.   Last Vitals:  Vitals:   02/12/23 1004 02/12/23 1055  BP: 139/76 134/72  Pulse: 67 69  Resp: 14 16  Temp: 36.4 C 36.4 C  SpO2: 95% 97%    Last Pain:  Vitals:   02/12/23 1055  TempSrc: Temporal  PainSc:                  Dimas Millin

## 2023-02-12 NOTE — Transfer of Care (Signed)
Immediate Anesthesia Transfer of Care Note  Patient: Darren Wright  Procedure(s) Performed: XI ROBOTIC ASSISTED INGUINAL HERNIA (Bilateral: Inguinal)  Patient Location: PACU  Anesthesia Type:General  Level of Consciousness: sedated and responds to stimulation  Airway & Oxygen Therapy: Patient Spontanous Breathing and Patient connected to face mask oxygen  Post-op Assessment: Report given to RN and Post -op Vital signs reviewed and stable  Post vital signs: Reviewed and stable  Last Vitals:  Vitals Value Taken Time  BP 146/68 02/12/23 0926  Temp    Pulse 60 02/12/23 0930  Resp 11 02/12/23 0930  SpO2 99 % 02/12/23 0930  Vitals shown include unvalidated device data.  Last Pain:  Vitals:   02/12/23 0624  TempSrc: Oral  PainSc: 0-No pain      Patients Stated Pain Goal: 0 ([id number])  Complications: No notable events documented.

## 2023-02-18 DIAGNOSIS — E782 Mixed hyperlipidemia: Secondary | ICD-10-CM | POA: Diagnosis not present

## 2023-02-18 DIAGNOSIS — Z79899 Other long term (current) drug therapy: Secondary | ICD-10-CM | POA: Diagnosis not present

## 2023-02-18 DIAGNOSIS — I10 Essential (primary) hypertension: Secondary | ICD-10-CM | POA: Diagnosis not present

## 2023-02-25 DIAGNOSIS — Z79899 Other long term (current) drug therapy: Secondary | ICD-10-CM | POA: Diagnosis not present

## 2023-02-25 DIAGNOSIS — Z0000 Encounter for general adult medical examination without abnormal findings: Secondary | ICD-10-CM | POA: Diagnosis not present

## 2023-02-25 DIAGNOSIS — I10 Essential (primary) hypertension: Secondary | ICD-10-CM | POA: Diagnosis not present

## 2023-02-25 DIAGNOSIS — E7800 Pure hypercholesterolemia, unspecified: Secondary | ICD-10-CM | POA: Diagnosis not present

## 2023-02-25 DIAGNOSIS — I480 Paroxysmal atrial fibrillation: Secondary | ICD-10-CM | POA: Diagnosis not present

## 2023-02-25 DIAGNOSIS — J431 Panlobular emphysema: Secondary | ICD-10-CM | POA: Diagnosis not present

## 2023-02-25 DIAGNOSIS — E538 Deficiency of other specified B group vitamins: Secondary | ICD-10-CM | POA: Diagnosis not present

## 2023-04-02 ENCOUNTER — Ambulatory Visit: Payer: PPO | Admitting: Dermatology

## 2023-04-02 VITALS — BP 144/89 | HR 74

## 2023-04-02 DIAGNOSIS — X32XXXA Exposure to sunlight, initial encounter: Secondary | ICD-10-CM

## 2023-04-02 DIAGNOSIS — L578 Other skin changes due to chronic exposure to nonionizing radiation: Secondary | ICD-10-CM

## 2023-04-02 DIAGNOSIS — L820 Inflamed seborrheic keratosis: Secondary | ICD-10-CM

## 2023-04-02 DIAGNOSIS — L570 Actinic keratosis: Secondary | ICD-10-CM

## 2023-04-02 DIAGNOSIS — Z7189 Other specified counseling: Secondary | ICD-10-CM

## 2023-04-02 DIAGNOSIS — W908XXA Exposure to other nonionizing radiation, initial encounter: Secondary | ICD-10-CM | POA: Diagnosis not present

## 2023-04-02 DIAGNOSIS — L711 Rhinophyma: Secondary | ICD-10-CM

## 2023-04-02 DIAGNOSIS — L719 Rosacea, unspecified: Secondary | ICD-10-CM | POA: Diagnosis not present

## 2023-04-02 NOTE — Patient Instructions (Addendum)
Cryotherapy Aftercare  Wash gently with soap and water everyday.   Apply Vaseline and Band-Aid daily until healed.     Due to recent changes in healthcare laws, you may see results of your pathology and/or laboratory studies on MyChart before the doctors have had a chance to review them. We understand that in some cases there may be results that are confusing or concerning to you. Please understand that not all results are received at the same time and often the doctors may need to interpret multiple results in order to provide you with the best plan of care or course of treatment. Therefore, we ask that you please give us 2 business days to thoroughly review all your results before contacting the office for clarification. Should we see a critical lab result, you will be contacted sooner.   If You Need Anything After Your Visit  If you have any questions or concerns for your doctor, please call our main line at [phone] and press option 4 to reach your doctor's medical assistant. If no one answers, please leave a voicemail as directed and we will return your call as soon as possible. Messages left after 4 pm will be answered the following business day.   You may also send us a message via MyChart. We typically respond to MyChart messages within 1-2 business days.  For prescription refills, please ask your pharmacy to contact our office. Our fax number is [phone].  If you have an urgent issue when the clinic is closed that cannot wait until the next business day, you can page your doctor at the number below.    Please note that while we do our best to be available for urgent issues outside of office hours, we are not available 24/7.   If you have an urgent issue and are unable to reach us, you may choose to seek medical care at your doctor's office, retail clinic, urgent care center, or emergency room.  If you have a medical emergency, please immediately call 911 or go to the  emergency department.  Pager Numbers  - Dr. Kowalski: [phone]  - Dr. Moye: [phone]  - Dr. Stewart: [phone]  In the event of inclement weather, please call our main line at [phone] for an update on the status of any delays or closures.  Dermatology Medication Tips: Please keep the boxes that topical medications come in in order to help keep track of the instructions about where and how to use these. Pharmacies typically print the medication instructions only on the boxes and not directly on the medication tubes.   If your medication is too expensive, please contact our office at [phone] option 4 or send us a message through MyChart.   We are unable to tell what your co-pay for medications will be in advance as this is different depending on your insurance coverage. However, we may be able to find a substitute medication at lower cost or fill out paperwork to get insurance to cover a needed medication.   If a prior authorization is required to get your medication covered by your insurance company, please allow us 1-2 business days to complete this process.  Drug prices often vary depending on where the prescription is filled and some pharmacies may offer cheaper prices.  The website www.goodrx.com contains coupons for medications through different pharmacies. The prices here do not account for what the cost may be with help from insurance (it may be cheaper with your insurance), but the website can   give you the price if you did not use any insurance.  - You can print the associated coupon and take it with your prescription to the pharmacy.  - You may also stop by our office during regular business hours and pick up a GoodRx coupon card.  - If you need your prescription sent electronically to a different pharmacy, notify our office through [place] MyChart or by phone at [phone] option 4.     Si Usted Necesita Algo Despus de Su Visita  Tambin puede  enviarnos un mensaje a travs de MyChart. Por lo general respondemos a los mensajes de MyChart en el transcurso de 1 a 2 das hbiles.  Para renovar recetas, por favor pida a su farmacia que se ponga en contacto con nuestra oficina. Nuestro nmero de fax es el [phone].  Si tiene un asunto urgente cuando la clnica est cerrada y que no puede esperar hasta el siguiente da hbil, puede llamar/localizar a su doctor(a) al nmero que aparece a continuacin.   Por favor, tenga en cuenta que aunque hacemos todo lo posible para estar disponibles para asuntos urgentes fuera del horario de oficina, no estamos disponibles las 24 horas del da, los 7 das de la semana.   Si tiene un problema urgente y no puede comunicarse con nosotros, puede optar por buscar atencin mdica  en el consultorio de su doctor(a), en una clnica privada, en un centro de atencin urgente o en una sala de emergencias.  Si tiene una emergencia mdica, por favor llame inmediatamente al 911 o vaya a la sala de emergencias.  Nmeros de bper  - Dr. Kowalski: [phone]  - Dra. Moye: [phone]  - Dra. Stewart: [phone]  En caso de inclemencias del tiempo, por favor llame a nuestra lnea principal al [phone] para una actualizacin sobre el estado de cualquier retraso o cierre.  Consejos para la medicacin en dermatologa: Por favor, guarde las cajas en las que vienen los medicamentos de uso tpico para ayudarle a seguir las instrucciones sobre dnde y cmo usarlos. Las farmacias generalmente imprimen las instrucciones del medicamento slo en las cajas y no directamente en los tubos del medicamento.   Si su medicamento es muy caro, por favor, pngase en contacto con nuestra oficina llamando al [phone] y presione la opcin 4 o envenos un mensaje a travs de MyChart.   No podemos decirle cul ser su copago por los medicamentos por adelantado ya que esto es diferente dependiendo de la cobertura de su seguro.  Sin embargo, es posible que podamos encontrar un medicamento sustituto a menor costo o llenar un formulario para que el seguro cubra el medicamento que se considera necesario.   Si se requiere una autorizacin previa para que su compaa de seguros cubra su medicamento, por favor permtanos de 1 a 2 das hbiles para completar este proceso.  Los precios de los medicamentos varan con frecuencia dependiendo del lugar de dnde se surte la receta y alguna farmacias pueden ofrecer precios ms baratos.  El sitio web www.goodrx.com tiene cupones para medicamentos de diferentes farmacias. Los precios aqu no tienen en cuenta lo que podra costar con la ayuda del seguro (puede ser ms barato con su seguro), pero el sitio web puede darle el precio si no utiliz ningn seguro.  - Puede imprimir el cupn correspondiente y llevarlo con su receta a la farmacia.  - Tambin puede pasar por nuestra oficina durante el horario de atencin regular y recoger una tarjeta de cupones de GoodRx.  -   Si necesita que su receta se enve electrnicamente a una farmacia diferente, informe a nuestra oficina a travs de MyChart de [place] o por telfono llamando al [phone] y presione la opcin 4.  

## 2023-04-02 NOTE — Progress Notes (Signed)
Follow-Up Visit   Subjective  Darren Wright is a 89 y.o. male who presents for the following: 4 months f/u on precancers on hi scalp. The patient has  lesions to be evaluated, some may be new or changing and the patient may have concern these could be cancer.   The following portions of the chart were reviewed this encounter and updated as appropriate: medications, allergies, medical history  Review of Systems:  No other skin or systemic complaints except as noted in HPI or Assessment and Plan.  Objective  Well appearing patient in no apparent distress; mood and affect are within normal limits. A focused examination was performed of the following areas: Relevant exam findings are noted in the Assessment and Plan.  left cheek x 1, Stuck-on, waxy, tan-brown papule --Discussed benign etiology and prognosis.   scalp x 18 (18) Erythematous thin papules/macules with gritty scale. Erythematous thin papules/macules with gritty scale.    Assessment & Plan   Inflamed seborrheic keratosis left cheek x 1,  Symptomatic, irritating, patient would like treated.   Destruction of lesion - left cheek x 1, Complexity: simple   Destruction method: cryotherapy   Informed consent: discussed and consent obtained   Timeout:  patient name, date of birth, surgical site, and procedure verified Lesion destroyed using liquid nitrogen: Yes   Region frozen until ice ball extended beyond lesion: Yes   Outcome: patient tolerated procedure well with no complications   Post-procedure details: wound care instructions given    AK (actinic keratosis) (18) scalp x 18  Actinic keratoses are precancerous spots that appear secondary to cumulative UV radiation exposure/sun exposure over time. They are chronic with expected duration over 1 year. A portion of actinic keratoses will progress to squamous cell carcinoma of the skin. It is not possible to reliably predict which spots will progress to skin cancer and  so treatment is recommended to prevent development of skin cancer.  Recommend daily broad spectrum sunscreen SPF 30+ to sun-exposed areas, reapply every 2 hours as needed.  Recommend staying in the shade or wearing long sleeves, sun glasses (UVA+UVB protection) and wide brim hats (4-inch brim around the entire circumference of the hat). Call for new or changing lesions.   Destruction of lesion - scalp x 18 Complexity: simple   Destruction method: cryotherapy   Informed consent: discussed and consent obtained   Timeout:  patient name, date of birth, surgical site, and procedure verified Lesion destroyed using liquid nitrogen: Yes   Region frozen until ice ball extended beyond lesion: Yes   Outcome: patient tolerated procedure well with no complications   Post-procedure details: wound care instructions given    ROSACEA with Rhinophyma  Exam Mid face erythema with telangiectasias Chronic and persistent condition with duration or expected duration over one year. Condition is symptomatic/ bothersome to patient. Not currently at goal.  Rosacea is a chronic progressive skin condition usually affecting the face of adults, causing redness and/or acne bumps. It is treatable but not curable. It sometimes affects the eyes (ocular rosacea) as well. It may respond to topical and/or systemic medication and can flare with stress, sun exposure, alcohol, exercise, topical steroids (including hydrocortisone/cortisone 10) and some foods.  Daily application of broad spectrum spf 30+ sunscreen to face is recommended to reduce flares.  Treatment Plan Patient decline treatment    ACTINIC DAMAGE - chronic, secondary to cumulative UV radiation exposure/sun exposure over time - diffuse scaly erythematous macules with underlying dyspigmentation - Recommend daily broad spectrum  sunscreen SPF 30+ to sun-exposed areas, reapply every 2 hours as needed.  - Recommend staying in the shade or wearing long sleeves, sun  glasses (UVA+UVB protection) and wide brim hats (4-inch brim around the entire circumference of the hat). - Call for new or changing lesions.  Return in about 6 months (around 10/03/2023) for Aks .  IAngelique Holm, CMA, am acting as scribe for Armida Sans, MD .   Documentation: I have reviewed the above documentation for accuracy and completeness, and I agree with the above.  Armida Sans, MD

## 2023-04-09 ENCOUNTER — Encounter: Payer: Self-pay | Admitting: Dermatology

## 2023-04-17 DIAGNOSIS — H18513 Endothelial corneal dystrophy, bilateral: Secondary | ICD-10-CM | POA: Diagnosis not present

## 2023-04-17 DIAGNOSIS — H353231 Exudative age-related macular degeneration, bilateral, with active choroidal neovascularization: Secondary | ICD-10-CM | POA: Diagnosis not present

## 2023-04-17 DIAGNOSIS — H353211 Exudative age-related macular degeneration, right eye, with active choroidal neovascularization: Secondary | ICD-10-CM | POA: Diagnosis not present

## 2023-04-17 DIAGNOSIS — H353221 Exudative age-related macular degeneration, left eye, with active choroidal neovascularization: Secondary | ICD-10-CM | POA: Diagnosis not present

## 2023-04-17 DIAGNOSIS — H401111 Primary open-angle glaucoma, right eye, mild stage: Secondary | ICD-10-CM | POA: Diagnosis not present

## 2023-05-01 DIAGNOSIS — M79675 Pain in left toe(s): Secondary | ICD-10-CM | POA: Diagnosis not present

## 2023-05-01 DIAGNOSIS — M79674 Pain in right toe(s): Secondary | ICD-10-CM | POA: Diagnosis not present

## 2023-05-01 DIAGNOSIS — B351 Tinea unguium: Secondary | ICD-10-CM | POA: Diagnosis not present

## 2023-05-06 DIAGNOSIS — H401111 Primary open-angle glaucoma, right eye, mild stage: Secondary | ICD-10-CM | POA: Diagnosis not present

## 2023-05-12 DIAGNOSIS — H40002 Preglaucoma, unspecified, left eye: Secondary | ICD-10-CM | POA: Diagnosis not present

## 2023-05-12 DIAGNOSIS — H401111 Primary open-angle glaucoma, right eye, mild stage: Secondary | ICD-10-CM | POA: Diagnosis not present

## 2023-05-12 DIAGNOSIS — Z961 Presence of intraocular lens: Secondary | ICD-10-CM | POA: Diagnosis not present

## 2023-06-01 DIAGNOSIS — H353211 Exudative age-related macular degeneration, right eye, with active choroidal neovascularization: Secondary | ICD-10-CM | POA: Diagnosis not present

## 2023-07-07 DIAGNOSIS — H353221 Exudative age-related macular degeneration, left eye, with active choroidal neovascularization: Secondary | ICD-10-CM | POA: Diagnosis not present

## 2023-07-07 DIAGNOSIS — H353211 Exudative age-related macular degeneration, right eye, with active choroidal neovascularization: Secondary | ICD-10-CM | POA: Diagnosis not present

## 2023-07-15 DIAGNOSIS — I480 Paroxysmal atrial fibrillation: Secondary | ICD-10-CM | POA: Diagnosis not present

## 2023-07-15 DIAGNOSIS — J431 Panlobular emphysema: Secondary | ICD-10-CM | POA: Diagnosis not present

## 2023-07-15 DIAGNOSIS — I10 Essential (primary) hypertension: Secondary | ICD-10-CM | POA: Diagnosis not present

## 2023-07-15 DIAGNOSIS — R001 Bradycardia, unspecified: Secondary | ICD-10-CM | POA: Diagnosis not present

## 2023-07-15 DIAGNOSIS — E782 Mixed hyperlipidemia: Secondary | ICD-10-CM | POA: Diagnosis not present

## 2023-08-18 DIAGNOSIS — H353211 Exudative age-related macular degeneration, right eye, with active choroidal neovascularization: Secondary | ICD-10-CM | POA: Diagnosis not present

## 2023-08-21 ENCOUNTER — Other Ambulatory Visit: Payer: Self-pay

## 2023-08-21 DIAGNOSIS — Z79899 Other long term (current) drug therapy: Secondary | ICD-10-CM | POA: Diagnosis not present

## 2023-08-21 DIAGNOSIS — I10 Essential (primary) hypertension: Secondary | ICD-10-CM | POA: Diagnosis not present

## 2023-08-21 MED ORDER — COMIRNATY 30 MCG/0.3ML IM SUSY
0.3000 mL | PREFILLED_SYRINGE | Freq: Once | INTRAMUSCULAR | 0 refills | Status: AC
  Filled 2023-08-21: qty 0.3, 1d supply, fill #0

## 2023-08-21 MED ORDER — FLUAD 0.5 ML IM SUSY
0.5000 mL | PREFILLED_SYRINGE | Freq: Once | INTRAMUSCULAR | 0 refills | Status: AC
  Filled 2023-08-21: qty 0.5, 1d supply, fill #0

## 2023-08-28 DIAGNOSIS — I480 Paroxysmal atrial fibrillation: Secondary | ICD-10-CM | POA: Diagnosis not present

## 2023-08-28 DIAGNOSIS — E538 Deficiency of other specified B group vitamins: Secondary | ICD-10-CM | POA: Diagnosis not present

## 2023-08-28 DIAGNOSIS — I10 Essential (primary) hypertension: Secondary | ICD-10-CM | POA: Diagnosis not present

## 2023-08-28 DIAGNOSIS — E7800 Pure hypercholesterolemia, unspecified: Secondary | ICD-10-CM | POA: Diagnosis not present

## 2023-08-28 DIAGNOSIS — Z79899 Other long term (current) drug therapy: Secondary | ICD-10-CM | POA: Diagnosis not present

## 2023-08-28 DIAGNOSIS — J431 Panlobular emphysema: Secondary | ICD-10-CM | POA: Diagnosis not present

## 2023-08-28 DIAGNOSIS — D649 Anemia, unspecified: Secondary | ICD-10-CM | POA: Diagnosis not present

## 2023-09-29 DIAGNOSIS — H353221 Exudative age-related macular degeneration, left eye, with active choroidal neovascularization: Secondary | ICD-10-CM | POA: Diagnosis not present

## 2023-09-29 DIAGNOSIS — H353211 Exudative age-related macular degeneration, right eye, with active choroidal neovascularization: Secondary | ICD-10-CM | POA: Diagnosis not present

## 2023-10-08 ENCOUNTER — Ambulatory Visit: Payer: PPO | Admitting: Dermatology

## 2023-10-08 ENCOUNTER — Encounter: Payer: Self-pay | Admitting: Dermatology

## 2023-10-08 DIAGNOSIS — W908XXA Exposure to other nonionizing radiation, initial encounter: Secondary | ICD-10-CM

## 2023-10-08 DIAGNOSIS — L578 Other skin changes due to chronic exposure to nonionizing radiation: Secondary | ICD-10-CM

## 2023-10-08 DIAGNOSIS — L820 Inflamed seborrheic keratosis: Secondary | ICD-10-CM | POA: Diagnosis not present

## 2023-10-08 DIAGNOSIS — L570 Actinic keratosis: Secondary | ICD-10-CM

## 2023-10-08 NOTE — Patient Instructions (Addendum)

## 2023-10-08 NOTE — Progress Notes (Signed)
   Follow-Up Visit   Subjective  Darren Wright is a 89 y.o. male who presents for the following: 6 month AK follow up at scalp. Patient also with a spot at left middle finger, possible wart that is aggravating.   Patient accompanied by wife who contributes to history.   The patient has spots, moles and lesions to be evaluated, some may be new or changing and the patient may have concern these could be cancer.   The following portions of the chart were reviewed this encounter and updated as appropriate: medications, allergies, medical history  Review of Systems:  No other skin or systemic complaints except as noted in HPI or Assessment and Plan.  Objective  Well appearing patient in no apparent distress; mood and affect are within normal limits.   A focused examination was performed of the following areas: Hands, arms, scalp and face  Relevant exam findings are noted in the Assessment and Plan.  Left Dorsal Mid 3rd Finger Erythematous stuck-on, waxy papule or plaque  Scalp, nose (20) Erythematous thin papules/macules with gritty scale.     Assessment & Plan     Inflamed seborrheic keratosis Left Dorsal Mid 3rd Finger  Symptomatic, irritating, patient would like treated.  Benign-appearing.  Call clinic for new or changing lesions.    Destruction of lesion - Left Dorsal Mid 3rd Finger  Destruction method: cryotherapy   Informed consent: discussed and consent obtained   Lesion destroyed using liquid nitrogen: Yes   Cryotherapy cycles:  2 Outcome: patient tolerated procedure well with no complications   Post-procedure details: wound care instructions given    AK (actinic keratosis) (20) Scalp, nose  Actinic keratoses are precancerous spots that appear secondary to cumulative UV radiation exposure/sun exposure over time. They are chronic with expected duration over 1 year. A portion of actinic keratoses will progress to squamous cell carcinoma of the skin. It is  not possible to reliably predict which spots will progress to skin cancer and so treatment is recommended to prevent development of skin cancer.  Recommend daily broad spectrum sunscreen SPF 30+ to sun-exposed areas, reapply every 2 hours as needed.  Recommend staying in the shade or wearing long sleeves, sun glasses (UVA+UVB protection) and wide brim hats (4-inch brim around the entire circumference of the hat). Call for new or changing lesions.  Patient defers field treatment with PDT and topical.  Destruction of lesion - Scalp, nose (20)  Destruction method: cryotherapy   Informed consent: discussed and consent obtained   Lesion destroyed using liquid nitrogen: Yes   Cryotherapy cycles:  2 Outcome: patient tolerated procedure well with no complications   Post-procedure details: wound care instructions given    ACTINIC DAMAGE - chronic, secondary to cumulative UV radiation exposure/sun exposure over time - diffuse scaly erythematous macules with underlying dyspigmentation - Recommend daily broad spectrum sunscreen SPF 30+ to sun-exposed areas, reapply every 2 hours as needed.  - Recommend staying in the shade or wearing long sleeves, sun glasses (UVA+UVB protection) and wide brim hats (4-inch brim around the entire circumference of the hat). - Call for new or changing lesions.   Return in about 6 months (around 04/06/2024) for TBSE, with Dr. Kirtland Bouchard, Hx BCC, Hx SCC.  Anise Salvo, RMA, am acting as scribe for Armida Sans, MD .   Documentation: I have reviewed the above documentation for accuracy and completeness, and I agree with the above.  Armida Sans, MD

## 2023-10-22 ENCOUNTER — Other Ambulatory Visit: Payer: PPO | Admitting: Urology

## 2023-10-28 ENCOUNTER — Ambulatory Visit: Payer: PPO | Admitting: Urology

## 2023-10-28 ENCOUNTER — Encounter: Payer: Self-pay | Admitting: Urology

## 2023-10-28 VITALS — BP 178/98 | HR 80 | Ht 71.0 in | Wt 162.0 lb

## 2023-10-28 DIAGNOSIS — D494 Neoplasm of unspecified behavior of bladder: Secondary | ICD-10-CM

## 2023-10-28 DIAGNOSIS — Z8551 Personal history of malignant neoplasm of bladder: Secondary | ICD-10-CM

## 2023-10-28 LAB — URINALYSIS, COMPLETE
Bilirubin, UA: NEGATIVE
Glucose, UA: NEGATIVE
Ketones, UA: NEGATIVE
Leukocytes,UA: NEGATIVE
Nitrite, UA: NEGATIVE
Protein,UA: NEGATIVE
RBC, UA: NEGATIVE
Specific Gravity, UA: 1.015 (ref 1.005–1.030)
Urobilinogen, Ur: 1 mg/dL (ref 0.2–1.0)
pH, UA: 7 (ref 5.0–7.5)

## 2023-10-28 LAB — MICROSCOPIC EXAMINATION: Bacteria, UA: NONE SEEN

## 2023-10-28 NOTE — Progress Notes (Signed)
10/28/2023 9:31 AM   Romeo Apple 03-08-1929 [id number]  Referring provider: Marguarite Arbour, MD [address] [facility] [place],  [place] [address]  Chief Complaint  Patient presents with   Cysto    HPI: Darren Wright is a 89 y.o. male with a history of low-grade urothelial carcinoma of the bladder.  On surveillance cystoscopy today he was found to have recurrent papillary tumor right lateral wall measuring ~ 3 cm.  TURBT was recommended.   PMH: Past Medical History:  Diagnosis Date   Aortic atherosclerosis (HCC)    Basal cell carcinoma 12/11/2016   left medial cheek   Bladder cancer (HCC) 2021   BPH (benign prostatic hyperplasia)    Colon polyp    COPD (chronic obstructive pulmonary disease) (HCC)    DDD (degenerative disc disease), cervical    Diverticulosis    GERD (gastroesophageal reflux disease)    Hepatic cyst    Hiatal hernia    History of angioedema    HLD (hyperlipidemia)    Hypertension    Inguinal hernia    Long term current use of antithrombotics/antiplatelets    a.) dabigatran   Macular degeneration    Osteoarthritis    PAF (paroxysmal atrial fibrillation) (HCC)    a.) CHA2DS2VASc = 4 (age x2, HTN, vascular disease history);  b.) rate/rhythm maintained on oral metoprolol succinate; chronically anticoagulated with dabigatran   PUD (peptic ulcer disease)    Squamous cell carcinoma of skin 03/10/2018   left crown scalp    Surgical History: Past Surgical History:  Procedure Laterality Date   CHOLECYSTECTOMY  2018   CYSTOSCOPY W/ RETROGRADES Bilateral 07/17/2020   Procedure: CYSTOSCOPY WITH RETROGRADE PYELOGRAM;  Surgeon: Riki Altes, MD;  Location: [facility] ORS;  Service: Urology;  Laterality: Bilateral;   TONSILLECTOMY     TRANSURETHRAL RESECTION OF BLADDER TUMOR WITH MITOMYCIN-C N/A 07/17/2020   Procedure: TRANSURETHRAL RESECTION OF BLADDER TUMOR WITH gemcitabine;  Surgeon: Riki Altes, MD;  Location: [facility] ORS;   Service: Urology;  Laterality: N/A;    Home Medications:  Allergies as of 10/28/2023       Reactions   Ibuprofen Hives   Penicillins    Swelling 1 week after having injection. Unsure if it was really the cause.        Medication List        Accurate as of October 28, 2023  9:31 AM. If you have any questions, ask your nurse or doctor.          benazepril-hydrochlorthiazide 20-12.5 MG tablet Commonly known as: LOTENSIN HCT Take 1 tablet by mouth daily.   cyanocobalamin 1000 MCG tablet Take 1,000 mcg by mouth daily.   dabigatran 150 MG Caps capsule Commonly known as: PRADAXA Take 150 mg by mouth 2 (two) times daily.   HYDROcodone-acetaminophen 5-325 MG tablet Commonly known as: Norco Take 1 tablet by mouth every 6 (six) hours as needed for up to 6 doses for moderate pain.   mometasone 0.1 % cream Commonly known as: ELOCON Apply once or twice daily to affected areas on legs 5 days a week as needed for rash/itching   pantoprazole 40 MG tablet Commonly known as: PROTONIX Take 40 mg by mouth daily.   PRESERVISION AREDS 2 PO Take 1 capsule by mouth in the morning and at bedtime.   tamsulosin 0.4 MG Caps capsule Commonly known as: FLOMAX Take 0.4 mg by mouth at bedtime.   timolol 0.5 % ophthalmic solution Commonly known as:  TIMOPTIC Place 1 drop into the right eye daily.        Allergies:  Allergies  Allergen Reactions   Ibuprofen Hives   Penicillins     Swelling 1 week after having injection. Unsure if it was really the cause.    Family History: Family History  Problem Relation Age of Onset   Pancreatic cancer Mother    Prostate cancer Father    Breast cancer Sister    Bladder Cancer Sister     Social History:  reports that he quit smoking about 39 years ago. His smoking use included cigarettes. He has never used smokeless tobacco. He reports that he does not currently use alcohol. He reports that he does not use drugs.   Physical Exam: BP  (!) 178/98   Pulse 80   Ht 5\' 11"  (1.803 m)   Wt 162 lb (73.5 kg)   BMI 22.59 kg/m   Constitutional:  Alert and oriented, No acute distress. HEENT: [place] AT Respiratory: Normal respiratory effort, no increased work of breathing. Psychiatric: Normal mood and affect.   Assessment & Plan:    1. Bladder tumor Recurrent bladder tumor Findings were discussed in detail with Mr. Bublitz and recommend TURBT The procedure was discussed including potential risks of bleeding, infection and bladder injury Will plan on post resection intravesical gemcitabine    Riki Altes, MD  [facility] [address] [place], [place] [address] [phone]

## 2023-10-28 NOTE — Progress Notes (Signed)
   10/28/23  CC:  Chief Complaint  Patient presents with   Cysto    Urologic history: TURBT 07/17/2020; >5 cm papillary tumor Pathology low-grade Ta urothelial carcinoma Completed 6-week course intravesical gemcitabine October 2021 for intermediate risk disease  HPI: Mr. Letourneau has no complaints today.  Denies dysuria or gross hematuria  Blood pressure (!) 156/81, pulse 71, height 5\' 11"  (1.803 m), weight 168 lb (76.2 kg).   Cystoscopy Procedure Note  Patient identification was confirmed, informed consent was obtained, and patient was prepped using Betadine solution.  Lidocaine jelly was administered per urethral meatus.     Pre-Procedure: - Inspection reveals a normal caliber urethral meatus.  Procedure: The flexible cystoscope was introduced without difficulty - No urethral strictures/lesions are present. - Coapting lateral lobes -  Large median lobe   - Bilateral ureteral orifices identified - Bladder mucosa papillary tumor right superolateral wall measuring ~ 3 cm - No bladder stones - Moderate trabeculation  Retroflexion shows large, intravesical median lobe   Post-Procedure: - Patient tolerated the procedure well  Assessment/ Plan: Recurrent papillary tumor Findings were discussed and recommend scheduling TURBT The procedure was discussed including potential risks of bleeding, infection and bladder injury.  All questions were answered and he desires to schedule   Riki Altes, MD

## 2023-11-02 ENCOUNTER — Other Ambulatory Visit: Payer: Self-pay

## 2023-11-02 DIAGNOSIS — D494 Neoplasm of unspecified behavior of bladder: Secondary | ICD-10-CM

## 2023-11-02 NOTE — Progress Notes (Unsigned)
Surgical Physician Order Form [facility]  Dr. Irineo Axon, MD  * Scheduling expectation : pt pref ok to wait until after holidays if desired  *Length of Case: 30 min  *Clearance needed: yes  *Anticoagulation Instructions: Hold all anticoagulants pradaxa  *Aspirin Instructions: N/A  *Post-op visit Date/Instructions:  1-2 week with pathology review  *Diagnosis: Bladder Tumor  *Procedure:  TURBT <2cm ([address])   Additional orders: Gemcitabine 2000mg  bladder instillation  -Admit type: OUTpatient  -Anesthesia: Choice  -VTE Prophylaxis Standing Order SCD's       Other:   -Standing Lab Orders Per Anesthesia    Lab other: UA&Urine Culture  -Standing Test orders EKG/Chest x-ray per Anesthesia       Test other:   - Medications:  Ancef 2gm IV  -Other orders:  Ok to proceed with Ancef PCN allergy reviewed

## 2023-11-03 ENCOUNTER — Telehealth: Payer: Self-pay

## 2023-11-03 NOTE — Progress Notes (Signed)
  Phone Number: [phone] for Surgical Coordinator Fax Number: [phone]  REQUEST FOR SURGICAL CLEARANCE       Date: Date: 11/03/23  Faxed to: Dr. Judithann Sheen  Surgeon: Dr. Irineo Axon, MD     Date of Surgery: December 31st, 2024  Operation:  Transurethral Resection of Bladder Tumor with Intravesical Instillation of Gemcitabine   Anesthesia Type: General   Diagnosis: Bladder Tumor  Patient Requires:   Medical Clearance : Yes  Reason: Would like for patient to hold Pradaxa, how many days do you recommend?   Risk Assessment:    Low   []       Moderate   []     High   []           This patient is optimized for surgery  YES []       NO   []    I recommend further assessment/workup prior to surgery. YES []      NO  []   Appointment scheduled for: _______________________   Further recommendations: ____________________________________     Physician Signature:__________________________________   Printed Name: ________________________________________   Date: _________________

## 2023-11-03 NOTE — Telephone Encounter (Signed)
  Per Dr. Lonna Cobb, Patient is to be scheduled for  Transurethral Resection of Bladder Tumor with Intravesical Instillation of Gemcitabine   Darren Wright was contacted and possible surgical dates were discussed, Tuesday December 31st, 2024 was agreed upon for surgery.   Patient was instructed that Dr. Lonna Cobb will require them to provide a pre-op UA & CX prior to surgery. This was ordered and scheduled drop off appointment was made for 11/16/2023.    Patient was directed to call [phone] between 1-3pm the day before surgery to find out surgical arrival time.  Instructions were given not to eat or drink from midnight on the night before surgery and have a driver for the day of surgery. On the surgery day patient was instructed to enter through the Medical Mall entrance of [facility] report the Same Day Surgery desk.   Pre-Admit Testing will be in contact via phone to set up an interview with the anesthesia team to review your history and medications prior to surgery.   Reminder of this information was given to the patient.   Patient is to hold anticoag's per Dr. Lonna Cobb.  Currently taking Pradaxa by Dr. Judithann Sheen. Clearance requested Awaiting response

## 2023-11-03 NOTE — Progress Notes (Signed)
   [facility] Surgical Posting Form  Surgery Date: Date: 12/01/2023  Surgeon: Dr. Irineo Axon, MD  Inpt ( No  )   Outpt (Yes)   Obs ( No  )   Diagnosis: N49.4 Bladder Tumor  -CPT: 10272, [phone]  Surgery: Transurethral Resection of Bladder Tumor with Intravesical Instillation of Gemcitabine  Stop Anticoagulations: Yes, will need to hold Pradaxa  Cardiac/Medical/Pulmonary Clearance needed: yes  Clearance needed from Dr: Judithann Sheen  Clearance request sent on: Date: 11/03/23   *Orders entered into EPIC  Date: 11/03/23   *Case booked in EPIC  Date: 11/03/23  *Notified pt of Surgery: Date: 11/03/23  PRE-OP UA & CX: yes, will obtain in clinic on 11/16/2023  *Placed into Prior Authorization Work Angela Nevin Date: 11/03/23  Assistant/laser/rep:No

## 2023-11-04 ENCOUNTER — Telehealth: Payer: Self-pay

## 2023-11-04 NOTE — Telephone Encounter (Signed)
Surgical Clearance received and patient informed to hold Pradaxa 3 days prior to surgery. Patient verbalized understanding.

## 2023-11-10 DIAGNOSIS — H353211 Exudative age-related macular degeneration, right eye, with active choroidal neovascularization: Secondary | ICD-10-CM | POA: Diagnosis not present

## 2023-11-12 DIAGNOSIS — Z961 Presence of intraocular lens: Secondary | ICD-10-CM | POA: Diagnosis not present

## 2023-11-12 DIAGNOSIS — H401111 Primary open-angle glaucoma, right eye, mild stage: Secondary | ICD-10-CM | POA: Diagnosis not present

## 2023-11-12 DIAGNOSIS — H40002 Preglaucoma, unspecified, left eye: Secondary | ICD-10-CM | POA: Diagnosis not present

## 2023-11-16 ENCOUNTER — Other Ambulatory Visit: Payer: PPO

## 2023-11-16 DIAGNOSIS — D494 Neoplasm of unspecified behavior of bladder: Secondary | ICD-10-CM | POA: Diagnosis not present

## 2023-11-16 LAB — URINALYSIS, COMPLETE
Bilirubin, UA: NEGATIVE
Glucose, UA: NEGATIVE
Ketones, UA: NEGATIVE
Nitrite, UA: NEGATIVE
Protein,UA: NEGATIVE
Specific Gravity, UA: 1.015 (ref 1.005–1.030)
Urobilinogen, Ur: 0.2 mg/dL (ref 0.2–1.0)
pH, UA: 6 (ref 5.0–7.5)

## 2023-11-16 LAB — MICROSCOPIC EXAMINATION
RBC, Urine: >30 /[HPF] — AB (ref 0–2)
WBC, UA: >30 /[HPF] — AB (ref 0–5)

## 2023-11-18 LAB — CULTURE, URINE COMPREHENSIVE

## 2023-11-23 ENCOUNTER — Other Ambulatory Visit: Payer: Self-pay

## 2023-11-23 ENCOUNTER — Telehealth: Payer: Self-pay

## 2023-11-23 ENCOUNTER — Encounter
Admission: RE | Admit: 2023-11-23 | Discharge: 2023-11-23 | Disposition: A | Discharge status: Home / Self Care | Payer: PPO | Source: Ambulatory Visit | Attending: Urology | Admitting: Urology

## 2023-11-23 DIAGNOSIS — E538 Deficiency of other specified B group vitamins: Secondary | ICD-10-CM | POA: Diagnosis not present

## 2023-11-23 DIAGNOSIS — I10 Essential (primary) hypertension: Secondary | ICD-10-CM | POA: Diagnosis not present

## 2023-11-23 DIAGNOSIS — Z01810 Encounter for preprocedural cardiovascular examination: Secondary | ICD-10-CM | POA: Diagnosis not present

## 2023-11-23 DIAGNOSIS — Z01812 Encounter for preprocedural laboratory examination: Secondary | ICD-10-CM

## 2023-11-23 DIAGNOSIS — Z01818 Encounter for other preprocedural examination: Secondary | ICD-10-CM | POA: Diagnosis not present

## 2023-11-23 HISTORY — DX: Anemia, unspecified: D64.9

## 2023-11-23 HISTORY — DX: Neoplasm of unspecified behavior of bladder: D49.4

## 2023-11-23 LAB — BASIC METABOLIC PANEL
Anion gap: 8 (ref 5–15)
BUN: 23 mg/dL (ref 8–23)
CO2: 27 mmol/L (ref 22–32)
Calcium: 8.9 mg/dL (ref 8.9–10.3)
Chloride: 101 mmol/L (ref 98–111)
Creatinine, Ser: 0.93 mg/dL (ref 0.61–1.24)
GFR, Estimated: >60 mL/min (ref >60)
Glucose, Bld: 82 mg/dL (ref 70–99)
Potassium: 4.2 mmol/L (ref 3.5–5.1)
Sodium: 136 mmol/L (ref 135–145)

## 2023-11-23 LAB — CBC
HCT: 40 % (ref 39.0–52.0)
Hemoglobin: 12.4 g/dL — ABNORMAL LOW (ref 13.0–17.0)
MCH: 25.1 pg — ABNORMAL LOW (ref 26.0–34.0)
MCHC: 31 g/dL (ref 30.0–36.0)
MCV: 81 fL (ref 80.0–100.0)
Platelets: 242 10*3/uL (ref 150–400)
RBC: 4.94 MIL/uL (ref 4.22–5.81)
RDW: 15.4 % (ref 11.5–15.5)
WBC: 6.1 10*3/uL (ref 4.0–10.5)
nRBC: 0 % (ref 0.0–0.2)

## 2023-11-23 MED ORDER — CIPROFLOXACIN HCL 250 MG PO TABS: 250.0000 mg | ORAL_TABLET | Freq: Two times a day (BID) | ORAL | 0 refills | Status: DC

## 2023-11-23 NOTE — Telephone Encounter (Signed)
-----   Message from Verna Czech [facility] sent at 11/20/2023  8:01 AM EST ----- Preop urine culture was positive.  If he is not having any UTI symptoms would recommend starting antibiotic 5 days prior to his surgery date.  Can send in Cipro to 50 mg twice daily x 7 days.  Change preop IV antibiotic to Cipro 400 mg.  Thanks

## 2023-11-23 NOTE — Patient Instructions (Addendum)
Your procedure is scheduled on:        Tuesday, 12/01/2023  Report to the Registration Desk on the 1st floor of the Medical Mall. To find out your arrival time, please call [phone] between 1PM - 3PM on: Monday, 12/30  If your arrival time is 6:00 am, do not arrive before that time as the Medical Mall entrance doors do not open until 6:00 am.  REMEMBER: Instructions that are not followed completely may result in serious medical risk, up to and including death; or upon the discretion of your surgeon and anesthesiologist your surgery may need to be rescheduled.  Do not eat food after midnight the night before surgery.  No gum chewing or hard candies.   One week prior to surgery: Stop Anti-inflammatories (NSAIDS) such as Advil, Aleve, Ibuprofen, Motrin, Naproxen, Naprosyn and Aspirin based products such as Excedrin, Goody's Powder, BC Powder. Stop ANY OVER THE COUNTER supplements until after surgery.  You may however, continue to take Tylenol if needed for pain up until the day of surgery.   **Follow recommendations regarding stopping blood thinners.**  Hold Pradaxa 3 days prior to surgery.   Last Dose should be Dec 27 ,Friday)  Continue taking all of your other prescription medications up until the day of surgery.  ON THE DAY OF SURGERY ONLY TAKE THESE MEDICATIONS WITH SIPS OF WATER:  ciprofloxacin (CIPRO) pantoprazole (PROTONIX) tamsulosin (FLOMAX)      No Alcohol for 24 hours before or after surgery.  No Smoking including e-cigarettes for 24 hours before surgery.  No chewable tobacco products for at least 6 hours before surgery.  No nicotine patches on the day of surgery.  Do not use any "recreational" drugs for at least a week (preferably 2 weeks) before your surgery.  Please be advised that the combination of cocaine and anesthesia may have negative outcomes, up to and including death. If you test positive for cocaine, your surgery will be cancelled.  On the  morning of surgery brush your teeth with toothpaste and water, you may rinse your mouth with mouthwash if you wish. Do not swallow any toothpaste or mouthwash.  Please shower on day of surgery.  Do not wear jewelry, make-up, hairpins, clips or nail polish.  For welded (permanent) jewelry: bracelets, anklets, waist bands, etc.  Please have this removed prior to surgery.  If it is not removed, there is a chance that hospital personnel will need to cut it off on the day of surgery.  Do not wear lotions, powders, or perfumes.   Do not shave body hair from the neck down 48 hours before surgery.  Contact lenses, hearing aids and dentures may not be worn into surgery.  Do not bring valuables to the hospital. [facility] is not responsible for any missing/lost belongings or valuables.    Notify your doctor if there is any change in your medical condition (cold, fever, infection).  Wear comfortable clothing (specific to your surgery type) to the hospital.  After surgery, you can help prevent lung complications by doing breathing exercises.    If you are being discharged the day of surgery, you will not be allowed to drive home. You will need a responsible individual to drive you home and stay with you for 24 hours after surgery.    Please call the Pre-admissions Testing Dept. at [phone] if you have any questions about these instructions.  Surgery Visitation Policy:  Patients having surgery or a procedure may have two visitors.  Children  under the age of 92 must have an adult with them who is not the patient.

## 2023-11-23 NOTE — Telephone Encounter (Signed)
Called patient. Left detailed message about needing to start Abx prior to surgery due to positive culture. I have sent Rx in to [facility]. Patient made aware to start this medication on the 26th. Advised patient that I would call back to make sure he got this when we re-open on 12/26.

## 2023-11-27 NOTE — Pre-Procedure Instructions (Signed)
Medical Clearance on chart from Dr Adline Peals risk

## 2023-11-27 NOTE — Pre-Procedure Instructions (Signed)
Progress Notes - documented in this encounter  Ladean Raya, PA - 07/15/2023 10:00 AM EDT Formatting of this note is different from the original. Established Patient Visit  Chief Complaint: Chief Complaint Patient presents with Hypertension Bradycardia 6 mo Date of Service: 07/15/2023 Date of Birth: 1929-02-07 PCP: Marguarite Arbour, MD History of Present Illness: Mr. Darren Wright is a 89 y.o.male patient who returns for  1. Paroxysmal atrial fibrillation 2. Essential hypertension 3. Hyperlipidemia 4. COPD  The patient returns today for 85-month follow-up, and reports doing well with no complaints. He denies chest pain or shortness of breath. He denies palpitations or heart racing. He denies peripheral edema. The patient is active for his age.  Stress echocardiogram 04/13/2014 revealed normal left ventricular function, without evidence for scar or ischemia.  The patient has paroxysmal atrial fibrillation, chads vasc score 3, currently on Pradaxa for stroke prevention, which is tolerated well without apparent side effects. The patient denies nose bleeds, melena, or hematochezia.  The patient has essential hypertension, blood pressure mildly elevated today, currently on losartan/HCTZ, which is well-tolerated without apparent side effects. He reports systolic blood pressures typically in the 130s at home.  The patient has hyperlipidemia, LDL cholesterol was 61 on 1/61/0960, on pravastatin.  Past Medical and Surgical History Past Medical History Past Medical History: Diagnosis Date Allergic state Angioedema BPH (benign prostatic hyperplasia) COPD (chronic obstructive pulmonary disease) (CMS/HHS-HCC) DDD (degenerative disc disease), cervical GERD (gastroesophageal reflux disease) History of colon polyps Hyperlipidemia Hypertension Macular degeneration Osteoarthritis Peptic ulcer  Past Surgical History He has a past surgical history that includes Tonsillectomy; neuroplasty  vocal cord; Colonoscopy (12/01/2005); and robot assisted laparoscopic repair inguinal hernia (Bilateral, 02/12/2023).  Medications and Allergies Current Medications  Current Outpatient Medications Medication Sig Dispense Refill cyanocobalamin (VITAMIN B12) 1000 MCG tablet Take 1,000 mcg by mouth once daily. losartan-hydroCHLOROthiazide (HYZAAR) 50-12.5 mg tablet TAKE 1 TABLET BY MOUTH DAILY 90 tablet 3 pantoprazole (PROTONIX) 40 MG DR tablet take 1 tablet by mouth once daily. 90 tablet 1 PRADAXA 150 mg capsule TAKE 1 CAPSULE BY MOUTH TWICE DAILY 180 capsule 1 tamsulosin (FLOMAX) 0.4 mg capsule TAKE ONE CAPSULE DAILY 30 MINUTES AFTER THE SAME MEAL EACH DAY 90 capsule 3 timoloL maleate (TIMOPTIC) 0.5 % ophthalmic solution VIT C/E/ZN/COPPR/LUTEIN/ZEAXAN (PRESERVISION AREDS 2 ORAL) Take by mouth.  No current facility-administered medications for this visit.  Allergies: Ibuprofen and Penicillins  Social and Family History Social History reports that he quit smoking about 39 years ago. His smoking use included cigarettes. He has never used smokeless tobacco. He reports that he does not drink alcohol and does not use drugs.  Family History Family History Problem Relation Name Age of Onset Breast cancer Other sibling Cancer Other sibling bladder Pancreatic cancer Mother Prostate cancer Father  Review of Systems  Review of Systems: The patient denies chest pain, shortness of breath, orthopnea, paroxysmal nocturnal dyspnea, pedal edema, palpitations, heart racing, presyncope, syncope, with intermittent low energy. Review of 10 Systems is negative except as described above.  Physical Examination  Vitals: BP (!) 140/70  Pulse 73  Ht 177.8 cm (5\' 10" )  Wt 73.9 kg (163 lb)  SpO2 97%  BMI 23.39 kg/m Ht:177.8 cm (5\' 10" ) Wt:73.9 kg (163 lb) AVW:UJWJ surface area is 1.91 meters squared. Body mass index is 23.39 kg/m.  General: Alert and oriented. Well-appearing. No acute  distress. HEENT: Pupils equally reactive to light and accomodation Neck: Supple, no JVD Lungs: Normal effort of breathing; clear to auscultation bilaterally; no wheezes, rales, rhonchi  Heart: Regular rate and rhythm No murmur, rub, or gallop Abdomen: nondistended, with normal bowel sounds Extremities: no cyanosis, clubbing, or edema Peripheral Pulses: 2+ radial Skin: Warm, dry, no diaphoresis  Assessment  89 y.o. male with 1. Paroxysmal atrial fibrillation (CMS/HHS-HCC) 2. Primary hypertension 3. Bradycardia, sinus 4. Panlobular emphysema (CMS/HHS-HCC) 5. Mixed hyperlipidemia  89 year old gentleman with paroxysmal atrial fibrillation, chads vasc score 3, currently on Pradaxa, which is tolerated well without bleeding side effects. He appears asymptomatic.The patient has essential hypertension, blood pressure mildly elevated today, though better controlled at home, on current BP medications. The patient has hyperlipidemia, with good control of LDL cholesterol on diet therapy.  Plan  1. Continue current medications 2. Continue Pradaxa for stroke prevention 3. Continue low-sodium diet 4. Recommend Mediterranean Diet 5. Continue regular exercise as tolerated 6. Return to clinic for follow-up in 6 months  No orders of the defined types were placed in this encounter.  Return in about 6 months (around 01/15/2024).  Attestation Statement:  I personally performed the service, non-incident to. [facility])  ANNA Domenica Reamer, PA-C  Electronically signed by Ladean Raya, PA at 07/15/2023 12:34 PM EDT

## 2023-11-30 MED ORDER — CHLORHEXIDINE GLUCONATE 0.12 % MT SOLN
15.0000 mL | Freq: Once | OROMUCOSAL | Status: AC
  Administered 2023-12-01: 15 mL via OROMUCOSAL

## 2023-11-30 MED ORDER — LACTATED RINGERS IV SOLN: INTRAVENOUS | Status: DC

## 2023-11-30 MED ORDER — GEMCITABINE CHEMO FOR BLADDER INSTILLATION 2000 MG
2000.0000 mg | Freq: Once | INTRAVENOUS | Status: DC
Start: 2023-11-30 — End: 2023-12-01
  Filled 2023-11-30: qty 52.6

## 2023-11-30 MED ORDER — CEFAZOLIN SODIUM-DEXTROSE 2-4 GM/100ML-% IV SOLN
2.0000 g | INTRAVENOUS | Status: AC
  Administered 2023-12-01: 2 g via INTRAVENOUS

## 2023-11-30 MED ORDER — ORAL CARE MOUTH RINSE: 15.0000 mL | Freq: Once | OROMUCOSAL | Status: AC

## 2023-12-01 ENCOUNTER — Ambulatory Visit: Payer: PPO | Admitting: Registered Nurse

## 2023-12-01 ENCOUNTER — Encounter: Payer: Self-pay | Admitting: Urology

## 2023-12-01 ENCOUNTER — Ambulatory Visit
Admission: RE | Admit: 2023-12-01 | Discharge: 2023-12-01 | Disposition: A | Discharge status: Home / Self Care | Payer: PPO | Attending: Urology | Admitting: Urology

## 2023-12-01 ENCOUNTER — Other Ambulatory Visit: Payer: Self-pay

## 2023-12-01 ENCOUNTER — Encounter
Admission: RE | Disposition: A | Discharge status: Home / Self Care | Payer: Self-pay | Source: Home / Self Care | Attending: Urology

## 2023-12-01 DIAGNOSIS — C674 Malignant neoplasm of posterior wall of bladder: Secondary | ICD-10-CM | POA: Diagnosis not present

## 2023-12-01 DIAGNOSIS — D494 Neoplasm of unspecified behavior of bladder: Secondary | ICD-10-CM | POA: Diagnosis not present

## 2023-12-01 DIAGNOSIS — I480 Paroxysmal atrial fibrillation: Secondary | ICD-10-CM | POA: Diagnosis not present

## 2023-12-01 DIAGNOSIS — I10 Essential (primary) hypertension: Secondary | ICD-10-CM | POA: Insufficient documentation

## 2023-12-01 DIAGNOSIS — Z01812 Encounter for preprocedural laboratory examination: Secondary | ICD-10-CM

## 2023-12-01 DIAGNOSIS — E538 Deficiency of other specified B group vitamins: Secondary | ICD-10-CM

## 2023-12-01 DIAGNOSIS — Z87891 Personal history of nicotine dependence: Secondary | ICD-10-CM | POA: Diagnosis not present

## 2023-12-01 DIAGNOSIS — C672 Malignant neoplasm of lateral wall of bladder: Secondary | ICD-10-CM | POA: Diagnosis not present

## 2023-12-01 DIAGNOSIS — C679 Malignant neoplasm of bladder, unspecified: Secondary | ICD-10-CM | POA: Diagnosis not present

## 2023-12-01 HISTORY — PX: BLADDER INSTILLATION: SHX6893

## 2023-12-01 HISTORY — PX: TRANSURETHRAL RESECTION OF BLADDER TUMOR: SHX2575

## 2023-12-01 SURGERY — TURBT (TRANSURETHRAL RESECTION OF BLADDER TUMOR)
Anesthesia: General

## 2023-12-01 MED ORDER — KETOROLAC TROMETHAMINE 30 MG/ML IJ SOLN
INTRAMUSCULAR | Status: AC
  Filled 2023-12-01: qty 1

## 2023-12-01 MED ORDER — KETOROLAC TROMETHAMINE 30 MG/ML IJ SOLN
INTRAMUSCULAR | Status: DC | PRN
  Administered 2023-12-01: 30 mg via INTRAVENOUS

## 2023-12-01 MED ORDER — EPHEDRINE SULFATE-NACL 50-0.9 MG/10ML-% IV SOSY
PREFILLED_SYRINGE | INTRAVENOUS | Status: DC | PRN
  Administered 2023-12-01: 5 mg via INTRAVENOUS

## 2023-12-01 MED ORDER — FENTANYL CITRATE (PF) 100 MCG/2ML IJ SOLN
25.0000 ug | INTRAMUSCULAR | Status: DC | PRN
Start: 2023-12-01 — End: 2023-12-01

## 2023-12-01 MED ORDER — ONDANSETRON HCL 4 MG/2ML IJ SOLN: 4.0000 mg | Freq: Once | INTRAMUSCULAR | Status: DC | PRN

## 2023-12-01 MED ORDER — CHLORHEXIDINE GLUCONATE 0.12 % MT SOLN
OROMUCOSAL | Status: AC
  Filled 2023-12-01: qty 15

## 2023-12-01 MED ORDER — CEFAZOLIN SODIUM-DEXTROSE 2-4 GM/100ML-% IV SOLN
INTRAVENOUS | Status: AC
  Filled 2023-12-01: qty 100

## 2023-12-01 MED ORDER — FENTANYL CITRATE (PF) 100 MCG/2ML IJ SOLN
INTRAMUSCULAR | Status: DC | PRN
  Administered 2023-12-01: 50 ug via INTRAVENOUS

## 2023-12-01 MED ORDER — PROPOFOL 10 MG/ML IV BOLUS
INTRAVENOUS | Status: DC | PRN
  Administered 2023-12-01: 50 mg via INTRAVENOUS
  Administered 2023-12-01: 150 mg via INTRAVENOUS

## 2023-12-01 MED ORDER — GEMCITABINE CHEMO FOR BLADDER INSTILLATION 2000 MG
INTRAVENOUS | Status: DC | PRN
  Administered 2023-12-01: 2000 mg via INTRAVESICAL

## 2023-12-01 MED ORDER — DEXAMETHASONE SODIUM PHOSPHATE 10 MG/ML IJ SOLN
INTRAMUSCULAR | Status: DC | PRN
  Administered 2023-12-01: 10 mg via INTRAVENOUS

## 2023-12-01 MED ORDER — PROPOFOL 10 MG/ML IV BOLUS
INTRAVENOUS | Status: AC
  Filled 2023-12-01: qty 20

## 2023-12-01 MED ORDER — PHENYLEPHRINE 80 MCG/ML (10ML) SYRINGE FOR IV PUSH (FOR BLOOD PRESSURE SUPPORT)
PREFILLED_SYRINGE | INTRAVENOUS | Status: AC
  Filled 2023-12-01: qty 10

## 2023-12-01 MED ORDER — LIDOCAINE HCL (CARDIAC) PF 100 MG/5ML IV SOSY
PREFILLED_SYRINGE | INTRAVENOUS | Status: DC | PRN
  Administered 2023-12-01: 100 mg via INTRAVENOUS

## 2023-12-01 MED ORDER — EPHEDRINE 5 MG/ML INJ
INTRAVENOUS | Status: AC
  Filled 2023-12-01: qty 5

## 2023-12-01 MED ORDER — SODIUM CHLORIDE 0.9 % IR SOLN
Status: DC | PRN
  Administered 2023-12-01: 1 via INTRAVESICAL

## 2023-12-01 MED ORDER — ONDANSETRON HCL 4 MG/2ML IJ SOLN
INTRAMUSCULAR | Status: DC | PRN
  Administered 2023-12-01: 4 mg via INTRAVENOUS

## 2023-12-01 MED ORDER — PHENYLEPHRINE 80 MCG/ML (10ML) SYRINGE FOR IV PUSH (FOR BLOOD PRESSURE SUPPORT)
PREFILLED_SYRINGE | INTRAVENOUS | Status: DC | PRN
  Administered 2023-12-01: 160 ug via INTRAVENOUS
  Administered 2023-12-01: 80 ug via INTRAVENOUS
  Administered 2023-12-01 (×2): 160 ug via INTRAVENOUS

## 2023-12-01 MED ORDER — LACTATED RINGERS IV SOLN: INTRAVENOUS | Status: DC | PRN

## 2023-12-01 MED ORDER — FENTANYL CITRATE (PF) 100 MCG/2ML IJ SOLN
INTRAMUSCULAR | Status: AC
  Filled 2023-12-01: qty 2

## 2023-12-01 SURGICAL SUPPLY — 22 items
BAG DRAIN SIEMENS DORNER NS (MISCELLANEOUS) ×1 IMPLANT
BAG URINE DRAIN 2000ML AR STRL (UROLOGICAL SUPPLIES) ×1 IMPLANT
CATH FOLEY 2WAY 18X30 (CATHETERS) IMPLANT
CATH FOLEY 2WAY SIL 18X30 (CATHETERS)
DRAPE UTILITY 15X26 TOWEL STRL (DRAPES) ×1 IMPLANT
DRSG TELFA 3X4 N-ADH STERILE (GAUZE/BANDAGES/DRESSINGS) ×1 IMPLANT
ELECT LOOP 22F BIPOLAR SML (ELECTROSURGICAL) ×1
ELECT REM PT RETURN 9FT ADLT (ELECTROSURGICAL)
ELECTRODE LOOP 22F BIPOLAR SML (ELECTROSURGICAL) IMPLANT
ELECTRODE REM PT RTRN 9FT ADLT (ELECTROSURGICAL) IMPLANT
GLOVE BIOGEL PI IND STRL 7.5 (GLOVE) ×1 IMPLANT
GOWN STRL REUS W/ TWL LRG LVL3 (GOWN DISPOSABLE) ×1 IMPLANT
GOWN STRL REUS W/TWL XL LVL4 (GOWN DISPOSABLE) ×1 IMPLANT
IV NS IRRIG 3000ML ARTHROMATIC (IV SOLUTION) ×2 IMPLANT
KIT TURNOVER CYSTO (KITS) ×1 IMPLANT
LOOP CUT BIPOLAR 24F LRG (ELECTROSURGICAL) IMPLANT
PACK CYSTO AR (MISCELLANEOUS) ×1 IMPLANT
SET IRRIG Y TYPE TUR BLADDER L (SET/KITS/TRAYS/PACK) ×1 IMPLANT
SURGILUBE 2OZ TUBE FLIPTOP (MISCELLANEOUS) ×1 IMPLANT
SYR TOOMEY IRRIG 70ML (MISCELLANEOUS) ×1
SYRINGE TOOMEY IRRIG 70ML (MISCELLANEOUS) ×1 IMPLANT
WATER STERILE IRR 500ML POUR (IV SOLUTION) ×1 IMPLANT

## 2023-12-01 NOTE — Interval H&P Note (Signed)
 History and Physical Interval Note:  12/01/2023 8:09 AM  Darren Wright  has presented today for surgery, with the diagnosis of Bladder Tumor.  The various methods of treatment have been discussed with the patient and family. After consideration of risks, benefits and other options for treatment, the patient has consented to  Procedure(s): TRANSURETHRAL RESECTION OF BLADDER TUMOR (TURBT) (N/A) BLADDER INSTILLATION OF GEMCITABINE  (N/A) as a surgical intervention.  The patient's history has been reviewed, patient examined, no change in status, stable for surgery.  I have reviewed the patient's chart and labs.  Questions were answered to the patient's satisfaction.    CV:RRR Lungs:clear  Glendia JAYSON Barba

## 2023-12-01 NOTE — Anesthesia Preprocedure Evaluation (Signed)
 Anesthesia Evaluation  Patient identified by MRN, date of birth, ID band Patient awake    Reviewed: Allergy & Precautions, H&P , NPO status , Patient's Chart, lab work & pertinent test results  History of Anesthesia Complications Negative for: history of anesthetic complications  Airway Mallampati: III  TM Distance: >3 FB Neck ROM: limited    Dental  (+) Chipped, Dental Advidsory Given   Pulmonary neg shortness of breath, neg recent URI, former smoker   Pulmonary exam normal        Cardiovascular Exercise Tolerance: Good hypertension, + dysrhythmias Atrial Fibrillation      Neuro/Psych negative neurological ROS  negative psych ROS   GI/Hepatic Neg liver ROS,GERD  Medicated and Controlled,,  Endo/Other  negative endocrine ROS    Renal/GU      Musculoskeletal   Abdominal   Peds  Hematology negative hematology ROS (+)   Anesthesia Other Findings Past Medical History: No date: A-fib (HCC) 12/11/2016: Basal cell carcinoma     Comment:  left medial cheek No date: GERD (gastroesophageal reflux disease) No date: Hypertension 03/10/2018: Squamous cell carcinoma of skin     Comment:  left crown scalp  Past Surgical History: No date: CHOLECYSTECTOMY No date: TONSILLECTOMY  BMI    Body Mass Index: 23.15 kg/m      Reproductive/Obstetrics negative OB ROS                             Anesthesia Physical Anesthesia Plan  ASA: 3  Anesthesia Plan: General   Post-op Pain Management:    Induction: Intravenous  PONV Risk Score and Plan: Dexamethasone , Ondansetron  and Treatment may vary due to age or medical condition  Airway Management Planned: LMA  Additional Equipment:   Intra-op Plan:   Post-operative Plan: Extubation in OR  Informed Consent: I have reviewed the patients History and Physical, chart, labs and discussed the procedure including the risks, benefits and alternatives  for the proposed anesthesia with the patient or authorized representative who has indicated his/her understanding and acceptance.     Dental Advisory Given  Plan Discussed with: Anesthesiologist, CRNA and Surgeon  Anesthesia Plan Comments: (Patient consented for risks of anesthesia including but not limited to:  - adverse reactions to medications - damage to eyes, teeth, lips or other oral mucosa - nerve damage due to positioning  - sore throat or hoarseness - Damage to heart, brain, nerves, lungs, other parts of body or loss of life  Patient voiced understanding.)        Anesthesia Quick Evaluation

## 2023-12-01 NOTE — Discharge Instructions (Signed)
 Transurethral Resection of Bladder Tumor (TURBT) or Bladder Biopsy   Definition:  Transurethral Resection of the Bladder Tumor is a surgical procedure used to diagnose and remove tumors within the bladder.   General instructions:     Your recent bladder surgery requires very little post hospital care but some definite precautions.  Despite the fact that no skin incisions were used, the area around the bladder incisions are raw and covered with scabs to promote healing and prevent bleeding. Certain precautions are needed to insure that the scabs are not disturbed over the next 2-4 weeks while the healing proceeds.  Because the raw surface inside your bladder and the irritating effects of urine you may expect frequency of urination and/or urgency (a stronger desire to urinate) and perhaps even getting up at night more often. This will usually resolve or improve slowly over the healing period. You may see some blood in your urine over the first 6 weeks. Do not be alarmed, even if the urine was clear for a while. Get off your feet and drink lots of fluids until clearing occurs. If you start to pass clots or don't improve call us.  Diet:  You may return to your normal diet immediately. Because of the raw surface of your bladder, alcohol, spicy foods, foods high in acid and drinks with caffeine may cause irritation or frequency and should be used in moderation. To keep your urine flowing freely and avoid constipation, drink plenty of fluids during the day (8-10 glasses). Tip: Avoid cranberry juice because it is very acidic.  Activity:  Your physical activity doesn't need to be restricted. However, if you are very active, you may see some blood in the urine. We suggest that you reduce your activity under the circumstances until the bleeding has stopped.  Bowels:  It is important to keep your bowels regular during the postoperative period. Straining with bowel movements can cause bleeding. A bowel  movement every other day is reasonable. Use a mild laxative if needed, such as milk of magnesia 2-3 tablespoons, or 2 Dulcolax tablets. Call if you continue to have problems. If you had been taking narcotics for pain, before, during or after your surgery, you may be constipated. Take a laxative if necessary.    Medication:  You should resume your pre-surgery medications unless told not to. In addition you may be given an antibiotic to prevent or treat infection. Antibiotics are not always necessary. All medication should be taken as prescribed until the bottles are finished unless you are having an unusual reaction to one of the drugs.   [facility] [address] [place], [place] [address] [phone]

## 2023-12-01 NOTE — Transfer of Care (Signed)
 2Immediate Anesthesia Transfer of Care Note  Patient: Darren Wright  Procedure(s) Performed: TRANSURETHRAL RESECTION OF BLADDER TUMOR (TURBT) BLADDER INSTILLATION OF GEMCITABINE   Patient Location: PACU  Anesthesia Type:General  Level of Consciousness: drowsy  Airway & Oxygen Therapy: Patient Spontanous Breathing and Patient connected to face mask oxygen  Post-op Assessment: Report given to RN and Post -op Vital signs reviewed and stable  Post vital signs: Reviewed and stable  Last Vitals:  Vitals Value Taken Time  BP 137/73 12/01/23 0945  Temp 36.2 C 12/01/23 0915  Pulse 66 12/01/23 0952  Resp 11 12/01/23 0933  SpO2 92 % 12/01/23 0952  Vitals shown include unfiled device data.  Last Pain:  Vitals:   12/01/23 0945  TempSrc:   PainSc: 0-No pain         Complications: No notable events documented.

## 2023-12-01 NOTE — H&P (Signed)
 10/28/2023 7:33 AM   Darren Wright Oct 18, 1929 [id number]   HPI: Darren Wright is a 89 y.o. male with a history of low-grade urothelial carcinoma of the bladder.  On recent surveillance cystoscopy today he was found to have recurrent papillary tumor right lateral wall measuring ~ 3 cm.  TURBT was recommended.   PMH: Past Medical History:  Diagnosis Date   Anemia    Aortic atherosclerosis (HCC)    Basal cell carcinoma 12/11/2016   left medial cheek   Bladder cancer (HCC) 2021   Bladder tumor    BPH (benign prostatic hyperplasia)    Colon polyp    COPD (chronic obstructive pulmonary disease) (HCC)    DDD (degenerative disc disease), cervical    Diverticulosis    GERD (gastroesophageal reflux disease)    Hepatic cyst    Hiatal hernia    History of angioedema    HLD (hyperlipidemia)    Hypertension    Inguinal hernia    Long term current use of antithrombotics/antiplatelets    a.) dabigatran   Macular degeneration    Osteoarthritis    PAF (paroxysmal atrial fibrillation) (HCC)    a.) CHA2DS2VASc = 4 (age x2, HTN, vascular disease history);  b.) rate/rhythm maintained on oral metoprolol  succinate; chronically anticoagulated with dabigatran   PUD (peptic ulcer disease)    Squamous cell carcinoma of skin 03/10/2018   left crown scalp    Surgical History: Past Surgical History:  Procedure Laterality Date   CHOLECYSTECTOMY  2018   CYSTOSCOPY W/ RETROGRADES Bilateral 07/17/2020   Procedure: CYSTOSCOPY WITH RETROGRADE PYELOGRAM;  Surgeon: Twylla Darren BROCKS, MD;  Location: [facility] ORS;  Service: Urology;  Laterality: Bilateral;   NEUROPLASTY VOCAL CORD  Bilateral    ROBOT ASSISTED LAPAROSCOPIC REPAIR INGUINAL HERNIA Bilateral 02/12/2023  Bilateral    02/12/2023   TONSILLECTOMY     TRANSURETHRAL RESECTION OF BLADDER TUMOR WITH MITOMYCIN -C N/A 07/17/2020   Procedure: TRANSURETHRAL RESECTION OF BLADDER TUMOR WITH gemcitabine ;  Surgeon: Twylla Darren BROCKS, MD;  Location: [facility] ORS;   Service: Urology;  Laterality: N/A;    Home Medications:  Allergies as of 12/01/2023       Reactions   Ibuprofen Hives   Penicillins    Swelling 1 week after having injection. Unsure if it was really the cause.        Medication List      Notice   Cannot display discharge medications because the patient has not yet been admitted.     Allergies:  Allergies  Allergen Reactions   Ibuprofen Hives   Penicillins     Swelling 1 week after having injection. Unsure if it was really the cause.    Family History: Family History  Problem Relation Age of Onset   Pancreatic cancer Mother    Prostate cancer Father    Breast cancer Sister    Bladder Cancer Sister     Social History:  reports that he quit smoking about 40 years ago. His smoking use included cigarettes. He has never used smokeless tobacco. He reports that he does not currently use alcohol. He reports that he does not use drugs.   Physical Exam: Constitutional:  Alert and oriented, No acute distress. HEENT: [place] AT Respiratory: Normal respiratory effort, no increased work of breathing. Psychiatric: Normal mood and affect.   Assessment & Plan:    1. Bladder tumor Recurrent bladder tumor Findings were discussed in detail with Darren Wright and recommend TURBT The procedure was discussed including potential risks  of bleeding, infection and bladder injury Will plan on post resection intravesical gemcitabine     Darren JAYSON Barba, MD  [facility] [address] [place], [place] [address] [phone]

## 2023-12-01 NOTE — Anesthesia Procedure Notes (Signed)
 Procedure Name: LMA Insertion Date/Time: 12/01/2023 8:27 AM  Performed by: Allean Greig SAUNDERS, CRNAPre-anesthesia Checklist: Patient identified, Emergency Drugs available, Suction available and Patient being monitored Patient Re-evaluated:Patient Re-evaluated prior to induction Oxygen Delivery Method: Circle system utilized Preoxygenation: Pre-oxygenation with 100% oxygen Induction Type: IV induction Ventilation: Mask ventilation without difficulty LMA: LMA inserted LMA Size: 5.0 Number of attempts: 1 Placement Confirmation: positive ETCO2 and breath sounds checked- equal and bilateral Tube secured with: Tape Dental Injury: Teeth and Oropharynx as per pre-operative assessment

## 2023-12-01 NOTE — Op Note (Signed)
   Preoperative diagnosis: Bladder tumor  Postoperative diagnosis:  Bladder tumor   Procedure:  Cystoscopy Transurethral resection of bladder tumor (2-5 cm) Instillation intravesical gemcitabine   Surgeon: Glendia C. Lura Falor, M.D.  Anesthesia: General  Complications: None  Intraoperative findings:  Bladder tumor: Sessile papillary tumor right posterior mid posterior. Cystoscopy: Urethra normal in caliber without stricture; prominent lateral lobe enlargement with median lobe; hypervascularity.  Trabeculated bladder with cellules/small diverticula.  UOs normal-appearing bilaterally  EBL: Minimal  Specimens: Bladder tumor Base of bladder tumor      Indication: Darren Wright is a 89 y.o male with a history of low-grade urothelial carcinoma the bladder.  On recent surveillance cystoscopy he was found to have a recurrent papillary tumor measuring ~ 3 cm.  After reviewing the management options for treatment, he elected to proceed with the above surgical procedure(s). We have discussed the potential benefits and risks of the procedure, side effects of the proposed treatment, the likelihood of the patient achieving the goals of the procedure, and any potential problems that might occur during the procedure or recuperation. Informed consent has been obtained.  Description of procedure:  The patient was taken to the operating room and general anesthesia was induced.  The patient was placed in the dorsal lithotomy position, prepped and draped in the usual sterile fashion, and preoperative antibiotics were administered. A preoperative time-out was performed.   A 24 French continuous-flow resectoscope sheath with obturator was lubricated and advanced into the bladder without difficulty.  The obturator was replaced with an Faustine resectoscope with loop and panendoscopy was then performed with findings as described above.    Using bipolar current the tumor was resected in its entirety down to  superficial muscle.  Specimen was removed from the bladder via irrigation and sent for permanent pathologic analysis.   Separate biopsies were also taken of the underlying deep bladder tissue and sent as a separate specimen.   Hemostasis was then achieved with the loop cautery and the bladder was emptied and reinspected with no further bleeding noted at the end of the procedure.  An 60 French Foley catheter was placed to gravity drainage.  2000 mg of intravesical gemcitabine  was instilled to the bladder.  This was allowed to dwell in the PACU for 1 hour.  This was well-tolerated.  After 1 hour, the catheter was drained and removed.  The patient appeared to tolerate the procedure well and without complications.  After anesthetic reversal he was transported to the PACU in stable condition  Plan: He will be contacted with the pathology results and further recommendations will be provided at that time    Darren Jafari C. Twylla,  MD

## 2023-12-02 NOTE — Anesthesia Postprocedure Evaluation (Signed)
 Anesthesia Post Note  Patient: Darren Wright  Procedure(s) Performed: TRANSURETHRAL RESECTION OF BLADDER TUMOR (TURBT) BLADDER INSTILLATION OF GEMCITABINE   Patient location during evaluation: PACU Anesthesia Type: General Level of consciousness: awake and alert Pain management: pain level controlled Vital Signs Assessment: post-procedure vital signs reviewed and stable Respiratory status: spontaneous breathing, nonlabored ventilation, respiratory function stable and patient connected to nasal cannula oxygen Cardiovascular status: blood pressure returned to baseline and stable Postop Assessment: no apparent nausea or vomiting Anesthetic complications: no   No notable events documented.   Last Vitals:  Vitals:   12/01/23 1000 12/01/23 1014  BP: (!) 143/73 (!) 173/82  Pulse: 67 74  Resp:  15  Temp: (!) 36.3 C (!) 36.4 C  SpO2: 96% 96%    Last Pain:  Vitals:   12/01/23 1014  TempSrc: Temporal  PainSc: 0-No pain                 Prentice Murphy

## 2023-12-03 LAB — SURGICAL PATHOLOGY

## 2023-12-10 ENCOUNTER — Telehealth: Payer: Self-pay | Admitting: Urology

## 2023-12-10 NOTE — Telephone Encounter (Signed)
 Notified patient as instructed, patient pleased. Discussed follow-up appointments, patient agrees

## 2023-12-10 NOTE — Telephone Encounter (Signed)
 I contacted Darren Wright to discuss his pathology results.  Darren Wright had an episode of hematuria 48 hours after the procedure which resolved with increased hydration.  Presently without complaints.  Pathology showed a low-grade noninvasive urothelial carcinoma the bladder with negative muscle involvement  I discussed the results and Darren Wright received postresection gemcitabine .  We discussed no other treatment is needed at this time and Darren Wright will follow-up in 4 months for surveillance cystoscopy.

## 2023-12-14 ENCOUNTER — Ambulatory Visit: Payer: PPO | Admitting: Urology

## 2023-12-21 ENCOUNTER — Ambulatory Visit: Payer: PPO | Admitting: Urology

## 2023-12-22 DIAGNOSIS — H353221 Exudative age-related macular degeneration, left eye, with active choroidal neovascularization: Secondary | ICD-10-CM | POA: Diagnosis not present

## 2023-12-22 DIAGNOSIS — H353211 Exudative age-related macular degeneration, right eye, with active choroidal neovascularization: Secondary | ICD-10-CM | POA: Diagnosis not present

## 2024-01-13 DIAGNOSIS — I10 Essential (primary) hypertension: Secondary | ICD-10-CM | POA: Diagnosis not present

## 2024-01-13 DIAGNOSIS — I480 Paroxysmal atrial fibrillation: Secondary | ICD-10-CM | POA: Diagnosis not present

## 2024-01-13 DIAGNOSIS — R001 Bradycardia, unspecified: Secondary | ICD-10-CM | POA: Diagnosis not present

## 2024-01-13 DIAGNOSIS — E782 Mixed hyperlipidemia: Secondary | ICD-10-CM | POA: Diagnosis not present

## 2024-01-18 ENCOUNTER — Other Ambulatory Visit: Payer: Self-pay

## 2024-01-18 MED ORDER — METOPROLOL TARTRATE 25 MG PO TABS
25.0000 mg | ORAL_TABLET | Freq: Two times a day (BID) | ORAL | 6 refills | Status: AC
  Filled 2024-01-18: qty 60, 30d supply, fill #0

## 2024-02-16 DIAGNOSIS — H353211 Exudative age-related macular degeneration, right eye, with active choroidal neovascularization: Secondary | ICD-10-CM | POA: Diagnosis not present

## 2024-03-01 ENCOUNTER — Ambulatory Visit: Admitting: Dermatology

## 2024-03-01 ENCOUNTER — Encounter: Payer: Self-pay | Admitting: Dermatology

## 2024-03-01 DIAGNOSIS — L821 Other seborrheic keratosis: Secondary | ICD-10-CM | POA: Diagnosis not present

## 2024-03-01 DIAGNOSIS — L578 Other skin changes due to chronic exposure to nonionizing radiation: Secondary | ICD-10-CM

## 2024-03-01 DIAGNOSIS — L820 Inflamed seborrheic keratosis: Secondary | ICD-10-CM | POA: Diagnosis not present

## 2024-03-01 DIAGNOSIS — L2089 Other atopic dermatitis: Secondary | ICD-10-CM

## 2024-03-01 DIAGNOSIS — L814 Other melanin hyperpigmentation: Secondary | ICD-10-CM

## 2024-03-01 DIAGNOSIS — G2581 Restless legs syndrome: Secondary | ICD-10-CM

## 2024-03-01 DIAGNOSIS — I872 Venous insufficiency (chronic) (peripheral): Secondary | ICD-10-CM

## 2024-03-01 DIAGNOSIS — W908XXA Exposure to other nonionizing radiation, initial encounter: Secondary | ICD-10-CM

## 2024-03-01 DIAGNOSIS — L570 Actinic keratosis: Secondary | ICD-10-CM

## 2024-03-01 DIAGNOSIS — Z79899 Other long term (current) drug therapy: Secondary | ICD-10-CM

## 2024-03-01 DIAGNOSIS — Z7189 Other specified counseling: Secondary | ICD-10-CM

## 2024-03-01 DIAGNOSIS — L209 Atopic dermatitis, unspecified: Secondary | ICD-10-CM | POA: Diagnosis not present

## 2024-03-01 MED ORDER — EUCRISA 2 % EX OINT
1.0000 | TOPICAL_OINTMENT | CUTANEOUS | 3 refills | Status: DC
Start: 2024-03-01 — End: 2024-05-03

## 2024-03-01 NOTE — Progress Notes (Signed)
 Follow-Up Visit   Subjective  Darren Wright is a 89 y.o. male who presents for the following: hx of getting bumps under skin, comes and goes, itchy and feels hot, using biofreeze, pt does have hx of eczema on legs, he also has hx of legs jerking no cramping The patient has spots, moles and lesions to be evaluated, some may be new or changing and the patient may have concern these could be cancer.  The following portions of the chart were reviewed this encounter and updated as appropriate: medications, allergies, medical history  Review of Systems:  No other skin or systemic complaints except as noted in HPI or Assessment and Plan.  Objective  Well appearing patient in no apparent distress; mood and affect are within normal limits.  A focused examination was performed of the following areas: Lower legs, face, scalp  Relevant exam findings are noted in the Assessment and Plan.  scalp, ears x 17 (17) Pink scaly macules Scalp x 3 (3) Stuck on waxy paps with erythema  Assessment & Plan   ATOPIC DERMATITIS (with element of Stasis Dermatitis) Bil lower legs Exam: pink scaly patches scattered on legs R > L 10% BSA Chronic and persistent condition with duration or expected duration over one year. Condition is symptomatic/ bothersome to patient. Not currently at goal. Atopic dermatitis (eczema) is a chronic, relapsing, pruritic condition that can significantly affect quality of life. It is often associated with allergic rhinitis and/or asthma and can require treatment with topical medications, phototherapy, or in severe cases biologic injectable medication (Dupixent; Adbry) or Oral JAK inhibitors. Treatment Plan: Start Eucrisa ointment qd/bid to legs as needed for flares  Recommend gentle skin care.  RESTLESS LEG SYNDROME ? Neuropathy? legs Exam:  Treatment Plan: Recommend patient discuss with PCP  ACTINIC DAMAGE - chronic, secondary to cumulative UV radiation exposure/sun  exposure over time - diffuse scaly erythematous macules with underlying dyspigmentation - Recommend daily broad spectrum sunscreen SPF 30+ to sun-exposed areas, reapply every 2 hours as needed.  - Recommend staying in the shade or wearing long sleeves, sun glasses (UVA+UVB protection) and wide brim hats (4-inch brim around the entire circumference of the hat). - Call for new or changing lesions.   AK (ACTINIC KERATOSIS) (17) scalp, ears x 17 (17) Actinic keratoses are precancerous spots that appear secondary to cumulative UV radiation exposure/sun exposure over time. They are chronic with expected duration over 1 year. A portion of actinic keratoses will progress to squamous cell carcinoma of the skin. It is not possible to reliably predict which spots will progress to skin cancer and so treatment is recommended to prevent development of skin cancer.  Recommend daily broad spectrum sunscreen SPF 30+ to sun-exposed areas, reapply every 2 hours as needed.  Recommend staying in the shade or wearing long sleeves, sun glasses (UVA+UVB protection) and wide brim hats (4-inch brim around the entire circumference of the hat). Call for new or changing lesions. Destruction of lesion - scalp, ears x 17 (17) Complexity: simple   Destruction method: cryotherapy   Informed consent: discussed and consent obtained   Timeout:  patient name, date of birth, surgical site, and procedure verified Lesion destroyed using liquid nitrogen: Yes   Region frozen until ice ball extended beyond lesion: Yes   Outcome: patient tolerated procedure well with no complications   Post-procedure details: wound care instructions given   INFLAMED SEBORRHEIC KERATOSIS (3) Scalp x 3 (3) Symptomatic, irritating, patient would like treated. Destruction of lesion -  Scalp x 3 (3) Complexity: simple   Destruction method: cryotherapy   Informed consent: discussed and consent obtained   Timeout:  patient name, date of birth, surgical  site, and procedure verified Lesion destroyed using liquid nitrogen: Yes   Region frozen until ice ball extended beyond lesion: Yes   Outcome: patient tolerated procedure well with no complications   Post-procedure details: wound care instructions given   ACTINIC SKIN DAMAGE   OTHER ATOPIC DERMATITIS   COUNSELING AND COORDINATION OF CARE   MEDICATION MANAGEMENT   STASIS DERMATITIS OF BOTH LEGS   SEBORRHEIC KERATOSIS   LENTIGO   RESTLESS LEG SYNDROME    SEBORRHEIC KERATOSIS - Stuck-on, waxy, tan-brown papules and/or plaques  - Benign-appearing - Discussed benign etiology and prognosis. - Observe - Call for any changes  LENTIGINES Exam: scattered tan macules Due to sun exposure Treatment Plan: Benign-appearing, observe. Recommend daily broad spectrum sunscreen SPF 30+ to sun-exposed areas, reapply every 2 hours as needed.  Call for any changes  Return for as scheduled for [facility].  I, Ardis Rowan, RMA, am acting as scribe for Armida Sans, MD .   Documentation: I have reviewed the above documentation for accuracy and completeness, and I agree with the above.  Armida Sans, MD

## 2024-03-01 NOTE — Patient Instructions (Addendum)

## 2024-03-04 ENCOUNTER — Other Ambulatory Visit: Payer: Self-pay

## 2024-03-04 MED ORDER — APIXABAN 5 MG PO TABS
5.0000 mg | ORAL_TABLET | Freq: Two times a day (BID) | ORAL | 3 refills | Status: AC
  Filled 2024-03-04: qty 60, 30d supply, fill #0
  Filled 2024-05-12: qty 180, 90d supply, fill #1
  Filled 2024-08-05: qty 180, 90d supply, fill #2
  Filled 2024-11-14: qty 180, 90d supply, fill #3

## 2024-03-09 DIAGNOSIS — Z79899 Other long term (current) drug therapy: Secondary | ICD-10-CM | POA: Diagnosis not present

## 2024-03-09 DIAGNOSIS — E7800 Pure hypercholesterolemia, unspecified: Secondary | ICD-10-CM | POA: Diagnosis not present

## 2024-03-09 DIAGNOSIS — I10 Essential (primary) hypertension: Secondary | ICD-10-CM | POA: Diagnosis not present

## 2024-03-16 DIAGNOSIS — E538 Deficiency of other specified B group vitamins: Secondary | ICD-10-CM | POA: Diagnosis not present

## 2024-03-16 DIAGNOSIS — Z79899 Other long term (current) drug therapy: Secondary | ICD-10-CM | POA: Diagnosis not present

## 2024-03-16 DIAGNOSIS — J431 Panlobular emphysema: Secondary | ICD-10-CM | POA: Diagnosis not present

## 2024-03-16 DIAGNOSIS — I10 Essential (primary) hypertension: Secondary | ICD-10-CM | POA: Diagnosis not present

## 2024-03-16 DIAGNOSIS — I480 Paroxysmal atrial fibrillation: Secondary | ICD-10-CM | POA: Diagnosis not present

## 2024-03-16 DIAGNOSIS — Z0000 Encounter for general adult medical examination without abnormal findings: Secondary | ICD-10-CM | POA: Diagnosis not present

## 2024-03-16 DIAGNOSIS — E782 Mixed hyperlipidemia: Secondary | ICD-10-CM | POA: Diagnosis not present

## 2024-04-08 ENCOUNTER — Ambulatory Visit: Payer: Self-pay | Admitting: Urology

## 2024-04-08 ENCOUNTER — Encounter: Payer: Self-pay | Admitting: Urology

## 2024-04-08 VITALS — BP 163/83 | HR 79 | Ht 71.0 in | Wt 162.0 lb

## 2024-04-08 DIAGNOSIS — D494 Neoplasm of unspecified behavior of bladder: Secondary | ICD-10-CM | POA: Diagnosis not present

## 2024-04-08 LAB — URINALYSIS, COMPLETE
Bilirubin, UA: NEGATIVE
Glucose, UA: NEGATIVE
Ketones, UA: NEGATIVE
Leukocytes,UA: NEGATIVE
Nitrite, UA: NEGATIVE
Protein,UA: NEGATIVE
RBC, UA: NEGATIVE
Specific Gravity, UA: 1.02 (ref 1.005–1.030)
Urobilinogen, Ur: 1 mg/dL (ref 0.2–1.0)
pH, UA: 6 (ref 5.0–7.5)

## 2024-04-08 LAB — MICROSCOPIC EXAMINATION: Bacteria, UA: NONE SEEN

## 2024-04-08 NOTE — Progress Notes (Unsigned)
   04/08/24  CC:  Chief Complaint  Patient presents with   Cysto    Urologic history: TURBT 07/17/2020; >5 cm papillary tumor Pathology low-grade Ta urothelial carcinoma Completed 6-week course intravesical gemcitabine  October 2021 for intermediate risk disease TURBT 12/01/2023; 3 cm papillary tumor right posterior wall Post resection gemcitabine   HPI: Darren Wright has no complaints today.  Denies dysuria or gross hematuria  Blood pressure (!) 156/81, pulse 71, height 5\' 11"  (1.803 m), weight 168 lb (76.2 kg).   Cystoscopy Procedure Note  Patient identification was confirmed, informed consent was obtained, and patient was prepped using Betadine solution.  Lidocaine  jelly was administered per urethral meatus.     Pre-Procedure: - Inspection reveals a normal caliber urethral meatus.  Procedure: The flexible cystoscope was introduced without difficulty - No urethral strictures/lesions are present. - Coapting lateral lobes - Large median lobe  - Bilateral ureteral orifices identified - Bladder mucosa papillary tumor right superolateral region bladder neck at 1:00 - No bladder stones - Moderate trabeculation  Retroflexion shows large, intravesical median lobe and papillary tumor as above   Post-Procedure: - Patient tolerated the procedure well  Assessment/ Plan: Recurrent papillary tumor Findings were discussed and recommend scheduling TURBT The procedure was discussed including potential risks of bleeding, infection and bladder injury.  All questions were answered and he desires to schedule   Geraline Knapp, MD

## 2024-04-11 ENCOUNTER — Other Ambulatory Visit: Payer: Self-pay

## 2024-04-11 DIAGNOSIS — D494 Neoplasm of unspecified behavior of bladder: Secondary | ICD-10-CM

## 2024-04-11 NOTE — Progress Notes (Signed)
 Surgical Physician Order Form [facility]  Dr. Darlynn Elam, MD  * Scheduling expectation : pt pref  *Length of Case: 30 min  *Clearance needed: yes  *Anticoagulation Instructions: hold eliquis   *Aspirin Instructions: N/A  *Post-op visit Date/Instructions:  1-3 day cath removal  *Diagnosis: Bladder Tumor  *Procedure:  TURBT <2cm (60454)   Additional orders: Gemcitabine  2000mg  bladder instillation  -Admit type: OUTpatient  -Anesthesia: Choice  -VTE Prophylaxis Standing Order SCD's       Other:   -Standing Lab Orders Per Anesthesia    Lab other: UA&Urine Culture- ordered  -Standing Test orders EKG/Chest x-ray per Anesthesia       Test other:   - Medications:  Ancef  2gm IV  -Other orders:  N/A

## 2024-04-12 ENCOUNTER — Telehealth: Payer: Self-pay

## 2024-04-12 LAB — CULTURE, URINE COMPREHENSIVE

## 2024-04-12 NOTE — Telephone Encounter (Signed)
 Called patient to schedule surgery. Patient reports that his wife fell and broke her hip. States that she is still hospitalized right now and he will have to move her to rehab afterwards. Asked if I could call back in a few weeks and schedule then. Informed patient this wouldn't be a problem. Will defer for 2 weeks and follow up with patient at that time. Also discussed which medications he is currently on. Patient is no longer taking Pradaxa, I will remove from his med list as well.

## 2024-04-27 ENCOUNTER — Telehealth: Payer: Self-pay

## 2024-04-27 NOTE — Progress Notes (Signed)
  Phone Number: [phone] for Surgical Coordinator Fax Number: [phone]  REQUEST FOR SURGICAL CLEARANCE      Date: 04/27/2024  Faxed to: Barton Like with [facility]  Surgeon: Dr. Darlynn Elam, MD     Date of Surgery: 05/10/2024  Operation: Transurethral Resection of Bladder Tumor with Intravesical Instillation of Gemcitabine    Anesthesia Type: Choice   Diagnosis: Bladder Tumor  Patient Requires:   Cardiac / Vascular Clearance : Yes  Reason: Patient will need to hold Eliquis  prior to surgery   Risk Assessment:    Low   []       Moderate   []     High   []           This patient is optimized for surgery  YES []       NO   []    I recommend further assessment/workup prior to surgery. YES []      NO  []   Appointment scheduled for: _______________________   Further recommendations: ____________________________________     Physician Signature:__________________________________   Printed Name: ________________________________________   Date: _________________

## 2024-04-27 NOTE — Progress Notes (Addendum)
   [facility] Surgical Posting Form  Surgery Date: Date: 05/10/2024  Surgeon: Dr. Darlynn Elam, MD  Inpt ( No  )   Outpt (Yes)   Obs ( No  )   Diagnosis: D49.4 Bladder Tumor  -CPT: 16109, [phone]  Surgery: Transurethral Resection of Bladder Tumor with Intravesical Instillation of Gemcitabine   Stop Anticoagulations: Yes, will need to hold Eliquis   Cardiac/Medical/Pulmonary Clearance needed: Yes   Clearance needed from Dr: Barton Like with [facility]  Clearance request sent on: Date: 04/27/24  *Orders entered into EPIC  Date: 04/27/24   *Case booked in EPIC  Date: 04/26/2024  *Notified pt of Surgery: Date: 04/26/2024  PRE-OP UA & CX: no  *Placed into Prior Authorization Work [place] Date: 04/27/24  Assistant/laser/rep:No

## 2024-04-27 NOTE — Telephone Encounter (Signed)
 Per Dr. Cherylene Corrente, Patient is to be scheduled for Transurethral Resection of Bladder Tumor with Intravesical Instillation of Gemcitabine    Darren Wright was contacted and possible surgical dates were discussed, Tuesday June 10th, 2025 was agreed upon for surgery.   Patient was directed to call [phone] between 1-3pm the day before surgery to find out surgical arrival time.  Instructions were given not to eat or drink from midnight on the night before surgery and have a driver for the day of surgery. On the surgery day patient was instructed to enter through the Medical Mall entrance of [facility] report the Same Day Surgery desk.   Pre-Admit Testing will be in contact via phone to set up an interview with the anesthesia team to review your history and medications prior to surgery.   Reminder of this information was sent via Mail to the patient.

## 2024-05-03 ENCOUNTER — Encounter: Payer: Self-pay | Admitting: Urgent Care

## 2024-05-03 ENCOUNTER — Encounter
Admission: RE | Admit: 2024-05-03 | Discharge: 2024-05-03 | Disposition: A | Discharge status: Home / Self Care | Source: Ambulatory Visit | Attending: Urology | Admitting: Urology

## 2024-05-03 ENCOUNTER — Other Ambulatory Visit: Payer: Self-pay

## 2024-05-03 DIAGNOSIS — E785 Hyperlipidemia, unspecified: Secondary | ICD-10-CM | POA: Insufficient documentation

## 2024-05-03 DIAGNOSIS — Z79899 Other long term (current) drug therapy: Secondary | ICD-10-CM | POA: Diagnosis not present

## 2024-05-03 DIAGNOSIS — I10 Essential (primary) hypertension: Secondary | ICD-10-CM

## 2024-05-03 DIAGNOSIS — D494 Neoplasm of unspecified behavior of bladder: Secondary | ICD-10-CM

## 2024-05-03 DIAGNOSIS — I480 Paroxysmal atrial fibrillation: Secondary | ICD-10-CM | POA: Insufficient documentation

## 2024-05-03 DIAGNOSIS — Z01812 Encounter for preprocedural laboratory examination: Secondary | ICD-10-CM

## 2024-05-03 DIAGNOSIS — Z01818 Encounter for other preprocedural examination: Secondary | ICD-10-CM | POA: Diagnosis not present

## 2024-05-03 DIAGNOSIS — Z01810 Encounter for preprocedural cardiovascular examination: Secondary | ICD-10-CM | POA: Diagnosis not present

## 2024-05-03 HISTORY — DX: Mixed hyperlipidemia: E78.2

## 2024-05-03 HISTORY — DX: Essential (primary) hypertension: I10

## 2024-05-03 LAB — BASIC METABOLIC PANEL WITH GFR
Anion gap: 9 (ref 5–15)
BUN: 20 mg/dL (ref 8–23)
CO2: 25 mmol/L (ref 22–32)
Calcium: 9.1 mg/dL (ref 8.9–10.3)
Chloride: 104 mmol/L (ref 98–111)
Creatinine, Ser: 0.77 mg/dL (ref 0.61–1.24)
GFR, Estimated: >60 mL/min (ref >60)
Glucose, Bld: 97 mg/dL (ref 70–99)
Potassium: 3.7 mmol/L (ref 3.5–5.1)
Sodium: 138 mmol/L (ref 135–145)

## 2024-05-03 NOTE — Patient Instructions (Addendum)
 Your procedure is scheduled on: Tuesday, June 10 Report to the Registration Desk on the 1st floor of the [facility] To find out your arrival time, please call [phone] between 1PM - 3PM on: Monday, June 9 If your arrival time is 6:00 am, do not arrive before that time as the Medical Mall entrance doors do not open until 6:00 am.  REMEMBER: Instructions that are not followed completely may result in serious medical risk, up to and including death; or upon the discretion of your surgeon and anesthesiologist your surgery may need to be rescheduled.  Do not eat or drink after midnight the night before surgery.  No gum chewing or hard candies.  One week prior to surgery: starting today, June 3 Stop Anti-inflammatories (NSAIDS) such as Advil, Aleve, Ibuprofen, Motrin, Naproxen, Naprosyn and Aspirin based products such as Excedrin, Goody's Powder, BC Powder. Stop ANY OVER THE COUNTER supplements until after surgery. Stop preservision AREDS.  You may however, continue to take Tylenol  if needed for pain up until the day of surgery.  apixaban  (ELIQUIS ) - hold 3 days before surgery. Last day to TAKE is Friday, June 6. Resume AFTER surgery per surgeon instruction.  Continue taking all of your other prescription medications up until the day of surgery.  ON THE DAY OF SURGERY ONLY TAKE THESE MEDICATIONS WITH SIPS OF WATER:  finasteride (PROSCAR)  pantoprazole (PROTONIX) timolol (TIMOPTIC) eye drop  No Alcohol for 24 hours before or after surgery.  No Smoking including e-cigarettes for 24 hours before surgery.  No chewable tobacco products for at least 6 hours before surgery.  No nicotine patches on the day of surgery.  Do not use any "recreational" drugs for at least a week (preferably 2 weeks) before your surgery.  Please be advised that the combination of cocaine and anesthesia may have negative outcomes, up to and including death. If you test positive for cocaine, your surgery will  be cancelled.  On the morning of surgery brush your teeth with toothpaste and water, you may rinse your mouth with mouthwash if you wish. Do not swallow any toothpaste or mouthwash.  Do not wear jewelry, make-up, hairpins, clips or nail polish.  For welded (permanent) jewelry: bracelets, anklets, waist bands, etc.  Please have this removed prior to surgery.  If it is not removed, there is a chance that hospital personnel will need to cut it off on the day of surgery.  Do not wear lotions, powders, or perfumes.   Do not shave body hair from the neck down 48 hours before surgery.  Contact lenses, hearing aids and dentures may not be worn into surgery.  Do not bring valuables to the hospital. [facility] is not responsible for any missing/lost belongings or valuables.   Notify your doctor if there is any change in your medical condition (cold, fever, infection).  Wear comfortable clothing (specific to your surgery type) to the hospital.  After surgery, you can help prevent lung complications by doing breathing exercises.  Take deep breaths and cough every 1-2 hours.   If you are being discharged the day of surgery, you will not be allowed to drive home. You will need a responsible individual to drive you home and stay with you for 24 hours after surgery.   If you are taking public transportation, you will need to have a responsible individual with you.  Please call the Pre-admissions Testing Dept. at [phone] if you have any questions about these instructions.  Surgery Visitation Policy:  Patients having surgery or a procedure may have two visitors.  Children under the age of 50 must have an adult with them who is not the patient.

## 2024-05-05 DIAGNOSIS — H40002 Preglaucoma, unspecified, left eye: Secondary | ICD-10-CM | POA: Diagnosis not present

## 2024-05-05 DIAGNOSIS — H40001 Preglaucoma, unspecified, right eye: Secondary | ICD-10-CM | POA: Diagnosis not present

## 2024-05-09 ENCOUNTER — Encounter: Payer: Self-pay | Admitting: Urology

## 2024-05-09 NOTE — Progress Notes (Signed)
 Perioperative / Anesthesia Services  Pre-Admission Testing Clinical Review / Preoperative Anesthesia Consult  Date: 05/09/24  Patient Demographics:  Name: Darren Wright DOB:   [birth year] MRN:   [id number]  Planned Surgical Procedure(s):    Case: [id number] Date/Time: 05/10/24 1145   Procedures:      TURBT (TRANSURETHRAL RESECTION OF BLADDER TUMOR)     INSTILLATION, BLADDER - Gemcitabine    Anesthesia type: General   Diagnosis: Bladder tumor [D49.4]   Pre-op diagnosis: Bladder Tumor   Location: [facility] OR ROOM 10 / [facility]S FOR [facility]   Surgeons: Geraline Knapp, MD     NOTE: Available PAT nursing documentation and vital signs have been reviewed. Clinical nursing staff has updated patient's PMH/PSHx, current medication list, and drug allergies/intolerances to ensure comprehensive history available to assist in medical decision making as it pertains to the aforementioned surgical procedure and anticipated anesthetic course. Extensive review of available clinical information personally performed. [place] PMH and PSHx updated with any diagnoses/procedures that  may have been inadvertently omitted during his intake with the pre-admission testing department's nursing staff.  Clinical Discussion:  Darren Wright is a 89 y.o. male who is submitted for pre-surgical anesthesia review and clearance prior to him undergoing the above procedure. Patient is a Former Smoker (quit 12/1983). Pertinent PMH includes: PAF, aortic atherosclerosis, HTN, HLD, COPD, GERD (on daily PPI), hiatal hernia, PUD, inguinal hernia, BPH, OA, cervical DDD.  Patient is followed by cardiology Parks Bollman, MD). He was last seen in the cardiology clinic on 01/13/2024; notes reviewed. At the time of his clinic visit, patient doing well overall from a cardiovascular perspective. Patient denied any chest pain, shortness of breath, PND, orthopnea, palpitations, significant peripheral edema, weakness, fatigue, vertiginous  symptoms, or presyncope/syncope. Patient with a past medical history significant for cardiovascular diagnoses. Documented physical exam was grossly benign, providing no evidence of acute exacerbation and/or decompensation of the patient's known cardiovascular conditions.  Stress echocardiogram was performed on 04/13/2014 revealing a normal left ventricular systolic function with an EF of >55%.  MPHR of 138 bpm was achieved.  Patient's maximum stress heart rate was 187 bpm; remained asymptomatic. Patient able to achieve 4.60 METS of activity during the study.  There were no valvular abnormalities noted.  Study determined to be normal and low risk.  Patient with an atrial fibrillation diagnosis; CHA2DS2-VASc Score = 4 (age x 2, HTN, vascular disease history). His rate and rhythm are currently being maintained on oral metoprolol  succinate. He is chronically anticoagulated using dabigatran; reported to be compliant with therapy with no evidence or reports of GI bleeding. Insurance coverage of his OAC has been a problem, thus patient is asking to switch to apixaban ; changed per request.  Blood pressure well controlled at 118/76 mmHg on currently prescribed ARB (losartan), diuretic (HCTZ) and beta-blocker (metoprolol  tartrate) therapies. Patient not currently taking any type of lipid-lowering therapies for his HLD diagnosis and ASCVD prevention.  He is not diabetic. Patient does not have an OSAH diagnosis. Patient is able to complete all of his  ADL/IADLs without cardiovascular limitation.  Per the DASI, patient is able to achieve at least 4 METS of physical activity without experiencing any significant degree of angina/anginal equivalent symptoms. No other changes were made to his medication regimen during his visit with cardiology.  Patient scheduled to follow-up with outpatient cardiology in 6 months or sooner if needed.  Darren Wright is scheduled for an elective TURBT (TRANSURETHRAL RESECTION OF BLADDER  TUMOR); INSTILLATION, BLADDER on 05/10/2024  with Dr. Darlynn Elam, MD. Given patient's past medical history significant for cardiovascular diagnoses, presurgical cardiac clearance was sought by the PAT team. Per cardiology, "this patient is optimized for surgery and may proceed with the planned procedural course with a LOW risk of significant perioperative cardiovascular complications".    Again, this patient is on chronic anticoagulation therapy.  He has been instructed on recommendations from his cardiologist for holding his apixaban  dose for 3 days prior to his procedure with plans to restart as soon as postoperative bleeding risk felt to be minimized by his primary attending surgeon.  Patient is aware that his last dose of apixaban  should be on 05/06/2024.  Patient denies previous perioperative complications with anesthesia in the past. In review of the available records, it is noted that patient underwent a general anesthetic course here at [facility] ([facility] in 11/2023 without documented complications.      05/03/2024    9:57 AM 04/08/2024    9:56 AM 12/01/2023   10:14 AM  Vitals with BMI  Height 5\' 11"  5\' 11"    Weight 160 lbs 162 lbs   BMI 22.33 22.6   Systolic  163 173  Diastolic  83 82  Pulse  79 74    Providers/Specialists:   NOTE: Primary physician provider listed below. Patient may have been seen by APP or partner within same practice.   PROVIDER ROLE / SPECIALTY LAST Adan Holms, MD General Surgery (Surgeon) 04/08/2024  Yehuda Helms, MD Primary Care Provider 03/16/2024  Percival Brace, MD Cardiology 01/18/2024   Allergies:  Ibuprofen and Penicillins  Current Home Medications:   No current facility-administered medications for this encounter.    apixaban  (ELIQUIS ) 5 MG TABS tablet   cyanocobalamin  1000 MCG tablet   finasteride (PROSCAR) 5 MG tablet   losartan-hydrochlorothiazide (HYZAAR) 50-12.5 MG tablet    metoprolol  tartrate (LOPRESSOR ) 25 MG tablet   Multiple Vitamins-Minerals (PRESERVISION AREDS 2 PO)   pantoprazole (PROTONIX) 40 MG tablet   tamsulosin (FLOMAX) 0.4 MG CAPS capsule   timolol (TIMOPTIC) 0.5 % ophthalmic solution   History:   Past Medical History:  Diagnosis Date   Anemia    Aortic atherosclerosis (HCC)    Basal cell carcinoma 12/11/2016   left medial cheek   Bladder cancer (HCC) 2021   Bladder tumor    BPH (benign prostatic hyperplasia)    Colon polyp    COPD (chronic obstructive pulmonary disease) (HCC)    DDD (degenerative disc disease), cervical    Diverticulosis    GERD (gastroesophageal reflux disease)    Hepatic cyst    Hiatal hernia    History of angioedema    HLD (hyperlipidemia)    HTN (hypertension)    Inguinal hernia    Macular degeneration    On apixaban  therapy    Osteoarthritis    PAF (paroxysmal atrial fibrillation) (HCC)    a.) CHA2DS2VASc = 4 (age x2, HTN, vascular disease history);  b.) rate/rhythm maintained on oral metoprolol  succinate; chronically anticoagulated with apixaban    Primary hypertension    PUD (peptic ulcer disease)    Sinus bradycardia    Squamous cell carcinoma of skin 03/10/2018   left crown scalp   Past Surgical History:  Procedure Laterality Date   BLADDER INSTILLATION N/A 12/01/2023   Procedure: BLADDER INSTILLATION OF GEMCITABINE ;  Surgeon: Geraline Knapp, MD;  Location: [facility] ORS;  Service: Urology;  Laterality: N/A;   CHOLECYSTECTOMY  2018   COLONOSCOPY  CYSTOSCOPY W/ RETROGRADES Bilateral 07/17/2020   Procedure: CYSTOSCOPY WITH RETROGRADE PYELOGRAM;  Surgeon: Geraline Knapp, MD;  Location: [facility] ORS;  Service: Urology;  Laterality: Bilateral;   NEUROPLASTY VOCAL CORD  Bilateral    ROBOT ASSISTED LAPAROSCOPIC REPAIR INGUINAL HERNIA Bilateral 02/12/2023  Bilateral 02/12/2023   TONSILLECTOMY     TRANSURETHRAL RESECTION OF BLADDER TUMOR N/A 12/01/2023   Procedure: TRANSURETHRAL RESECTION OF BLADDER TUMOR  (TURBT);  Surgeon: Geraline Knapp, MD;  Location: [facility] ORS;  Service: Urology;  Laterality: N/A;   TRANSURETHRAL RESECTION OF BLADDER TUMOR WITH MITOMYCIN -C N/A 07/17/2020   Procedure: TRANSURETHRAL RESECTION OF BLADDER TUMOR WITH gemcitabine ;  Surgeon: Geraline Knapp, MD;  Location: [facility] ORS;  Service: Urology;  Laterality: N/A;   Family History  Problem Relation Age of Onset   Pancreatic cancer Mother    Prostate cancer Father    Breast cancer Sister    Bladder Cancer Sister    Social History   Tobacco Use   Smoking status: Former    Current packs/day: 0.00    Types: Cigarettes    Quit date: 1985    Years since quitting: 40.4   Smokeless tobacco: Never  Vaping Use   Vaping status: Never Used  Substance Use Topics   Alcohol use: Not Currently   Drug use: Never    Pertinent Clinical Results:  LABS:   Component Date Value Ref Range Status   Sodium 05/03/2024 138  135 - 145 mmol/L Final   Potassium 05/03/2024 3.7  3.5 - 5.1 mmol/L Final   Chloride 05/03/2024 104  98 - 111 mmol/L Final   CO2 05/03/2024 25  22 - 32 mmol/L Final   Glucose, Bld 05/03/2024 97  70 - 99 mg/dL Final   Glucose reference range applies only to samples taken after fasting for at least 8 hours.   BUN 05/03/2024 20  8 - 23 mg/dL Final   Creatinine, Ser 05/03/2024 0.77  0.61 - 1.24 mg/dL Final   Calcium 19/14/7829 9.1  8.9 - 10.3 mg/dL Final   GFR, Estimated 05/03/2024 >60  >60 mL/min Final   Comment: (NOTE) Calculated using the CKD-EPI Creatinine Equation (2021)    Anion gap 05/03/2024 9  5 - 15 Final   Performed at [facility], [address] [place], [place] [address]   Component Ref Range & Units 03/09/2024  WBC (White Blood Cell Count) 4.1 - 10.2 10^3/uL 5.2  RBC (Red Blood Cell Count) 4.69 - 6.13 10^6/uL 5.03  Hemoglobin 14.1 - 18.1 gm/dL 08.6 Low   Hematocrit 57.8 - 52.0 % 40.3  MCV (Mean Corpuscular Volume) 80.0 - 100.0 fl 80.1  MCH (Mean Corpuscular Hemoglobin) 27.0  - 31.2 pg 24.9 Low   MCHC (Mean Corpuscular Hemoglobin Concentration) 32.0 - 36.0 gm/dL 31 Low   Platelet Count 150 - 450 10^3/uL 221  RDW-CV (Red Cell Distribution Width) 11.6 - 14.8 % 16.4 High   MPV (Mean Platelet Volume) 9.4 - 12.4 fl 9.5  Neutrophils 1.50 - 7.80 10^3/uL 2.6  Lymphocytes 1.00 - 3.60 10^3/uL 1.79  Monocytes 0.00 - 1.50 10^3/uL 0.52  Eosinophils 0.00 - 0.55 10^3/uL 0.22  Basophils 0.00 - 0.09 10^3/uL 0.04  Neutrophil % 32.0 - 70.0 % 50.1  Lymphocyte % 10.0 - 50.0 % 34.5  Monocyte % 4.0 - 13.0 % 10  Eosinophil % 1.0 - 5.0 % 4.2  Basophil% 0.0 - 2.0 % 0.8  Immature Granulocyte % <=0.7 % 0.4  Immature Granulocyte Count <=0.06 10^3/L 0.02  ECG: Date: 05/03/2024 Time ECG obtained: 1000 AM Rate: 63 bpm Rhythm: normal sinus Axis (leads I and aVF): Normal Intervals: PR 164 ms. QRS 98 ms. QTc 442 ms. ST segment and T wave changes: Nonspecific ST abnormality in inferior leads Comparison: Similar to previous tracing obtained on 11/23/2023   IMAGING / PROCEDURES: CT ABDOMEN PELVIS W CONTRAST performed on 06/11/2020 Evidence of Acute Diverticulitis of the mid sigmoid colon. Mild secondary involvement of adjacent small bowel but no abscess or other complicating features. Recommend Urology follow-up for indeterminate soft tissue thickening at the right base of the bladder and enlarged and heterogeneously enhancing prostate. Moderate size gastric hiatal hernia.  Fat containing right inguinal hernia, partially containing some of the sigmoid mesentery Benign appearing hepatic cysts.  Aortic atherosclerosis  STRESS ECHOCARDIOGRAM performed on 04/13/2014 Normal left ventricular systolic function with an EF of >55% MPHR achieved; max stress heart rate was 187 bpm; achieved 4.60 METS No significant valvular abnormalities noted  Impression and Plan:  Darren Wright has been referred for pre-anesthesia review and clearance prior to him undergoing the  planned anesthetic and procedural courses. Available labs, pertinent testing, and imaging results were personally reviewed by me in preparation for upcoming operative/procedural course. [facility] medical record has been updated following extensive record review and patient interview with PAT staff.   This patient has been appropriately cleared by cardiology with an overall LOW risk of significant perioperative cardiovascular complications. Based on clinical review performed today (05/09/24), barring any significant acute changes in the patient's overall condition, it is anticipated that he will be able to proceed with the planned surgical intervention. Any acute changes in clinical condition may necessitate his procedure being postponed and/or cancelled. Patient will meet with anesthesia team (MD and/or CRNA) on the day of his procedure for preoperative evaluation/assessment. Questions regarding anesthetic course will be fielded at that time.   Pre-surgical instructions were reviewed with the patient during his PAT appointment, and questions were fielded to satisfaction by PAT clinical staff. He has been instructed on which medications that he will need to hold prior to surgery, as well as the ones that have been deemed safe/appropriate to take of the day of his procedure. As part of the general education provided by PAT, patient made aware both verbally and in writing, that he would need to abstain from the use of any illegal substances during his perioperative course.  He was advised that failure to follow the provided instructions could necessitate case cancellation or result serious perioperative complications up to and including death. Patient encouraged to contact PAT and/or his surgeon's office to discuss any questions or concerns that may arise prior to surgery; verbalized understanding.   Renate Caroline, MSN, APRN, FNP-C, CEN [facility]  Peri-operative Services Nurse  Practitioner Phone: [phone] Fax: [phone] 05/09/24 9:32 AM  NOTE: This note has been prepared using Dragon dictation software. Despite my best ability to proofread, there is always the potential that unintentional transcriptional errors may still occur from this process.

## 2024-05-10 ENCOUNTER — Encounter: Payer: Self-pay | Admitting: Urology

## 2024-05-10 ENCOUNTER — Other Ambulatory Visit: Payer: Self-pay

## 2024-05-10 ENCOUNTER — Ambulatory Visit: Payer: Self-pay | Admitting: Urgent Care

## 2024-05-10 ENCOUNTER — Encounter
Admission: RE | Disposition: A | Discharge status: Home / Self Care | Payer: Self-pay | Source: Home / Self Care | Attending: Urology

## 2024-05-10 ENCOUNTER — Ambulatory Visit
Admission: RE | Admit: 2024-05-10 | Discharge: 2024-05-10 | Disposition: A | Discharge status: Home / Self Care | Attending: Urology | Admitting: Urology

## 2024-05-10 DIAGNOSIS — D494 Neoplasm of unspecified behavior of bladder: Secondary | ICD-10-CM

## 2024-05-10 DIAGNOSIS — I10 Essential (primary) hypertension: Secondary | ICD-10-CM | POA: Diagnosis not present

## 2024-05-10 DIAGNOSIS — M5030 Other cervical disc degeneration, unspecified cervical region: Secondary | ICD-10-CM | POA: Insufficient documentation

## 2024-05-10 DIAGNOSIS — C674 Malignant neoplasm of posterior wall of bladder: Secondary | ICD-10-CM | POA: Insufficient documentation

## 2024-05-10 DIAGNOSIS — Z01810 Encounter for preprocedural cardiovascular examination: Secondary | ICD-10-CM

## 2024-05-10 DIAGNOSIS — N323 Diverticulum of bladder: Secondary | ICD-10-CM | POA: Insufficient documentation

## 2024-05-10 DIAGNOSIS — K219 Gastro-esophageal reflux disease without esophagitis: Secondary | ICD-10-CM | POA: Diagnosis not present

## 2024-05-10 DIAGNOSIS — C678 Malignant neoplasm of overlapping sites of bladder: Secondary | ICD-10-CM | POA: Diagnosis not present

## 2024-05-10 DIAGNOSIS — Z8711 Personal history of peptic ulcer disease: Secondary | ICD-10-CM | POA: Diagnosis not present

## 2024-05-10 DIAGNOSIS — I480 Paroxysmal atrial fibrillation: Secondary | ICD-10-CM | POA: Diagnosis not present

## 2024-05-10 DIAGNOSIS — J449 Chronic obstructive pulmonary disease, unspecified: Secondary | ICD-10-CM | POA: Diagnosis not present

## 2024-05-10 DIAGNOSIS — N3289 Other specified disorders of bladder: Secondary | ICD-10-CM | POA: Diagnosis not present

## 2024-05-10 DIAGNOSIS — M1990 Unspecified osteoarthritis, unspecified site: Secondary | ICD-10-CM | POA: Diagnosis not present

## 2024-05-10 DIAGNOSIS — Z7901 Long term (current) use of anticoagulants: Secondary | ICD-10-CM | POA: Insufficient documentation

## 2024-05-10 DIAGNOSIS — C675 Malignant neoplasm of bladder neck: Secondary | ICD-10-CM | POA: Diagnosis not present

## 2024-05-10 DIAGNOSIS — E785 Hyperlipidemia, unspecified: Secondary | ICD-10-CM

## 2024-05-10 DIAGNOSIS — Z87891 Personal history of nicotine dependence: Secondary | ICD-10-CM | POA: Diagnosis not present

## 2024-05-10 DIAGNOSIS — I700 Atherosclerosis of aorta: Secondary | ICD-10-CM | POA: Diagnosis not present

## 2024-05-10 DIAGNOSIS — K449 Diaphragmatic hernia without obstruction or gangrene: Secondary | ICD-10-CM | POA: Insufficient documentation

## 2024-05-10 DIAGNOSIS — N400 Enlarged prostate without lower urinary tract symptoms: Secondary | ICD-10-CM | POA: Diagnosis not present

## 2024-05-10 HISTORY — DX: Long term (current) use of anticoagulants: Z79.01

## 2024-05-10 HISTORY — PX: TRANSURETHRAL RESECTION OF BLADDER TUMOR: SHX2575

## 2024-05-10 HISTORY — DX: Essential (primary) hypertension: I10

## 2024-05-10 HISTORY — DX: Bradycardia, unspecified: R00.1

## 2024-05-10 HISTORY — PX: BLADDER INSTILLATION: SHX6893

## 2024-05-10 SURGERY — TURBT (TRANSURETHRAL RESECTION OF BLADDER TUMOR)
Anesthesia: General | Site: Bladder

## 2024-05-10 MED ORDER — ONDANSETRON HCL 4 MG/2ML IJ SOLN: 4.0000 mg | Freq: Once | INTRAMUSCULAR | Status: DC | PRN

## 2024-05-10 MED ORDER — OXYCODONE HCL 5 MG/5ML PO SOLN: 5.0000 mg | Freq: Once | ORAL | Status: DC | PRN

## 2024-05-10 MED ORDER — LACTATED RINGERS IV SOLN: INTRAVENOUS | Status: DC

## 2024-05-10 MED ORDER — CEFAZOLIN SODIUM-DEXTROSE 2-4 GM/100ML-% IV SOLN
INTRAVENOUS | Status: AC
Start: 2024-05-10 — End: ?
  Filled 2024-05-10: qty 100

## 2024-05-10 MED ORDER — OXYCODONE HCL 5 MG PO TABS: 5.0000 mg | ORAL_TABLET | Freq: Once | ORAL | Status: DC | PRN

## 2024-05-10 MED ORDER — ACETAMINOPHEN 10 MG/ML IV SOLN
INTRAVENOUS | Status: DC | PRN
  Administered 2024-05-10: 1000 mg via INTRAVENOUS

## 2024-05-10 MED ORDER — FENTANYL CITRATE (PF) 100 MCG/2ML IJ SOLN
INTRAMUSCULAR | Status: DC | PRN
  Administered 2024-05-10 (×2): 50 ug via INTRAVENOUS

## 2024-05-10 MED ORDER — GEMCITABINE CHEMO FOR BLADDER INSTILLATION 2000 MG
2000.0000 mg | Freq: Once | INTRAVENOUS | Status: AC
  Administered 2024-05-10: 2000 mg via INTRAVESICAL
  Filled 2024-05-10: qty 2000

## 2024-05-10 MED ORDER — PHENYLEPHRINE 80 MCG/ML (10ML) SYRINGE FOR IV PUSH (FOR BLOOD PRESSURE SUPPORT)
PREFILLED_SYRINGE | INTRAVENOUS | Status: AC
  Filled 2024-05-10: qty 10

## 2024-05-10 MED ORDER — ORAL CARE MOUTH RINSE: 15.0000 mL | Freq: Once | OROMUCOSAL | Status: AC

## 2024-05-10 MED ORDER — PROPOFOL 10 MG/ML IV BOLUS
INTRAVENOUS | Status: DC | PRN
  Administered 2024-05-10: 30 mg via INTRAVENOUS
  Administered 2024-05-10: 100 mg via INTRAVENOUS
  Administered 2024-05-10: 100 ug/kg/min via INTRAVENOUS

## 2024-05-10 MED ORDER — CEFAZOLIN SODIUM-DEXTROSE 2-4 GM/100ML-% IV SOLN
2.0000 g | INTRAVENOUS | Status: AC
  Administered 2024-05-10: 2 g via INTRAVENOUS

## 2024-05-10 MED ORDER — DEXAMETHASONE SODIUM PHOSPHATE 10 MG/ML IJ SOLN
INTRAMUSCULAR | Status: AC
  Filled 2024-05-10: qty 1

## 2024-05-10 MED ORDER — LACTATED RINGERS IV SOLN: INTRAVENOUS | Status: DC | PRN

## 2024-05-10 MED ORDER — GLYCOPYRROLATE 0.2 MG/ML IJ SOLN
INTRAMUSCULAR | Status: AC
  Filled 2024-05-10: qty 1

## 2024-05-10 MED ORDER — SODIUM CHLORIDE 0.9 % IR SOLN
Status: DC | PRN
Start: 2024-05-10 — End: 2024-05-10
  Administered 2024-05-10: 6000 mL
  Administered 2024-05-10: 3000 mL

## 2024-05-10 MED ORDER — CHLORHEXIDINE GLUCONATE 0.12 % MT SOLN
15.0000 mL | Freq: Once | OROMUCOSAL | Status: AC
  Administered 2024-05-10: 15 mL via OROMUCOSAL

## 2024-05-10 MED ORDER — EPHEDRINE SULFATE-NACL 50-0.9 MG/10ML-% IV SOSY
PREFILLED_SYRINGE | INTRAVENOUS | Status: DC | PRN
  Administered 2024-05-10 (×2): 10 mg via INTRAVENOUS

## 2024-05-10 MED ORDER — KETOROLAC TROMETHAMINE 30 MG/ML IJ SOLN
INTRAMUSCULAR | Status: AC
  Filled 2024-05-10: qty 1

## 2024-05-10 MED ORDER — CHLORHEXIDINE GLUCONATE 0.12 % MT SOLN
OROMUCOSAL | Status: AC
  Filled 2024-05-10: qty 15

## 2024-05-10 MED ORDER — LIDOCAINE HCL (CARDIAC) PF 100 MG/5ML IV SOSY
PREFILLED_SYRINGE | INTRAVENOUS | Status: DC | PRN
  Administered 2024-05-10: 100 mg via INTRAVENOUS

## 2024-05-10 MED ORDER — ONDANSETRON HCL 4 MG/2ML IJ SOLN
INTRAMUSCULAR | Status: AC
  Filled 2024-05-10: qty 2

## 2024-05-10 MED ORDER — PHENYLEPHRINE 80 MCG/ML (10ML) SYRINGE FOR IV PUSH (FOR BLOOD PRESSURE SUPPORT)
PREFILLED_SYRINGE | INTRAVENOUS | Status: DC | PRN
  Administered 2024-05-10: 80 ug via INTRAVENOUS

## 2024-05-10 MED ORDER — GLYCOPYRROLATE 0.2 MG/ML IJ SOLN
INTRAMUSCULAR | Status: DC | PRN
  Administered 2024-05-10: .2 mg via INTRAVENOUS

## 2024-05-10 MED ORDER — ACETAMINOPHEN 10 MG/ML IV SOLN: 1000.0000 mg | Freq: Once | INTRAVENOUS | Status: DC | PRN

## 2024-05-10 MED ORDER — FENTANYL CITRATE (PF) 100 MCG/2ML IJ SOLN
INTRAMUSCULAR | Status: AC
Start: 2024-05-10 — End: ?
  Filled 2024-05-10: qty 2

## 2024-05-10 MED ORDER — LIDOCAINE HCL (PF) 2 % IJ SOLN
INTRAMUSCULAR | Status: AC
  Filled 2024-05-10: qty 5

## 2024-05-10 MED ORDER — FENTANYL CITRATE (PF) 100 MCG/2ML IJ SOLN: 25.0000 ug | INTRAMUSCULAR | Status: DC | PRN

## 2024-05-10 MED ORDER — PROPOFOL 10 MG/ML IV BOLUS
INTRAVENOUS | Status: AC
  Filled 2024-05-10: qty 60

## 2024-05-10 MED ORDER — EPHEDRINE 5 MG/ML INJ
INTRAVENOUS | Status: AC
  Filled 2024-05-10: qty 5

## 2024-05-10 MED ORDER — STERILE WATER FOR IRRIGATION IR SOLN
Status: DC | PRN
  Administered 2024-05-10: 1000 mL

## 2024-05-10 SURGICAL SUPPLY — 18 items
BAG DRAIN SIEMENS DORNER NS (MISCELLANEOUS) ×1 IMPLANT
BAG URINE DRAIN 2000ML AR STRL (UROLOGICAL SUPPLIES) ×1 IMPLANT
CATH FOLEY 2WAY 18X30 (CATHETERS) IMPLANT
DRAPE UTILITY 15X26 TOWEL STRL (DRAPES) ×1 IMPLANT
DRSG TELFA 3X4 N-ADH STERILE (GAUZE/BANDAGES/DRESSINGS) ×1 IMPLANT
ELECTRODE LOOP 22F BIPOLAR SML (ELECTROSURGICAL) IMPLANT
ELECTRODE REM PT RTRN 9FT ADLT (ELECTROSURGICAL) IMPLANT
GLOVE BIOGEL PI IND STRL 7.5 (GLOVE) ×1 IMPLANT
GOWN STRL REUS W/ TWL LRG LVL3 (GOWN DISPOSABLE) ×1 IMPLANT
GOWN STRL REUS W/TWL XL LVL4 (GOWN DISPOSABLE) ×1 IMPLANT
KIT TURNOVER CYSTO (KITS) ×1 IMPLANT
LOOP CUT BIPOLAR 24F LRG (ELECTROSURGICAL) IMPLANT
PACK CYSTO AR (MISCELLANEOUS) ×1 IMPLANT
SET IRRIG Y TYPE TUR BLADDER L (SET/KITS/TRAYS/PACK) ×1 IMPLANT
SOL .9 NS 3000ML IRR UROMATIC (IV SOLUTION) ×2 IMPLANT
SURGILUBE 2OZ TUBE FLIPTOP (MISCELLANEOUS) ×1 IMPLANT
SYRINGE TOOMEY IRRIG 70ML (MISCELLANEOUS) ×1 IMPLANT
WATER STERILE IRR 500ML POUR (IV SOLUTION) ×1 IMPLANT

## 2024-05-10 NOTE — Transfer of Care (Signed)
 Immediate Anesthesia Transfer of Care Note  Patient: Darren Wright  Procedure(s) Performed: TURBT (TRANSURETHRAL RESECTION OF BLADDER TUMOR) (Bladder) INSTILLATION, BLADDER (Bladder)  Patient Location: PACU  Anesthesia Type:General  Level of Consciousness: sedated  Airway & Oxygen Therapy: Patient Spontanous Breathing  Post-op Assessment: Report given to RN and Post -op Vital signs reviewed and stable  Post vital signs: Reviewed and stable  Last Vitals:  Vitals Value Taken Time  BP 114/53 05/10/24 1200  Temp    Pulse 81 05/10/24 1202  Resp 17 05/10/24 1202  SpO2 93 % 05/10/24 1202  Vitals shown include unfiled device data.  Last Pain:  Vitals:   05/10/24 1030  TempSrc: Temporal  PainSc: 0-No pain         Complications: There were no known notable events for this encounter.

## 2024-05-10 NOTE — Op Note (Signed)
 Preoperative diagnosis: Bladder tumor (< 2 cm)  Postoperative diagnosis:  Bladder tumor (< 2 cm)  Procedure:  Cystoscopy Transurethral resection of bladder tumor (< 2 cm) Instillation intravesical gemcitabine    Surgeon: Geralyn Knee C. Ignazio Kincaid, M.D.  Anesthesia: General  Complications: None  Intraoperative findings:  Cystoscopy: Urethra normal in caliber without stricture.  Prominent trilobar enlargement with hypervascularity.  1 cm papillary tumor anterior bladder neck at 12:00.  1 cm papillary tumor right posterior wall.  Heavy bladder trabeculation with cellules and diverticula  EBL: Minimal  Specimens: Anterior bladder neck tumor Posterior wall tumor      Indication: Darren Wright is a 89 y.o male with a history of low-grade urothelial carcinoma the bladder.  Refer to the admission H&P for details.  Recent surveillance cystoscopy with recurrent tumor at bladder neck.  After reviewing the management options for treatment, he elected to proceed with the above surgical procedure(s). We have discussed the potential benefits and risks of the procedure, side effects of the proposed treatment, the likelihood of the patient achieving the goals of the procedure, and any potential problems that might occur during the procedure or recuperation. Informed consent has been obtained.  Description of procedure:  The patient was taken to the operating room and general anesthesia was induced.  The patient was placed in the dorsal lithotomy position, prepped and draped in the usual sterile fashion, and preoperative antibiotics were administered. A preoperative time-out was performed.   A 24 French continuous-flow resectoscope sheath with obturator was lubricated and passed per urethra.  The obturator was removed and replaced with an Darren Wright resectoscope with loop.  Panendoscopy was performed with findings as described above.  The bladder neck tumor was resected in its entirety using bipolar  current.  Hemostasis was obtained with cautery.  The specimen was removed from the bladder via irrigation.  The posterior wall tumor was then resected in a similar fashion.  Hemostasis was obtained with cautery.  Careful repeat cystoscopy was performed and no additional tumor was identified and hemostasis was noted to be adequate.  The UOs were well away from resection sites and were normal in appearance at the completion of the procedure.  The resectoscope was removed and a 63F Foley catheter was placed without difficulty and return of clear effluent.  The balloon was inflated with 10 mL of sterile water.    2000 mg of intravesical gemcitabine  was instilled to the bladder.  This was allowed to dwell in the PACU for 1 hour.  This was well-tolerated.  After 1 hour, the catheter was drained and removed.   Brihanna Devenport C. Cherylene Corrente,  MD

## 2024-05-10 NOTE — Anesthesia Postprocedure Evaluation (Signed)
 Anesthesia Post Note  Patient: Darren Wright  Procedure(s) Performed: TURBT (TRANSURETHRAL RESECTION OF BLADDER TUMOR) (Bladder) INSTILLATION, BLADDER (Bladder)  Patient location during evaluation: PACU Anesthesia Type: General Level of consciousness: awake and alert, oriented and patient cooperative Pain management: pain level controlled Vital Signs Assessment: post-procedure vital signs reviewed and stable Respiratory status: spontaneous breathing, nonlabored ventilation and respiratory function stable Cardiovascular status: blood pressure returned to baseline and stable Postop Assessment: adequate PO intake Anesthetic complications: no   There were no known notable events for this encounter.   Last Vitals:  Vitals:   05/10/24 1030 05/10/24 1200  BP: (!) 199/82 (!) 114/53  Pulse: 73 79  Resp: 17 14  Temp: 36.5 C (!) 36.2 C  SpO2: 94% 96%    Last Pain:  Vitals:   05/10/24 1200  TempSrc:   PainSc: Asleep                 Dorothey Gate

## 2024-05-10 NOTE — Discharge Instructions (Signed)
 Transurethral Resection of Bladder Tumor (TURBT) or Bladder Biopsy   Definition:  Transurethral Resection of the Bladder Tumor is a surgical procedure used to diagnose and remove tumors within the bladder.   General instructions:     Your recent bladder surgery requires very little post hospital care but some definite precautions.  Despite the fact that no skin incisions were used, the area around the bladder incisions are raw and covered with scabs to promote healing and prevent bleeding. Certain precautions are needed to insure that the scabs are not disturbed over the next 2-4 weeks while the healing proceeds.  Because the raw surface inside your bladder and the irritating effects of urine you may expect frequency of urination and/or urgency (a stronger desire to urinate) and perhaps even getting up at night more often. This will usually resolve or improve slowly over the healing period. You may see some blood in your urine over the first 6 weeks. Do not be alarmed, even if the urine was clear for a while. Get off your feet and drink lots of fluids until clearing occurs. If you start to pass clots or don't improve call us .  Diet:  You may return to your normal diet immediately. Because of the raw surface of your bladder, alcohol, spicy foods, foods high in acid and drinks with caffeine may cause irritation or frequency and should be used in moderation. To keep your urine flowing freely and avoid constipation, drink plenty of fluids during the day (8-10 glasses). Tip: Avoid cranberry juice because it is very acidic.  Activity:  Your physical activity doesn't need to be restricted. However, if you are very active, you may see some blood in the urine. We suggest that you reduce your activity under the circumstances until the bleeding has stopped.  Bowels:  It is important to keep your bowels regular during the postoperative period. Straining with bowel movements can cause bleeding. A bowel  movement every other day is reasonable. Use a mild laxative if needed, such as milk of magnesia 2-3 tablespoons, or 2 Dulcolax tablets. Call if you continue to have problems. If you had been taking narcotics for pain, before, during or after your surgery, you may be constipated. Take a laxative if necessary.    Medication:  You should resume your pre-surgery medications unless told not to. In addition you may be given an antibiotic to prevent or treat infection. Antibiotics are not always necessary. All medication should be taken as prescribed until the bottles are finished unless you are having an unusual reaction to one of the drugs.  You may resume Eliquis  05/11/24   [facility] [address] [place], [place] [address] [phone]

## 2024-05-10 NOTE — Interval H&P Note (Signed)
 History and Physical Interval Note:  05/10/2024 11:01 AM  Darren Wright  has presented today for surgery, with the diagnosis of Bladder Tumor.  The various methods of treatment have been discussed with the patient and family. After consideration of risks, benefits and other options for treatment, the patient has consented to  Procedure(s) with comments: TURBT (TRANSURETHRAL RESECTION OF BLADDER TUMOR) (N/A) INSTILLATION, BLADDER (N/A) - Gemcitabine  as a surgical intervention.  The patient's history has been reviewed, patient examined, no change in status, stable for surgery.  I have reviewed the patient's chart and labs.  Questions were answered to the patient's satisfaction.     Arcelia Pals C Gabriellah Rabel

## 2024-05-10 NOTE — H&P (Signed)
 Urology H&P  Urologic history: TURBT 07/17/2020; >5 cm papillary tumor Pathology low-grade Ta urothelial carcinoma Completed 6-week course intravesical gemcitabine  October 2021 for intermediate risk disease TURBT 12/01/2023; 3 cm papillary tumor right posterior wall Post resection gemcitabine    Assessment/Plan:  89 year old male with a history of recurrent urothelial carcinoma the bladder found on recent cystoscopy to have a papillary tumor right bladder neck Scheduled for TURBT.  The procedure has been discussed in detail including potential risks of bleeding, infection/sepsis and bladder injury.  All questions were answered and he desires to proceed   History of Present Illness: Darren Wright is a 89 y.o. with the above urologic history.  Recent surveillance cystoscopy remarkable for a recurrent bladder tumor at the bladder neck.  He presents for TURBT  Past Medical History:  Diagnosis Date   Anemia    Aortic atherosclerosis (HCC)    Basal cell carcinoma 12/11/2016   left medial cheek   Bladder cancer (HCC) 2021   Bladder tumor    BPH (benign prostatic hyperplasia)    Colon polyp    COPD (chronic obstructive pulmonary disease) (HCC)    DDD (degenerative disc disease), cervical    Diverticulosis    GERD (gastroesophageal reflux disease)    Hepatic cyst    Hiatal hernia    History of angioedema    HLD (hyperlipidemia)    HTN (hypertension)    Inguinal hernia    Macular degeneration    On apixaban  therapy    Osteoarthritis    PAF (paroxysmal atrial fibrillation) (HCC)    a.) CHA2DS2VASc = 4 (age x2, HTN, vascular disease history);  b.) rate/rhythm maintained on oral metoprolol  tartrate; chronically anticoagulated with apixaban    Primary hypertension    PUD (peptic ulcer disease)    Sinus bradycardia    Squamous cell carcinoma of skin 03/10/2018   left crown scalp    Past Surgical History:  Procedure Laterality Date   BLADDER INSTILLATION N/A 12/01/2023    Procedure: BLADDER INSTILLATION OF GEMCITABINE ;  Surgeon: Geraline Knapp, MD;  Location: [facility];  Service: Urology;  Laterality: N/A;   CHOLECYSTECTOMY  2018   COLONOSCOPY     CYSTOSCOPY W/ RETROGRADES Bilateral 07/17/2020   Procedure: CYSTOSCOPY WITH RETROGRADE PYELOGRAM;  Surgeon: Geraline Knapp, MD;  Location: [facility];  Service: Urology;  Laterality: Bilateral;   NEUROPLASTY VOCAL CORD  Bilateral    ROBOT ASSISTED LAPAROSCOPIC REPAIR INGUINAL HERNIA Bilateral 02/12/2023  Bilateral 02/12/2023   TONSILLECTOMY     TRANSURETHRAL RESECTION OF BLADDER TUMOR N/A 12/01/2023   Procedure: TRANSURETHRAL RESECTION OF BLADDER TUMOR (TURBT);  Surgeon: Geraline Knapp, MD;  Location: [facility];  Service: Urology;  Laterality: N/A;   TRANSURETHRAL RESECTION OF BLADDER TUMOR WITH MITOMYCIN -C N/A 07/17/2020   Procedure: TRANSURETHRAL RESECTION OF BLADDER TUMOR WITH gemcitabine ;  Surgeon: Geraline Knapp, MD;  Location: [facility];  Service: Urology;  Laterality: N/A;    Home Medications:  Current Meds  Medication Sig   apixaban  (ELIQUIS ) 5 MG TABS tablet Take 1 tablet (5 mg total) by mouth every 12 (twelve) hours.   cyanocobalamin  1000 MCG tablet Take 1,000 mcg by mouth daily.    finasteride (PROSCAR) 5 MG tablet Take 5 mg by mouth daily.   losartan-hydrochlorothiazide (HYZAAR) 50-12.5 MG tablet Take 1 tablet by mouth daily.   metoprolol  tartrate (LOPRESSOR ) 25 MG tablet Take 1 tablet (25 mg total) by mouth 2 (two) times daily. (Patient taking differently: Take 25 mg by mouth 2 (two) times daily  as needed (A-Fib).)   Multiple Vitamins-Minerals (PRESERVISION AREDS 2 PO) Take 1 capsule by mouth in the morning and at bedtime.    pantoprazole (PROTONIX) 40 MG tablet Take 40 mg by mouth daily.    tamsulosin (FLOMAX) 0.4 MG CAPS capsule Take 0.4 mg by mouth at bedtime.    timolol (TIMOPTIC) 0.5 % ophthalmic solution Place 1 drop into the right eye daily.   [DISCONTINUED] Crisaborole  (EUCRISA ) 2 % OINT  Apply 1 Application topically as directed. qd to bid to rash on legs as needed for flares    Allergies:  Allergies  Allergen Reactions   Ibuprofen Hives   Penicillins Swelling    Swelling 1 week after having injection. Unsure if it was really the cause.    Family History  Problem Relation Age of Onset   Pancreatic cancer Mother    Prostate cancer Father    Breast cancer Sister    Bladder Cancer Sister     Social History:  reports that he quit smoking about 40 years ago. His smoking use included cigarettes. He has never used smokeless tobacco. He reports that he does not currently use alcohol. He reports that he does not use drugs.  ROS: No fever, chills, chest pain, shortness of breath  Physical Exam:  Vital signs in last 24 hours: Temp:  [97.7 F (36.5 C)] 97.7 F (36.5 C) (06/10 1030) Pulse Rate:  [73] 73 (06/10 1030) Resp:  [17] 17 (06/10 1030) BP: (199)/(82) 199/82 (06/10 1030) SpO2:  [94 %] 94 % (06/10 1030) Weight:  [72.6 kg] 72.6 kg (06/10 1030) Constitutional:  Alert and oriented, No acute distress HEENT: [place] AT Cardiovascular: Regular rate and rhythm Respiratory: Normal respiratory effort, lungs clear bilaterally Psychiatric: Normal mood and affect   Laboratory Data:  No results for input(s): "WBC", "HGB", "HCT" in the last 72 hours. No results for input(s): "NA", "K", "CL", "CO2", "GLUCOSE", "BUN", "CREATININE", "CALCIUM" in the last 72 hours. No results for input(s): "LABPT", "INR" in the last 72 hours. No results for input(s): "LABURIN" in the last 72 hours. Results for orders placed or performed in visit on 04/08/24  Microscopic Examination     Status: None   Collection Time: 04/08/24  9:46 AM   Urine  Result Value Ref Range Status   WBC, UA 0-5 0 - 5 /hpf Final   RBC, Urine 0-2 0 - 2 /hpf Final   Epithelial Cells (non renal) 0-10 0 - 10 /hpf Final   Bacteria, UA None seen None seen/Few Final  CULTURE, URINE COMPREHENSIVE     Status: None    Collection Time: 04/08/24  3:51 PM   Specimen: Urine   UR  Result Value Ref Range Status   Urine Culture, Comprehensive Final report  Final   Organism ID, Bacteria Comment  Final    Comment: Mixed urogenital flora 6,000  Colonies/mL      05/10/2024, 10:42 AM  Darlynn Elam,  MD

## 2024-05-10 NOTE — Anesthesia Preprocedure Evaluation (Addendum)
 Anesthesia Evaluation  Patient identified by MRN, date of birth, ID band Patient awake    Reviewed: Allergy & Precautions, NPO status , Patient's Chart, lab work & pertinent test results  History of Anesthesia Complications Negative for: history of anesthetic complications  Airway Mallampati: IV   Neck ROM: Full    Dental  (+) Missing   Pulmonary COPD, former smoker (quit 1985)   Pulmonary exam normal breath sounds clear to auscultation       Cardiovascular hypertension, Normal cardiovascular exam+ dysrhythmias (a fib on Eliquis )  Rhythm:Regular Rate:Normal  ECG 05/03/24: normal   Neuro/Psych negative neurological ROS     GI/Hepatic PUD,GERD  ,,  Endo/Other  negative endocrine ROS    Renal/GU      Musculoskeletal  (+) Arthritis ,    Abdominal   Peds  Hematology   Anesthesia Other Findings Reviewed and agree with Donnetta Gains pre-anesthesia clinical review note.    Cardiology note 01/13/24:  89 y.o. male with  1. Primary hypertension  2. Paroxysmal atrial fibrillation (CMS/HHS-HCC)  3. Bradycardia, sinus  4. Mixed hyperlipidemia   89 year old gentleman with paroxysmal atrial fibrillation, chads vasc score 3, currently on Pradaxa, which is tolerated well without bleeding side effects. He appears asymptomatic.The patient has essential hypertension, blood pressure mildly elevated today, though better controlled at home, on current BP medications. The patient has hyperlipidemia, with good control of LDL cholesterol on diet therapy.   Plan   1. Switch to Eliquis  for stroke prevention due to cost and patient's wishes 2. Continue low-sodium diet 3. Recommend Mediterranean Diet 4. Continue regular exercise as tolerated 5. Return to clinic for follow-up in 6 months   No orders of the defined types were placed in this encounter.  Return in about 6 months (around 07/12/2024).    Reproductive/Obstetrics                              Anesthesia Physical Anesthesia Plan  ASA: 3  Anesthesia Plan: General   Post-op Pain Management:    Induction: Intravenous  PONV Risk Score and Plan: 2 and Ondansetron , Dexamethasone  and Treatment may vary due to age or medical condition  Airway Management Planned: LMA  Additional Equipment:   Intra-op Plan:   Post-operative Plan: Extubation in OR  Informed Consent: I have reviewed the patients History and Physical, chart, labs and discussed the procedure including the risks, benefits and alternatives for the proposed anesthesia with the patient or authorized representative who has indicated his/her understanding and acceptance.     Dental advisory given  Plan Discussed with: CRNA  Anesthesia Plan Comments: (Patient consented for risks of anesthesia including but not limited to:  - adverse reactions to medications - damage to eyes, teeth, lips or other oral mucosa - nerve damage due to positioning  - sore throat or hoarseness - damage to heart, brain, nerves, lungs, other parts of body or loss of life  Informed patient about role of CRNA in peri- and intra-operative care.  Patient voiced understanding.)        Anesthesia Quick Evaluation

## 2024-05-11 ENCOUNTER — Ambulatory Visit (INDEPENDENT_AMBULATORY_CARE_PROVIDER_SITE_OTHER): Admitting: Urology

## 2024-05-11 ENCOUNTER — Encounter: Payer: Self-pay | Admitting: Urology

## 2024-05-11 DIAGNOSIS — R339 Retention of urine, unspecified: Secondary | ICD-10-CM

## 2024-05-11 LAB — SURGICAL PATHOLOGY

## 2024-05-11 NOTE — Progress Notes (Signed)
 05/11/2024 7:58 AM   Darren Wright [birth year] [id number]  Referring provider: Yehuda Helms, MD [address] [facility] [place],  [place] [address]  Chief Complaint  Patient presents with   Urinary Retention    HPI: Darren Wright is a 89 y.o. male status post TURBT yesterday.  He had no problems voiding yesterday and states he filled up a urinal prior to bedtime however had onset of urinary retention after midnight.  He has not had significant hematuria.  He is on tamsulosin and finasteride.   PMH: Past Medical History:  Diagnosis Date   Anemia    Aortic atherosclerosis (HCC)    Basal cell carcinoma 12/11/2016   left medial cheek   Bladder cancer (HCC) 2021   Bladder tumor    BPH (benign prostatic hyperplasia)    Colon polyp    COPD (chronic obstructive pulmonary disease) (HCC)    DDD (degenerative disc disease), cervical    Diverticulosis    GERD (gastroesophageal reflux disease)    Hepatic cyst    Hiatal hernia    History of angioedema    HLD (hyperlipidemia)    HTN (hypertension)    Inguinal hernia    Macular degeneration    On apixaban  therapy    Osteoarthritis    PAF (paroxysmal atrial fibrillation) (HCC)    a.) CHA2DS2VASc = 4 (age x2, HTN, vascular disease history);  b.) rate/rhythm maintained on oral metoprolol  tartrate; chronically anticoagulated with apixaban    Primary hypertension    PUD (peptic ulcer disease)    Sinus bradycardia    Squamous cell carcinoma of skin 03/10/2018   left crown scalp    Surgical History: Past Surgical History:  Procedure Laterality Date   BLADDER INSTILLATION N/A 12/01/2023   Procedure: BLADDER INSTILLATION OF GEMCITABINE ;  Surgeon: Geraline Knapp, MD;  Location: [facility];  Service: Urology;  Laterality: N/A;   BLADDER INSTILLATION N/A 05/10/2024   Procedure: INSTILLATION, BLADDER;  Surgeon: Geraline Knapp, MD;  Location: [facility];  Service: Urology;  Laterality: N/A;  Gemcitabine     CHOLECYSTECTOMY  2018   COLONOSCOPY     CYSTOSCOPY W/ RETROGRADES Bilateral 07/17/2020   Procedure: CYSTOSCOPY WITH RETROGRADE PYELOGRAM;  Surgeon: Geraline Knapp, MD;  Location: [facility];  Service: Urology;  Laterality: Bilateral;   NEUROPLASTY VOCAL CORD  Bilateral    ROBOT ASSISTED LAPAROSCOPIC REPAIR INGUINAL HERNIA Bilateral 02/12/2023  Bilateral 02/12/2023   TONSILLECTOMY     TRANSURETHRAL RESECTION OF BLADDER TUMOR N/A 12/01/2023   Procedure: TRANSURETHRAL RESECTION OF BLADDER TUMOR (TURBT);  Surgeon: Geraline Knapp, MD;  Location: [facility];  Service: Urology;  Laterality: N/A;   TRANSURETHRAL RESECTION OF BLADDER TUMOR N/A 05/10/2024   Procedure: TURBT (TRANSURETHRAL RESECTION OF BLADDER TUMOR);  Surgeon: Geraline Knapp, MD;  Location: [facility];  Service: Urology;  Laterality: N/A;   TRANSURETHRAL RESECTION OF BLADDER TUMOR WITH MITOMYCIN -C N/A 07/17/2020   Procedure: TRANSURETHRAL RESECTION OF BLADDER TUMOR WITH gemcitabine ;  Surgeon: Geraline Knapp, MD;  Location: [facility];  Service: Urology;  Laterality: N/A;    Home Medications:  Allergies as of 05/11/2024       Reactions   Ibuprofen Hives   Penicillins Swelling   Swelling 1 week after having injection. Unsure if it was really the cause.        Medication List        Accurate as of May 11, 2024  7:58 AM. If you have any questions, ask your nurse or  doctor.          cyanocobalamin  1000 MCG tablet Take 1,000 mcg by mouth daily.   Eliquis  5 MG Tabs tablet Generic drug: apixaban  Take 1 tablet (5 mg total) by mouth every 12 (twelve) hours.   finasteride 5 MG tablet Commonly known as: PROSCAR Take 5 mg by mouth daily.   losartan-hydrochlorothiazide 50-12.5 MG tablet Commonly known as: HYZAAR Take 1 tablet by mouth daily.   metoprolol  tartrate 25 MG tablet Commonly known as: LOPRESSOR  Take 1 tablet (25 mg total) by mouth 2 (two) times daily. What changed:  when to take this reasons to take this    pantoprazole 40 MG tablet Commonly known as: PROTONIX Take 40 mg by mouth daily.   PRESERVISION AREDS 2 PO Take 1 capsule by mouth in the morning and at bedtime.   tamsulosin 0.4 MG Caps capsule Commonly known as: FLOMAX Take 0.4 mg by mouth at bedtime.   timolol 0.5 % ophthalmic solution Commonly known as: TIMOPTIC Place 1 drop into the right eye daily.        Allergies:  Allergies  Allergen Reactions   Ibuprofen Hives   Penicillins Swelling    Swelling 1 week after having injection. Unsure if it was really the cause.    Family History: Family History  Problem Relation Age of Onset   Pancreatic cancer Mother    Prostate cancer Father    Breast cancer Sister    Bladder Cancer Sister     Social History:  reports that he quit smoking about 40 years ago. His smoking use included cigarettes. He has never used smokeless tobacco. He reports that he does not currently use alcohol. He reports that he does not use drugs.  ROS: Noncontributory except as per the HPI  Physical Exam: Constitutional:  Alert, mild distress secondary to urinary retention HEENT: [place] AT Trachea midline, no masses. Respiratory: Normal respiratory effort, no increased work of breathing. GI: Abdomen is soft, nontender, nondistended, no abdominal masses Psychiatric: Normal mood and affect.   Assessment & Plan:    1. Urinary retention (Primary) A 26F coud catheter was placed without difficulty with return of ~800 cc blood-tinged urine Will leave catheter indwelling until 6/16 and he will follow-up for catheter removal/voiding trial   Geraline Knapp, MD  [facility] [address] [place], [place] [address] [phone]

## 2024-05-11 NOTE — Progress Notes (Signed)
 Simple Catheter Placement  Due to urinary retention patient is present today for a foley cath placement.  Patient was cleaned and prepped in a sterile fashion with betadine and 2% lidocaine  jelly was instilled into the urethra. A 16coude  FR foley catheter was inserted, urine return was noted  , urine was ldark yellow  in color.  The balloon was filled with 10cc of sterile water.  A night bag was attached for drainage. Patient was also given a night bag to take home and was given instruction on how to change from one bag to another.  Patient was given instruction on proper catheter care.  Patient tolerated well, no complications were noted   Performed by: Theodosia Fishman CMA

## 2024-05-12 ENCOUNTER — Ambulatory Visit: Payer: Self-pay | Admitting: Urology

## 2024-05-12 ENCOUNTER — Other Ambulatory Visit: Payer: Self-pay

## 2024-05-12 ENCOUNTER — Telehealth: Payer: Self-pay | Admitting: Urology

## 2024-05-12 MED ORDER — TAMSULOSIN HCL 0.4 MG PO CAPS
0.4000 mg | ORAL_CAPSULE | Freq: Every day | ORAL | 3 refills | Status: AC
  Filled 2024-05-12: qty 90, 90d supply, fill #0

## 2024-05-12 NOTE — Telephone Encounter (Signed)
 Darren Wright called back today.  We discussed his path report showing recurrent low-grade noninvasive urothelial carcinoma.  He has been treated with a 6-week course of gemcitabine  for intermediate risk disease.  He also got a single postresection dose with his most recent surgery.  We discussed options of continuing surveillance versus alternative intravesical therapies.  He indicated he did not have a preference and will schedule a 49-month follow-up cystoscopy since he did receive postresection gemcitabine .  He has a follow-up appointment 6/16 for catheter removal/voiding trial.

## 2024-05-12 NOTE — Telephone Encounter (Signed)
 Contacted patient to discuss bladder pathology report.  Left VM to contact office

## 2024-05-16 ENCOUNTER — Encounter: Payer: Self-pay | Admitting: Physician Assistant

## 2024-05-16 ENCOUNTER — Ambulatory Visit: Admitting: Physician Assistant

## 2024-05-16 VITALS — Wt 160.0 lb

## 2024-05-16 DIAGNOSIS — R339 Retention of urine, unspecified: Secondary | ICD-10-CM

## 2024-05-16 LAB — BLADDER SCAN AMB NON-IMAGING

## 2024-05-16 NOTE — Progress Notes (Signed)
 Catheter Removal  Patient is present today for a catheter removal.  9ml of water  was drained from the balloon. A 16FR foley cath was removed from the bladder, no complications were noted. Patient tolerated well.  Performed by: Jenny Mohs, CMA  Follow up/ Additional notes: appt this afternoon 05/16/2024 @ 3:20pm

## 2024-05-16 NOTE — Progress Notes (Signed)
 Afternoon follow-up  Patient returned to clinic this afternoon for PVR. He has been able to urinate without difficulty. PVR >171mL.  Results for orders placed or performed in visit on 05/16/24  BLADDER SCAN AMB NON-IMAGING   Collection Time: 05/16/24  3:20 PM  Result Value Ref Range   Scan Result >166ml     Voiding trial passed. Counseled patient to keep plans for cysto in 3 months.  Follow up: 3 month cysto

## 2024-05-30 DIAGNOSIS — H353231 Exudative age-related macular degeneration, bilateral, with active choroidal neovascularization: Secondary | ICD-10-CM | POA: Diagnosis not present

## 2024-07-01 DIAGNOSIS — H353231 Exudative age-related macular degeneration, bilateral, with active choroidal neovascularization: Secondary | ICD-10-CM | POA: Diagnosis not present

## 2024-07-01 DIAGNOSIS — H401134 Primary open-angle glaucoma, bilateral, indeterminate stage: Secondary | ICD-10-CM | POA: Diagnosis not present

## 2024-07-01 DIAGNOSIS — Z961 Presence of intraocular lens: Secondary | ICD-10-CM | POA: Diagnosis not present

## 2024-07-27 ENCOUNTER — Ambulatory Visit: Payer: PPO | Admitting: Dermatology

## 2024-08-05 ENCOUNTER — Other Ambulatory Visit: Payer: Self-pay

## 2024-08-08 ENCOUNTER — Encounter: Payer: Self-pay | Admitting: Dermatology

## 2024-08-08 ENCOUNTER — Ambulatory Visit: Admitting: Dermatology

## 2024-08-08 DIAGNOSIS — D1801 Hemangioma of skin and subcutaneous tissue: Secondary | ICD-10-CM

## 2024-08-08 DIAGNOSIS — L578 Other skin changes due to chronic exposure to nonionizing radiation: Secondary | ICD-10-CM | POA: Diagnosis not present

## 2024-08-08 DIAGNOSIS — L814 Other melanin hyperpigmentation: Secondary | ICD-10-CM | POA: Diagnosis not present

## 2024-08-08 DIAGNOSIS — L821 Other seborrheic keratosis: Secondary | ICD-10-CM | POA: Diagnosis not present

## 2024-08-08 DIAGNOSIS — L570 Actinic keratosis: Secondary | ICD-10-CM

## 2024-08-08 DIAGNOSIS — W908XXA Exposure to other nonionizing radiation, initial encounter: Secondary | ICD-10-CM | POA: Diagnosis not present

## 2024-08-08 DIAGNOSIS — D692 Other nonthrombocytopenic purpura: Secondary | ICD-10-CM | POA: Diagnosis not present

## 2024-08-08 DIAGNOSIS — L820 Inflamed seborrheic keratosis: Secondary | ICD-10-CM

## 2024-08-08 DIAGNOSIS — Z7189 Other specified counseling: Secondary | ICD-10-CM

## 2024-08-08 DIAGNOSIS — Z1283 Encounter for screening for malignant neoplasm of skin: Secondary | ICD-10-CM | POA: Diagnosis not present

## 2024-08-08 DIAGNOSIS — D229 Melanocytic nevi, unspecified: Secondary | ICD-10-CM

## 2024-08-08 DIAGNOSIS — Z8589 Personal history of malignant neoplasm of other organs and systems: Secondary | ICD-10-CM

## 2024-08-08 DIAGNOSIS — Z85828 Personal history of other malignant neoplasm of skin: Secondary | ICD-10-CM

## 2024-08-08 DIAGNOSIS — Z79899 Other long term (current) drug therapy: Secondary | ICD-10-CM

## 2024-08-08 NOTE — Progress Notes (Signed)
 Follow-Up Visit   Subjective  Darren Wright is a 89 y.o. male who presents for the following: Skin Cancer Screening and Upper Body Skin Exam, hx of BCC, hx of SCC  The patient presents for Upper Body Skin Exam (UBSE) for skin cancer screening and mole check. The patient has spots, moles and lesions to be evaluated, some may be new or changing and the patient may have concern these could be cancer.  Wife with patient   The following portions of the chart were reviewed this encounter and updated as appropriate: medications, allergies, medical history  Review of Systems:  No other skin or systemic complaints except as noted in HPI or Assessment and Plan.  Objective  Well appearing patient in no apparent distress; mood and affect are within normal limits.  All skin waist up examined. Relevant physical exam findings are noted in the Assessment and Plan.  scalp, face ears x 17 (17) Erythematous thin papules/macules with gritty scale.  arms, hands x 7 (7) Stuck-on, waxy, tan-brown papules and plaques -- Discussed benign etiology and prognosis.   Assessment & Plan   AK (ACTINIC KERATOSIS) (17) scalp, face ears x 17 (17) ACTINIC DAMAGE - chronic, secondary to cumulative UV radiation exposure/sun exposure over time - diffuse scaly erythematous macules with underlying dyspigmentation - Recommend daily broad spectrum sunscreen SPF 30+ to sun-exposed areas, reapply every 2 hours as needed.  - Recommend staying in the shade or wearing long sleeves, sun glasses (UVA+UVB protection) and wide brim hats (4-inch brim around the entire circumference of the hat). - Call for new or changing lesions.  Destruction of lesion - scalp, face ears x 17 (17) Complexity: simple   Destruction method: cryotherapy   Informed consent: discussed and consent obtained   Timeout:  patient name, date of birth, surgical site, and procedure verified Lesion destroyed using liquid nitrogen: Yes   Region frozen  until ice ball extended beyond lesion: Yes   Outcome: patient tolerated procedure well with no complications   Post-procedure details: wound care instructions given    INFLAMED SEBORRHEIC KERATOSIS (7) arms, hands x 7 (7) Symptomatic, irritating, patient would like treated.  Destruction of lesion - arms, hands x 7 (7) Complexity: simple   Destruction method: cryotherapy   Informed consent: discussed and consent obtained   Timeout:  patient name, date of birth, surgical site, and procedure verified Lesion destroyed using liquid nitrogen: Yes   Region frozen until ice ball extended beyond lesion: Yes   Outcome: patient tolerated procedure well with no complications   Post-procedure details: wound care instructions given    Skin cancer screening performed today.  Actinic Damage - Chronic condition, secondary to cumulative UV/sun exposure - diffuse scaly erythematous macules with underlying dyspigmentation - Recommend daily broad spectrum sunscreen SPF 30+ to sun-exposed areas, reapply every 2 hours as needed.  - Staying in the shade or wearing long sleeves, sun glasses (UVA+UVB protection) and wide brim hats (4-inch brim around the entire circumference of the hat) are also recommended for sun protection.  - Call for new or changing lesions.  Lentigines, Seborrheic Keratoses, Hemangiomas - Benign normal skin lesions - Benign-appearing - Call for any changes  Melanocytic Nevi - Tan-brown and/or pink-flesh-colored symmetric macules and papules - Benign appearing on exam today - Observation - Call clinic for new or changing moles - Recommend daily use of broad spectrum spf 30+ sunscreen to sun-exposed areas.    Purpura - Chronic; persistent and recurrent.  Treatable, but not curable. -  Violaceous macules and patches - Benign - Related to trauma, age, sun damage and/or use of blood thinners, chronic use of topical and/or oral steroids - Observe - Can use OTC arnica containing  moisturizer such as Dermend Bruise Formula if desired - Call for worsening or other concerns   HISTORY OF BASAL CELL CARCINOMA OF THE SKIN - No evidence of recurrence today - Recommend regular full body skin exams - Recommend daily broad spectrum sunscreen SPF 30+ to sun-exposed areas, reapply every 2 hours as needed.  - Call if any new or changing lesions are noted between office visits   HISTORY OF SQUAMOUS CELL CARCINOMA OF THE SKIN - No evidence of recurrence today - No lymphadenopathy - Recommend regular full body skin exams - Recommend daily broad spectrum sunscreen SPF 30+ to sun-exposed areas, reapply every 2 hours as needed.  - Call if any new or changing lesions are noted between office visits    Return in about 1 year (around 08/08/2025) for UBSE, hx of BCC, hx of SCC.  IFay Kirks, CMA, am acting as scribe for Alm Rhyme, MD .   Documentation: I have reviewed the above documentation for accuracy and completeness, and I agree with the above.  Alm Rhyme, MD

## 2024-08-08 NOTE — Patient Instructions (Addendum)
 Recommend Dermend Fragile Skin Formula twice a day. This should be used with sunscreen since it can make you more sensitive to the sun.          Cryotherapy Aftercare  Wash gently with soap and water  everyday.   Apply Vaseline and Band-Aid daily until healed.      Due to recent changes in healthcare laws, you may see results of your pathology and/or laboratory studies on MyChart before the doctors have had a chance to review them. We understand that in some cases there may be results that are confusing or concerning to you. Please understand that not all results are received at the same time and often the doctors may need to interpret multiple results in order to provide you with the best plan of care or course of treatment. Therefore, we ask that you please give us  2 business days to thoroughly review all your results before contacting the office for clarification. Should we see a critical lab result, you will be contacted sooner.   If You Need Anything After Your Visit  If you have any questions or concerns for your doctor, please call our main line at [phone] and press option 4 to reach your doctor's medical assistant. If no one answers, please leave a voicemail as directed and we will return your call as soon as possible. Messages left after 4 pm will be answered the following business day.   You may also send us  a message via MyChart. We typically respond to MyChart messages within 1-2 business days.  For prescription refills, please ask your pharmacy to contact our office. Our fax number is [phone].  If you have an urgent issue when the clinic is closed that cannot wait until the next business day, you can page your doctor at the number below.    Please note that while we do our best to be available for urgent issues outside of office hours, we are not available 24/7.   If you have an urgent issue and are unable to reach us , you may choose to seek medical care at your  doctor's office, retail clinic, urgent care center, or emergency room.  If you have a medical emergency, please immediately call 911 or go to the emergency department.  Pager Numbers  - Dr. Hester: [phone]  - Dr. Jackquline: [phone]  - Dr. Claudene: [phone]   - Dr. Raymund: [phone]  In the event of inclement weather, please call our main line at [phone] for an update on the status of any delays or closures.  Dermatology Medication Tips: Please keep the boxes that topical medications come in in order to help keep track of the instructions about where and how to use these. Pharmacies typically print the medication instructions only on the boxes and not directly on the medication tubes.   If your medication is too expensive, please contact our office at [phone] option 4 or send us  a message through MyChart.   We are unable to tell what your co-pay for medications will be in advance as this is different depending on your insurance coverage. However, we may be able to find a substitute medication at lower cost or fill out paperwork to get insurance to cover a needed medication.   If a prior authorization is required to get your medication covered by your insurance company, please allow us  1-2 business days to complete this process.  Drug prices often vary depending on where the prescription is filled and some pharmacies may offer  cheaper prices.  The website www.goodrx.com contains coupons for medications through different pharmacies. The prices here do not account for what the cost may be with help from insurance (it may be cheaper with your insurance), but the website can give you the price if you did not use any insurance.  - You can print the associated coupon and take it with your prescription to the pharmacy.  - You may also stop by our office during regular business hours and pick up a GoodRx coupon card.  - If you need your prescription sent electronically to  a different pharmacy, notify our office through [facility] or by phone at [phone] option 4.     Si Usted Necesita Algo Despus de Su Visita  Tambin puede enviarnos un mensaje a travs de Clinical cytogeneticist. Por lo general respondemos a los mensajes de MyChart en el transcurso de 1 a 2 das hbiles.  Para renovar recetas, por favor pida a su farmacia que se ponga en contacto con nuestra oficina. Randi lakes de fax es [place] [phone].  Si tiene un asunto urgente cuando la clnica est cerrada y que no puede esperar hasta el siguiente da hbil, puede llamar/localizar a su doctor(a) al nmero que aparece a continuacin.   Por favor, tenga en cuenta que aunque hacemos todo lo posible para estar disponibles para asuntos urgentes fuera del horario de [place], no estamos disponibles las 24 horas del da, los 7 [address]  [address] de la [place].   Si tiene un problema urgente y no puede comunicarse con nosotros, puede optar por buscar atencin mdica  en el consultorio de su doctor(a), en una clnica privada, en un centro de atencin urgente o en una sala de emergencias.  Si tiene Engineer, drilling, por favor llame inmediatamente al 911 o vaya a la sala de emergencias.  Nmeros de bper  - Dr. Hester: [phone]  - Dra. Jackquline: [phone]  - Dr. Claudene: [phone]  - Dra. Kitts: [phone]  En caso de inclemencias del [place], por favor llame a nuestra lnea principal al [phone] para una actualizacin sobre el estado de cualquier retraso o cierre.  Consejos para la medicacin en dermatologa: Por favor, guarde las cajas en las que vienen los medicamentos de uso tpico para ayudarle a seguir las instrucciones sobre dnde y cmo usarlos. Las farmacias generalmente imprimen las instrucciones del medicamento slo en las cajas y no directamente en los tubos del [place].   Si su medicamento es muy caro, por favor, pngase en contacto con landry rieger llamando al [phone] y  presione la opcin 4 o envenos un mensaje a travs de Clinical cytogeneticist.   No podemos decirle cul ser su copago por los medicamentos por adelantado ya que esto es diferente dependiendo de la cobertura de su seguro. Sin embargo, es posible que podamos encontrar un medicamento sustituto a Audiological scientist un formulario para que el seguro cubra el medicamento que se considera necesario.   Si se requiere una autorizacin previa para que su compaa de seguros malta su medicamento, por favor permtanos de 1 a 2 das hbiles para completar este proceso.  Los precios de los medicamentos varan con frecuencia dependiendo del Environmental consultant de dnde se surte la receta y alguna farmacias pueden ofrecer precios ms baratos.  El sitio web www.goodrx.com tiene cupones para medicamentos de Health and safety inspector. Los precios aqu no tienen en cuenta lo que podra costar con la ayuda del seguro (puede ser ms barato con su seguro), pero el sitio web puede darle el  precio si no Visual merchandiser.  - Puede imprimir el cupn correspondiente y llevarlo con su receta a la farmacia.  - Tambin puede pasar por nuestra oficina durante el horario de atencin regular y Education officer, museum una tarjeta de cupones de GoodRx.  - Si necesita que su receta se enve electrnicamente a una farmacia diferente, informe a nuestra oficina a travs de MyChart de [place] o por telfono llamando al [phone] y presione la opcin 4.

## 2024-08-10 ENCOUNTER — Encounter: Payer: Self-pay | Admitting: Urology

## 2024-08-10 ENCOUNTER — Ambulatory Visit: Admitting: Urology

## 2024-08-10 VITALS — BP 155/87 | HR 86 | Ht 71.0 in | Wt 160.0 lb

## 2024-08-10 DIAGNOSIS — Z8551 Personal history of malignant neoplasm of bladder: Secondary | ICD-10-CM | POA: Diagnosis not present

## 2024-08-10 DIAGNOSIS — D494 Neoplasm of unspecified behavior of bladder: Secondary | ICD-10-CM | POA: Diagnosis not present

## 2024-08-10 LAB — URINALYSIS, COMPLETE
Bilirubin, UA: NEGATIVE
Glucose, UA: NEGATIVE
Ketones, UA: NEGATIVE
Nitrite, UA: NEGATIVE
Protein,UA: NEGATIVE
Specific Gravity, UA: 1.01 (ref 1.005–1.030)
Urobilinogen, Ur: 0.2 mg/dL (ref 0.2–1.0)
pH, UA: 6 (ref 5.0–7.5)

## 2024-08-10 LAB — MICROSCOPIC EXAMINATION

## 2024-08-10 NOTE — Progress Notes (Signed)
   08/10/24  CC:  Chief Complaint  Patient presents with   Cysto    Urologic history: TURBT 07/17/2020; >5 cm papillary tumor Pathology low-grade Ta urothelial carcinoma Completed 6-week course intravesical gemcitabine  October 2021 for intermediate risk disease TURBT 12/01/2023; 3 cm papillary tumor right posterior wall; post resection gemcitabine .  Path: Ta low-grade urothelial carcinoma TURBT 05/10/2024 1 cm papillary tumor anterior bladder neck and 1 cm papillary tumor right posterior wall; post resection gemcitabine ; pathology Ta low-grade urothelial carcinoma  HPI: Mr. Baughman has no complaints today.  Denies dysuria or gross hematuria  Blood pressure (!) 156/81, pulse 71, height 5' 11 (1.803 m), weight 168 lb (76.2 kg).   Cystoscopy Procedure Note  Patient identification was confirmed, informed consent was obtained, and patient was prepped using Betadine solution.  Lidocaine  jelly was administered per urethral meatus.     Pre-Procedure: - Inspection reveals a normal caliber urethral meatus.  Procedure: The flexible cystoscope was introduced without difficulty - No urethral strictures/lesions are present. - Coapting lateral lobes - Large median lobe  - Bilateral ureteral orifices identified - No solid or papillary tumors identified; no mucosal abnormalities - No bladder stones - Moderate-severe trabeculation  Retroflexion shows large, intravesical median lobe and no tumor   Post-Procedure: - Patient tolerated the procedure well  Assessment/ Plan: No recurrent tumor identified Follow-up surveillance cystoscopy 1 year   Glendia JAYSON Barba, MD

## 2024-08-11 DIAGNOSIS — Z23 Encounter for immunization: Secondary | ICD-10-CM | POA: Diagnosis not present

## 2024-08-11 DIAGNOSIS — E782 Mixed hyperlipidemia: Secondary | ICD-10-CM | POA: Diagnosis not present

## 2024-08-11 DIAGNOSIS — I10 Essential (primary) hypertension: Secondary | ICD-10-CM | POA: Diagnosis not present

## 2024-08-11 DIAGNOSIS — I480 Paroxysmal atrial fibrillation: Secondary | ICD-10-CM | POA: Diagnosis not present

## 2024-08-13 ENCOUNTER — Other Ambulatory Visit: Payer: Self-pay

## 2024-08-23 DIAGNOSIS — H353211 Exudative age-related macular degeneration, right eye, with active choroidal neovascularization: Secondary | ICD-10-CM | POA: Diagnosis not present

## 2024-08-23 DIAGNOSIS — H353221 Exudative age-related macular degeneration, left eye, with active choroidal neovascularization: Secondary | ICD-10-CM | POA: Diagnosis not present

## 2024-09-05 ENCOUNTER — Other Ambulatory Visit: Payer: Self-pay

## 2024-09-05 MED ORDER — COMIRNATY 30 MCG/0.3ML IM SUSY
0.3000 mL | PREFILLED_SYRINGE | Freq: Once | INTRAMUSCULAR | 0 refills | Status: AC
Start: 2024-09-05 — End: 2024-09-06
  Filled 2024-09-05: qty 0.3, 1d supply, fill #0

## 2024-09-05 MED ORDER — FLUZONE HIGH-DOSE 0.5 ML IM SUSY
0.5000 mL | PREFILLED_SYRINGE | Freq: Once | INTRAMUSCULAR | 0 refills | Status: AC
  Filled 2024-09-05: qty 0.5, 1d supply, fill #0

## 2024-09-08 DIAGNOSIS — E782 Mixed hyperlipidemia: Secondary | ICD-10-CM | POA: Diagnosis not present

## 2024-09-08 DIAGNOSIS — Z79899 Other long term (current) drug therapy: Secondary | ICD-10-CM | POA: Diagnosis not present

## 2024-09-08 DIAGNOSIS — I10 Essential (primary) hypertension: Secondary | ICD-10-CM | POA: Diagnosis not present

## 2024-09-15 DIAGNOSIS — D649 Anemia, unspecified: Secondary | ICD-10-CM | POA: Diagnosis not present

## 2024-09-15 DIAGNOSIS — I480 Paroxysmal atrial fibrillation: Secondary | ICD-10-CM | POA: Diagnosis not present

## 2024-09-15 DIAGNOSIS — E538 Deficiency of other specified B group vitamins: Secondary | ICD-10-CM | POA: Diagnosis not present

## 2024-09-15 DIAGNOSIS — J431 Panlobular emphysema: Secondary | ICD-10-CM | POA: Diagnosis not present

## 2024-09-15 DIAGNOSIS — E782 Mixed hyperlipidemia: Secondary | ICD-10-CM | POA: Diagnosis not present

## 2024-09-15 DIAGNOSIS — I10 Essential (primary) hypertension: Secondary | ICD-10-CM | POA: Diagnosis not present

## 2024-09-15 DIAGNOSIS — Z79899 Other long term (current) drug therapy: Secondary | ICD-10-CM | POA: Diagnosis not present

## 2024-11-14 ENCOUNTER — Other Ambulatory Visit: Payer: Self-pay

## 2024-11-18 ENCOUNTER — Other Ambulatory Visit: Payer: Self-pay

## 2025-08-11 ENCOUNTER — Other Ambulatory Visit: Admitting: Urology

## 2025-08-14 ENCOUNTER — Ambulatory Visit: Admitting: Dermatology
